# Patient Record
Sex: Female | Born: 1990 | Race: Black or African American | Hispanic: No | Marital: Single | Smoking: Current some day smoker
Health system: Southern US, Community
[De-identification: ages and names within clinical notes are randomized; demographics above are authoritative.]

## PROBLEM LIST (undated history)

## (undated) ENCOUNTER — Emergency Department: Admission: EM | Payer: Medicaid Other

## (undated) DIAGNOSIS — F319 Bipolar disorder, unspecified: Secondary | ICD-10-CM

## (undated) DIAGNOSIS — G4730 Sleep apnea, unspecified: Secondary | ICD-10-CM

## (undated) DIAGNOSIS — I471 Supraventricular tachycardia, unspecified: Secondary | ICD-10-CM

## (undated) DIAGNOSIS — F32A Depression, unspecified: Secondary | ICD-10-CM

## (undated) DIAGNOSIS — D649 Anemia, unspecified: Secondary | ICD-10-CM

## (undated) DIAGNOSIS — S92341K Displaced fracture of fourth metatarsal bone, right foot, subsequent encounter for fracture with nonunion: Secondary | ICD-10-CM

## (undated) DIAGNOSIS — F419 Anxiety disorder, unspecified: Secondary | ICD-10-CM

## (undated) DIAGNOSIS — R42 Dizziness and giddiness: Secondary | ICD-10-CM

## (undated) DIAGNOSIS — G43909 Migraine, unspecified, not intractable, without status migrainosus: Secondary | ICD-10-CM

## (undated) DIAGNOSIS — R110 Nausea: Secondary | ICD-10-CM

## (undated) DIAGNOSIS — I219 Acute myocardial infarction, unspecified: Secondary | ICD-10-CM

## (undated) DIAGNOSIS — K625 Hemorrhage of anus and rectum: Secondary | ICD-10-CM

## (undated) DIAGNOSIS — K219 Gastro-esophageal reflux disease without esophagitis: Secondary | ICD-10-CM

## (undated) DIAGNOSIS — T884XXA Failed or difficult intubation, initial encounter: Secondary | ICD-10-CM

## (undated) DIAGNOSIS — R55 Syncope and collapse: Secondary | ICD-10-CM

## (undated) HISTORY — DX: Hemorrhage of anus and rectum: K62.5

## (undated) HISTORY — DX: Syncope and collapse: R55

## (undated) HISTORY — DX: Sleep apnea, unspecified: G47.30

## (undated) HISTORY — DX: Supraventricular tachycardia, unspecified: I47.10

## (undated) HISTORY — DX: Nausea: R11.0

## (undated) HISTORY — DX: Migraine, unspecified, not intractable, without status migrainosus: G43.909

## (undated) HISTORY — PX: TONSILLECTOMY: SUR1361

## (undated) HISTORY — DX: Dizziness and giddiness: R42

## (undated) HISTORY — PX: WISDOM TOOTH EXTRACTION: SHX21

## (undated) HISTORY — DX: Acute myocardial infarction, unspecified: I21.9

---

## 2018-04-25 DIAGNOSIS — G43909 Migraine, unspecified, not intractable, without status migrainosus: Secondary | ICD-10-CM | POA: Insufficient documentation

## 2018-04-25 DIAGNOSIS — F418 Other specified anxiety disorders: Secondary | ICD-10-CM | POA: Insufficient documentation

## 2018-04-25 DIAGNOSIS — Z6841 Body Mass Index (BMI) 40.0 and over, adult: Secondary | ICD-10-CM | POA: Insufficient documentation

## 2018-04-25 LAB — HM HIV SCREENING LAB: HM HIV Screening: NEGATIVE

## 2018-04-27 LAB — HM PAP SMEAR

## 2018-06-12 ENCOUNTER — Encounter: Payer: Self-pay | Admitting: Emergency Medicine

## 2018-06-12 ENCOUNTER — Other Ambulatory Visit: Payer: Self-pay

## 2018-06-12 ENCOUNTER — Emergency Department
Admission: EM | Admit: 2018-06-12 | Discharge: 2018-06-12 | Disposition: A | Discharge status: Home / Self Care | Payer: Medicaid Other | Attending: Emergency Medicine | Admitting: Emergency Medicine

## 2018-06-12 ENCOUNTER — Emergency Department: Payer: Medicaid Other

## 2018-06-12 DIAGNOSIS — O9989 Other specified diseases and conditions complicating pregnancy, childbirth and the puerperium: Secondary | ICD-10-CM | POA: Insufficient documentation

## 2018-06-12 DIAGNOSIS — R51 Headache: Secondary | ICD-10-CM | POA: Diagnosis not present

## 2018-06-12 DIAGNOSIS — R109 Unspecified abdominal pain: Secondary | ICD-10-CM | POA: Diagnosis not present

## 2018-06-12 DIAGNOSIS — R1010 Upper abdominal pain, unspecified: Secondary | ICD-10-CM

## 2018-06-12 DIAGNOSIS — Z3A01 Less than 8 weeks gestation of pregnancy: Secondary | ICD-10-CM | POA: Insufficient documentation

## 2018-06-12 DIAGNOSIS — F17210 Nicotine dependence, cigarettes, uncomplicated: Secondary | ICD-10-CM | POA: Insufficient documentation

## 2018-06-12 DIAGNOSIS — O99331 Smoking (tobacco) complicating pregnancy, first trimester: Secondary | ICD-10-CM | POA: Insufficient documentation

## 2018-06-12 DIAGNOSIS — R519 Headache, unspecified: Secondary | ICD-10-CM

## 2018-06-12 DIAGNOSIS — Z3491 Encounter for supervision of normal pregnancy, unspecified, first trimester: Secondary | ICD-10-CM

## 2018-06-12 LAB — COMPREHENSIVE METABOLIC PANEL
ALBUMIN: 3.6 g/dL (ref 3.5–5.0)
ALK PHOS: 71 U/L (ref 38–126)
ALT: 17 U/L (ref 0–44)
ANION GAP: 8 (ref 5–15)
AST: 20 U/L (ref 15–41)
BUN: 11 mg/dL (ref 6–20)
CO2: 26 mmol/L (ref 22–32)
Calcium: 8.6 mg/dL — ABNORMAL LOW (ref 8.9–10.3)
Chloride: 105 mmol/L (ref 98–111)
Creatinine, Ser: 0.83 mg/dL (ref 0.44–1.00)
GFR calc non Af Amer: >60 mL/min (ref >60)
GLUCOSE: 109 mg/dL — AB (ref 70–99)
Potassium: 3.5 mmol/L (ref 3.5–5.1)
Sodium: 139 mmol/L (ref 135–145)
TOTAL PROTEIN: 7.5 g/dL (ref 6.5–8.1)
Total Bilirubin: 0.5 mg/dL (ref 0.3–1.2)

## 2018-06-12 LAB — CBC WITH DIFFERENTIAL/PLATELET
Abs Immature Granulocytes: 0.03 10*3/uL (ref 0.00–0.07)
Basophils Absolute: 0.1 10*3/uL (ref 0.0–0.1)
Basophils Relative: 1 %
EOS PCT: 1 %
Eosinophils Absolute: 0.1 10*3/uL (ref 0.0–0.5)
HEMATOCRIT: 38.4 % (ref 36.0–46.0)
HEMOGLOBIN: 12.4 g/dL (ref 12.0–15.0)
Immature Granulocytes: 0 %
LYMPHS ABS: 2.9 10*3/uL (ref 0.7–4.0)
LYMPHS PCT: 39 %
MCH: 29 pg (ref 26.0–34.0)
MCHC: 32.3 g/dL (ref 30.0–36.0)
MCV: 89.7 fL (ref 80.0–100.0)
MONO ABS: 0.4 10*3/uL (ref 0.1–1.0)
Monocytes Relative: 6 %
Neutro Abs: 4 10*3/uL (ref 1.7–7.7)
Neutrophils Relative %: 53 %
Platelets: 334 10*3/uL (ref 150–400)
RBC: 4.28 MIL/uL (ref 3.87–5.11)
RDW: 14.6 % (ref 11.5–15.5)
WBC: 7.5 10*3/uL (ref 4.0–10.5)
nRBC: 0 % (ref 0.0–0.2)

## 2018-06-12 LAB — URINALYSIS, COMPLETE (UACMP) WITH MICROSCOPIC
Bacteria, UA: NONE SEEN
Bilirubin Urine: NEGATIVE
GLUCOSE, UA: NEGATIVE mg/dL
Hgb urine dipstick: NEGATIVE
Ketones, ur: NEGATIVE mg/dL
Leukocytes, UA: NEGATIVE
Nitrite: NEGATIVE
PROTEIN: NEGATIVE mg/dL
Specific Gravity, Urine: 1.029 (ref 1.005–1.030)
pH: 5 (ref 5.0–8.0)

## 2018-06-12 LAB — HCG, QUANTITATIVE, PREGNANCY: hCG, Beta Chain, Quant, S: 1574 m[IU]/mL — ABNORMAL HIGH (ref <5)

## 2018-06-12 LAB — POCT PREGNANCY, URINE: Preg Test, Ur: POSITIVE — AB

## 2018-06-12 LAB — LIPASE, BLOOD: Lipase: 23 U/L (ref 11–51)

## 2018-06-12 MED ORDER — METOCLOPRAMIDE HCL 10 MG PO TABS: 10.0000 mg | ORAL_TABLET | Freq: Three times a day (TID) | ORAL | 1 refills | Status: DC | PRN

## 2018-06-12 MED ORDER — RANITIDINE HCL 150 MG PO TABS: 150.0000 mg | ORAL_TABLET | Freq: Two times a day (BID) | ORAL | 1 refills | Status: DC

## 2018-06-12 MED ORDER — METOCLOPRAMIDE HCL 5 MG/ML IJ SOLN
10.0000 mg | Freq: Once | INTRAMUSCULAR | Status: AC
  Administered 2018-06-12: 10 mg via INTRAVENOUS
  Filled 2018-06-12: qty 2

## 2018-06-12 MED ORDER — SODIUM CHLORIDE 0.9 % IV SOLN
Freq: Once | INTRAVENOUS | Status: AC
  Administered 2018-06-12: 11:00:00 via INTRAVENOUS

## 2018-06-12 NOTE — ED Provider Notes (Addendum)
[facility], [facility] Emergency Department Provider Note       Time seen: ----------------------------------------- 10:07 AM on 06/12/2018 -----------------------------------------   I have reviewed the triage vital signs and the nursing notes.  HISTORY   Chief Complaint Abdominal Pain and Emesis    HPI Maria Espinoza is a 27 y.o. female with a history of tonsillectomy who presents to the ED for abdominal cramping.  Patient reports abdominal cramping and headache for the past 3 days.  She denies nausea, vomiting or diarrhea.  She denies any urinary symptoms, has reported a small amount of vaginal discharge.  Currently takes birth control.  Patient reports a history of migraine headaches, she has had 5 previous pregnancies.  History reviewed. No pertinent past medical history.  There are no active problems to display for this patient.   Past Surgical History:  Procedure Laterality Date  . TONSILLECTOMY      Allergies Latex  Social History Social History   Tobacco Use  . Smoking status: Current Every Day Smoker    Packs/day: 0.50  . Smokeless tobacco: Never Used  Substance Use Topics  . Alcohol use: Yes    Comment: occasional  . Drug use: Never   Review of Systems Constitutional: Negative for fever. Cardiovascular: Negative for chest pain. Respiratory: Negative for shortness of breath. Gastrointestinal: Positive for abdominal cramping Musculoskeletal: Negative for back pain. Skin: Negative for rash. Neurological: Positive for headache  All systems negative/normal/unremarkable except as stated in the HPI  ____________________________________________   PHYSICAL EXAM:  VITAL SIGNS: ED Triage Vitals  Enc Vitals Group     BP 06/12/18 0943 134/70     Pulse Rate 06/12/18 0943 92     Resp 06/12/18 0943 16     Temp 06/12/18 0943 99.1 F (37.3 C)     Temp Source 06/12/18 0943 Oral     SpO2 06/12/18 0943 100 %     Weight 06/12/18 0944 280 lb  (127 kg)     Height 06/12/18 0944 5\' 3"  (1.6 m)     Head Circumference --      Peak Flow --      Pain Score 06/12/18 0944 7     Pain Loc --      Pain Edu? --      Excl. in GC? --    Constitutional: Alert and oriented. Well appearing and in no distress. Eyes: Conjunctivae are normal. Normal extraocular movements. ENT   Head: Normocephalic and atraumatic.   Nose: No congestion/rhinnorhea.   Mouth/Throat: Mucous membranes are moist.   Neck: No stridor. Cardiovascular: Normal rate, regular rhythm. No murmurs, rubs, or gallops. Respiratory: Normal respiratory effort without tachypnea nor retractions. Breath sounds are clear and equal bilaterally. No wheezes/rales/rhonchi. Gastrointestinal: Soft and nontender. Normal bowel sounds Musculoskeletal: Nontender with normal range of motion in extremities. No lower extremity tenderness nor edema. Neurologic:  Normal speech and language. No gross focal neurologic deficits are appreciated.  Skin:  Skin is warm, dry and intact. No rash noted. Psychiatric: Mood and affect are normal. Speech and behavior are normal.  ____________________________________________  ED COURSE:  As part of my medical decision making, I reviewed the following data within the electronic MEDICAL RECORD NUMBER History obtained from family if available, nursing notes, old chart and ekg, as well as notes from prior ED visits. Patient presented for abdominal cramping as well as headache, we will assess with labs and imaging as indicated at this time.   Procedures ____________________________________________   LABS (pertinent positives/negatives)  Labs Reviewed  COMPREHENSIVE METABOLIC PANEL - Abnormal; Notable for the following components:      Result Value   Glucose, Bld 109 (*)    Calcium 8.6 (*)    All other components within normal limits  HCG, QUANTITATIVE, PREGNANCY - Abnormal; Notable for the following components:   hCG, Beta Chain, Quant, S 1,574 (*)     All other components within normal limits  URINALYSIS, COMPLETE (UACMP) WITH MICROSCOPIC - Abnormal; Notable for the following components:   Color, Urine YELLOW (*)    APPearance CLEAR (*)    All other components within normal limits  POCT PREGNANCY, URINE - Abnormal; Notable for the following components:   Preg Test, Ur POSITIVE (*)    All other components within normal limits  CBC WITH DIFFERENTIAL/PLATELET  LIPASE, BLOOD    RADIOLOGY Images were viewed by me  Pregnancy ultrasound IMPRESSION: 1. Probable early intrauterine gestational sac, but no yolk sac, fetal pole, or cardiac activity yet visualized. Recommend follow-up quantitative B-HCG levels and follow-up US in 14 days to assess viability. This recommendation follows SRU consensus guidelines: Diagnostic Criteria for Nonviable Pregnancy Early in the First Trimester. Malva Limes Med 2013; 369:1443-51. ____________________________________________  DIFFERENTIAL DIAGNOSIS   Ectopic pregnancy, threatened miscarriage, UTI, migraine, tension headache, normal pregnancy  FINAL ASSESSMENT AND PLAN  Abdominal pain, headache, first trimester pregnancy   Plan: The patient had presented for abdominal pain and headache in early pregnancy. Patient's labs did indicate early pregnancy. Patient's imaging revealed a likely 5-week pregnancy but no yolk sac was evident yet.  We have advised follow-up in 2 weeks for repeat ultrasound.  Patient will be prescribed antacids as well as antiemetics that are safe in pregnancy.   Ulice Dash, MD   Note: This note was generated in part or whole with voice recognition software. Voice recognition is usually quite accurate but there are transcription errors that can and very often do occur. I apologize for any typographical errors that were not detected and corrected.     Emily Filbert, MD 06/12/18 1215    Emily Filbert, MD 06/12/18 1215

## 2018-06-12 NOTE — ED Notes (Signed)
Patient transported to Ultrasound 

## 2018-06-12 NOTE — ED Triage Notes (Signed)
Patient reports lower abdominal cramping and headache x3 days. Denies N/V/D. Denies any urinary symptoms. Reports small amount of vaginal discharge.

## 2018-06-12 NOTE — ED Notes (Signed)

## 2018-07-24 DIAGNOSIS — F319 Bipolar disorder, unspecified: Secondary | ICD-10-CM | POA: Insufficient documentation

## 2018-08-29 DIAGNOSIS — F418 Other specified anxiety disorders: Secondary | ICD-10-CM

## 2018-09-30 ENCOUNTER — Inpatient Hospital Stay
Admission: EM | Admit: 2018-09-30 | Discharge: 2018-10-03 | DRG: 917 | Disposition: A | Discharge status: Other Acute Inpatient Hospital | Payer: Medicaid Other | Attending: Internal Medicine | Admitting: Internal Medicine

## 2018-09-30 ENCOUNTER — Other Ambulatory Visit: Payer: Self-pay

## 2018-09-30 DIAGNOSIS — Z23 Encounter for immunization: Secondary | ICD-10-CM

## 2018-09-30 DIAGNOSIS — Z91040 Latex allergy status: Secondary | ICD-10-CM

## 2018-09-30 DIAGNOSIS — J9601 Acute respiratory failure with hypoxia: Secondary | ICD-10-CM | POA: Diagnosis present

## 2018-09-30 DIAGNOSIS — R4182 Altered mental status, unspecified: Secondary | ICD-10-CM | POA: Diagnosis present

## 2018-09-30 DIAGNOSIS — Z818 Family history of other mental and behavioral disorders: Secondary | ICD-10-CM

## 2018-09-30 DIAGNOSIS — T50902A Poisoning by unspecified drugs, medicaments and biological substances, intentional self-harm, initial encounter: Secondary | ICD-10-CM

## 2018-09-30 DIAGNOSIS — J9600 Acute respiratory failure, unspecified whether with hypoxia or hypercapnia: Secondary | ICD-10-CM

## 2018-09-30 DIAGNOSIS — Z7902 Long term (current) use of antithrombotics/antiplatelets: Secondary | ICD-10-CM

## 2018-09-30 DIAGNOSIS — Z793 Long term (current) use of hormonal contraceptives: Secondary | ICD-10-CM

## 2018-09-30 DIAGNOSIS — Z4659 Encounter for fitting and adjustment of other gastrointestinal appliance and device: Secondary | ICD-10-CM

## 2018-09-30 DIAGNOSIS — Z6841 Body Mass Index (BMI) 40.0 and over, adult: Secondary | ICD-10-CM

## 2018-09-30 DIAGNOSIS — F419 Anxiety disorder, unspecified: Secondary | ICD-10-CM | POA: Diagnosis present

## 2018-09-30 DIAGNOSIS — Z915 Personal history of self-harm: Secondary | ICD-10-CM

## 2018-09-30 DIAGNOSIS — T43012A Poisoning by tricyclic antidepressants, intentional self-harm, initial encounter: Principal | ICD-10-CM | POA: Diagnosis present

## 2018-09-30 DIAGNOSIS — Z7982 Long term (current) use of aspirin: Secondary | ICD-10-CM

## 2018-09-30 DIAGNOSIS — E876 Hypokalemia: Secondary | ICD-10-CM | POA: Diagnosis present

## 2018-09-30 DIAGNOSIS — T1491XA Suicide attempt, initial encounter: Secondary | ICD-10-CM

## 2018-09-30 DIAGNOSIS — Z79899 Other long term (current) drug therapy: Secondary | ICD-10-CM

## 2018-09-30 DIAGNOSIS — F3130 Bipolar disorder, current episode depressed, mild or moderate severity, unspecified: Secondary | ICD-10-CM | POA: Diagnosis present

## 2018-09-30 DIAGNOSIS — Z794 Long term (current) use of insulin: Secondary | ICD-10-CM

## 2018-09-30 DIAGNOSIS — F17210 Nicotine dependence, cigarettes, uncomplicated: Secondary | ICD-10-CM | POA: Diagnosis present

## 2018-09-30 DIAGNOSIS — Z9911 Dependence on respirator [ventilator] status: Secondary | ICD-10-CM

## 2018-09-30 HISTORY — DX: Bipolar disorder, unspecified: F31.9

## 2018-09-30 HISTORY — DX: Poisoning by unspecified drugs, medicaments and biological substances, intentional self-harm, initial encounter: T50.902A

## 2018-09-30 LAB — ETHANOL: Alcohol, Ethyl (B): <10 mg/dL

## 2018-09-30 LAB — ACETAMINOPHEN LEVEL
Acetaminophen (Tylenol), Serum: <10 ug/mL — ABNORMAL LOW (ref 10–30)
Acetaminophen (Tylenol), Serum: <10 ug/mL — ABNORMAL LOW (ref 10–30)

## 2018-09-30 LAB — COMPREHENSIVE METABOLIC PANEL WITH GFR
ALT: 13 U/L (ref 0–44)
AST: 19 U/L (ref 15–41)
Albumin: 3.8 g/dL (ref 3.5–5.0)
Alkaline Phosphatase: 80 U/L (ref 38–126)
Anion gap: 9 (ref 5–15)
BUN: 11 mg/dL (ref 6–20)
CO2: 24 mmol/L (ref 22–32)
Calcium: 9 mg/dL (ref 8.9–10.3)
Chloride: 104 mmol/L (ref 98–111)
Creatinine, Ser: 0.84 mg/dL (ref 0.44–1.00)
GFR calc Af Amer: >60 mL/min
GFR calc non Af Amer: >60 mL/min
Glucose, Bld: 89 mg/dL (ref 70–99)
Potassium: 3.5 mmol/L (ref 3.5–5.1)
Sodium: 137 mmol/L (ref 135–145)
Total Bilirubin: 0.6 mg/dL (ref 0.3–1.2)
Total Protein: 8 g/dL (ref 6.5–8.1)

## 2018-09-30 LAB — CBC
HCT: 40.5 % (ref 36.0–46.0)
Hemoglobin: 13.2 g/dL (ref 12.0–15.0)
MCH: 29.1 pg (ref 26.0–34.0)
MCHC: 32.6 g/dL (ref 30.0–36.0)
MCV: 89.2 fL (ref 80.0–100.0)
Platelets: 336 K/uL (ref 150–400)
RBC: 4.54 MIL/uL (ref 3.87–5.11)
RDW: 13.7 % (ref 11.5–15.5)
WBC: 7.3 K/uL (ref 4.0–10.5)
nRBC: 0 % (ref 0.0–0.2)

## 2018-09-30 LAB — SALICYLATE LEVEL: Salicylate Lvl: <7 mg/dL (ref 2.8–30.0)

## 2018-09-30 MED ORDER — PROPOFOL 1000 MG/100ML IV EMUL
5.0000 ug/kg/min | INTRAVENOUS | Status: DC
  Administered 2018-10-01: 5 ug/kg/min via INTRAVENOUS
  Administered 2018-10-01: 35 ug/kg/min via INTRAVENOUS
  Administered 2018-10-01: 30 ug/kg/min via INTRAVENOUS
  Filled 2018-09-30 (×3): qty 100

## 2018-09-30 MED ORDER — ETOMIDATE 2 MG/ML IV SOLN
20.0000 mg | Freq: Once | INTRAVENOUS | Status: AC
  Administered 2018-09-30: 20 mg via INTRAVENOUS

## 2018-09-30 MED ORDER — SUCCINYLCHOLINE CHLORIDE 20 MG/ML IJ SOLN
190.0000 mg | Freq: Once | INTRAMUSCULAR | Status: AC
  Administered 2018-09-30: 190 mg via INTRAVENOUS

## 2018-09-30 MED ORDER — FENTANYL 2500MCG IN NS 250ML (10MCG/ML) PREMIX INFUSION
100.0000 ug/h | INTRAVENOUS | Status: DC
  Administered 2018-10-01: 100 ug/h via INTRAVENOUS
  Filled 2018-09-30: qty 250

## 2018-09-30 NOTE — ED Notes (Signed)
Pt sitting on floor again on knees, additional staff, dee, rn and security at bedside. Pt assisted back to bed.

## 2018-09-30 NOTE — ED Provider Notes (Signed)
[facility] Emergency Department Provider Note  ____________________________________________   I have reviewed the triage vital signs and the nursing notes. Where available I have reviewed prior notes and, if possible and indicated, outside hospital notes.    HISTORY  Chief Complaint Drug Overdose    HPI Maria Espinoza is a 28 y.o. female  With history of bipolar disorder depression, and obesity states that she took "a bottle" of doxepin to kill herself.  Denies any coingestions with Tylenol specifically.  Denies S, no other complaints.  Not being sexually or physically abused at home, will not tell me why she is been so depressed.  IVC paperwork apparently was taken out prior to arrival.  We did talk to poison control, they need to have 6 hours of observation and they would like a repeat Tylenol level which we have ordered.  Patient's initial Tylenol level is in process.  She did this between 7 and 8:00 she states.  No Vomiting no other complaints   History reviewed. No pertinent past medical history.  Patient Active Problem List   Diagnosis Date Noted  . Bipolar disorder, unspecified (HCC) 07/24/2018  . Depression with anxiety 04/25/2018  . Migraines 04/25/2018  . Morbid obesity (HCC) 04/25/2018    Past Surgical History:  Procedure Laterality Date  . TONSILLECTOMY      Prior to Admission medications   Medication Sig Start Date End Date Taking? Authorizing Provider  JENCYCLA 0.35 MG tablet Take 1 tablet by mouth daily. as directed 05/06/18   [provider]  levonorgestrel (MIRENA) 20 MCG/24HR IUD 1 each by Intrauterine route once. 08/17/18   [provider]    Allergies Latex  Family History  Problem Relation Age of Onset  . Migraines Father   . Heart disease Maternal Grandmother   . Depression Maternal Grandmother   . Hypertension Maternal Grandmother   . Breast cancer Paternal Grandfather     Social History Social  History   Tobacco Use  . Smoking status: Current Every Day Smoker    Packs/day: 0.50  . Smokeless tobacco: Never Used  Substance Use Topics  . Alcohol use: Yes    Comment: occasional  . Drug use: Never    Review of Systems Constitutional: No fever/chills Eyes: No visual changes. ENT: No sore throat. No stiff neck no neck pain Cardiovascular: Denies chest pain. Respiratory: Denies shortness of breath. Gastrointestinal:   no vomiting.  No diarrhea.  No constipation. Genitourinary: Negative for dysuria. Musculoskeletal: Negative lower extremity swelling Skin: Negative for rash. Neurological: Negative for severe headaches, focal weakness or numbness.   ____________________________________________   PHYSICAL EXAM:  VITAL SIGNS: ED Triage Vitals  Enc Vitals Group     BP 09/30/18 2206 (!) 145/89     Pulse Rate 09/30/18 2210 (!) 56     Resp 09/30/18 2210 12     Temp 09/30/18 2212 98.4 F (36.9 C)     Temp Source 09/30/18 2212 Oral     SpO2 09/30/18 2210 99 %     Weight 09/30/18 2210 280 lb (127 kg)     Height 09/30/18 2210  (1.676 m)     Head Circumference --      Peak Flow --      Pain Score 09/30/18 2210 0     Pain Loc --      Pain Edu? --      Excl. in GC? --     Constitutional: Somnolent but she is easily arousable and oriented.  Well appearing and in no acute distress. Eyes: Conjunctivae are normal Head: Atraumatic HEENT: No congestion/rhinnorhea. Mucous membranes are moist.  Oropharynx non-erythematous Neck:   Nontender with no meningismus, no masses, no stridor Cardiovascular: Normal rate, regular rhythm. Grossly normal heart sounds.  Good peripheral circulation. Respiratory: Normal respiratory effort.  No retractions. Lungs CTAB. Abdominal: Soft and nontender. No distention. No guarding no rebound Back:  There is no focal tenderness or step off.  there is no midline tenderness there are no lesions noted. there is no CVA tenderness Musculoskeletal: No  lower extremity tenderness, no upper extremity tenderness. No joint effusions, no DVT signs strong distal pulses no edema Neurologic:  Normal speech and language. No gross focal neurologic deficits are appreciated.  Skin:  Skin is warm, dry and intact. No rash noted. Psychiatric: Mood and affect are depressed. Speech and behavior are normal.  ____________________________________________   LABS (all labs ordered are listed, but only abnormal results are displayed)  Labs Reviewed  ETHANOL  CBC  COMPREHENSIVE METABOLIC PANEL  SALICYLATE LEVEL  ACETAMINOPHEN LEVEL  URINE DRUG SCREEN, QUALITATIVE ([facility] ONLY)  CBG MONITORING, ED  POC URINE PREG, ED    Pertinent labs  results that were available during my care of the patient were reviewed by me and considered in my medical decision making (see chart for details). ____________________________________________  EKG  I personally interpreted any EKGs ordered by me or triage Sinus rate 60, PR is prolonged RSR prime noted no ST elevation depression or ischemic changes normal axis, QTC 447 ____________________________________________  RADIOLOGY  Pertinent labs & imaging results that were available during my care of the patient were reviewed by me and considered in my medical decision making (see chart for details). If possible, patient and/or family made aware of any abnormal findings.  No results found. ____________________________________________    PROCEDURES  Procedure(s) performed: None  Procedures  Critical Care performed: CRITICAL CARE Performed by: Jeanmarie Plant   Total critical care time: 45 minutes  Critical care time was exclusive of separately billable procedures and treating other patients.  Critical care was necessary to treat or prevent imminent or life-threatening deterioration.  Critical care was time spent personally by me on the following activities: development of treatment plan with patient and/or  surrogate as well as nursing, discussions with consultants, evaluation of patient's response to treatment, examination of patient, obtaining history from patient or surrogate, ordering and performing treatments and interventions, ordering and review of laboratory studies, ordering and review of radiographic studies, pulse oximetry and re-evaluation of patient's condition.   ____________________________________________   INITIAL IMPRESSION / ASSESSMENT AND PLAN / ED COURSE  Pertinent labs & imaging results that were available during my care of the patient were reviewed by me and considered in my medical decision making (see chart for details).  Patient here with an overdose, she is somnolent but easily arousable and speaks with me with no difficulty without sternal rub or other intervention.  See nurse note regarding conversation with poison control.  We will resend a Tylenol for 4-hour level, we will observe her and give her supportive care as indicated for doxepin.  I have completed IVC paperwork on this patient.  We have a sitter with her.  She is on the monitor.    ____________________________________________   FINAL CLINICAL IMPRESSION(S) / ED DIAGNOSES  Final diagnoses:  None      This chart was dictated using voice recognition software.  Despite best efforts to proofread,  errors can occur which  can change meaning.      Jeanmarie Plant, MD 09/30/18 2253

## 2018-09-30 NOTE — ED Provider Notes (Signed)
----------------------------------------- 11:27 PM on 09/30/2018 -----------------------------------------   Assuming care from Dr. Alphonzo Lemmings.  In short, Maria Espinoza is a 28 y.o. female with a chief complaint of overdose of doxepin.  Refer to the original H&P for additional details.  The patient's acetaminophen and salicylate levels are negative.  We will recheck an acetaminophen level in about an hour which would be approximately 4 hours postingestion.  After 4 to 6 hours she should be medically cleared and appropriate for psychiatric disposition.    ----------------------------------------- 12:07 AM on 10/01/2018 -----------------------------------------  The patient decompensated abruptly.  She was no longer responding and was choking and gagging on her own secretions.  She was not protecting her airway and was no longer responding to painful stimuli.  She required a jaw thrust by ED staff while we prepared for RSI.  I intubated her successfully and without complication.  She is on fentanyl infusion and propofol infusion.  Tube placement confirmed by direct visualization, end-tidal CO2 detection, and auscultation and chest x-ray is pending.  I have paged the hospitalist to discuss admission.   .Critical Care Performed by: Loleta Rose, MD Authorized by: Loleta Rose, MD   Critical care provider statement:    Critical care time (minutes):  30   Critical care time was exclusive of:  Separately billable procedures and treating other patients   Critical care was necessary to treat or prevent imminent or life-threatening deterioration of the following conditions:  Respiratory failure and toxidrome   Critical care was time spent personally by me on the following activities:  Development of treatment plan with patient or surrogate, discussions with consultants, evaluation of patient's response to treatment, examination of patient, obtaining history from patient or surrogate, ordering and  performing treatments and interventions, ordering and review of laboratory studies, ordering and review of radiographic studies, pulse oximetry, re-evaluation of patient's condition and review of old charts Procedure Name: Intubation Date/Time: 10/01/2018 12:08 AM Performed by: Loleta Rose, MD Pre-anesthesia Checklist: Patient identified, Emergency Drugs available, Suction available and Patient being monitored Preoxygenation: Pre-oxygenation with 100% oxygen Induction Type: IV induction and Rapid sequence Laryngoscope Size: Glidescope and 3 Tube size: 7.5 mm Number of attempts: 1 Placement Confirmation: ETT inserted through vocal cords under direct vision,  CO2 detector and Breath sounds checked- equal and bilateral Secured at: 22 cm Tube secured with: ETT holder Dental Injury: Teeth and Oropharynx as per pre-operative assessment        ----------------------------------------- 12:13 AM on 10/01/2018 -----------------------------------------  I spoke by phone with Dr. Marjie Skiff with the hospitalist service.  He will admit.  Patient hemodynamically stable at this time.  I reviewed the chest x-ray post intubation and it is just barely above the carina.  I have asked the respiratory therapist to back the tube out by 1 cm.  The interpretation of the radiologist also suggest that needs to be backed out slightly because it is only 7 mm above the carina.  ED ECG REPORT I, Loleta Rose, the attending physician, personally viewed and interpreted this ECG.  Date: 10/01/2018 EKG Time: 00: 20 Rate: 123 Rhythm: Sinus tachycardia QRS Axis: normal Intervals: normal ST/T Wave abnormalities: normal Narrative Interpretation: no evidence of acute ischemia   ED ECG REPORT #2 I, Loleta Rose, the attending physician, personally viewed and interpreted this ECG.  Date: 10/01/2018 EKG Time: 00:42 Rate: 106 Rhythm: sinus tachycardia QRS Axis: normal Intervals: normal ST/T Wave  abnormalities: normal Narrative Interpretation: no evidence of acute ischemia  ABG reassuring, slight acid-base deficit but no  significant abnormality and no evidence of metabolic acidosis.    Loleta Rose, MD 10/01/18 [phone]

## 2018-09-30 NOTE — ED Notes (Signed)
Officer at bedside.

## 2018-09-30 NOTE — ED Triage Notes (Signed)
Pt coems via EMS from home after taking about 30 doxepin 50mg  tablets around 7pm or 8pm. Pt BP was 125/74 with EMS, HR 60, and 100% on RA. PT is arousable with voice and answers questions slowly and with broken speech. Pt does endorse SI.

## 2018-09-30 NOTE — ED Notes (Signed)
Sitter will remain with pt per Dr. Alphonzo Lemmings.

## 2018-09-30 NOTE — ED Notes (Signed)
Allyson from Poison control said minimum obs time is 6 hours for doxepin. MD notified.

## 2018-09-30 NOTE — ED Notes (Signed)
Sitter at bedside.

## 2018-09-30 NOTE — ED Notes (Addendum)
Pt very drosy, gagging, nol longer following commands. Pt sitting up at 90 degree angle with assist of staff of 3. MD to bedside.

## 2018-09-30 NOTE — ED Notes (Addendum)
Pt changed into burgandy scrubs, placed back on cardiac monitor, cont pos. Pt continually attempting to remove iv. Pt with eyes open, does not follow commands reliably, but does at times.

## 2018-09-30 NOTE — ED Notes (Addendum)
Pt sitting on floor, pt assisted back to bed, charge RN notified will need a sitter for pt. No sitter currently in room.

## 2018-09-30 NOTE — ED Notes (Signed)
Poison control note: tylenol level again at 11pm, potential for seizures (benzos), potential for hypotension (fluids), potential for ekg changes, pulmonary edema. MD made aware of suggestions.

## 2018-10-01 ENCOUNTER — Inpatient Hospital Stay: Payer: Medicaid Other

## 2018-10-01 ENCOUNTER — Encounter: Payer: Self-pay | Admitting: Pulmonary Disease

## 2018-10-01 ENCOUNTER — Other Ambulatory Visit: Payer: Self-pay

## 2018-10-01 ENCOUNTER — Emergency Department: Payer: Medicaid Other

## 2018-10-01 ENCOUNTER — Encounter: Payer: Self-pay | Admitting: Internal Medicine

## 2018-10-01 DIAGNOSIS — Z6841 Body Mass Index (BMI) 40.0 and over, adult: Secondary | ICD-10-CM | POA: Diagnosis not present

## 2018-10-01 DIAGNOSIS — F17210 Nicotine dependence, cigarettes, uncomplicated: Secondary | ICD-10-CM | POA: Diagnosis present

## 2018-10-01 DIAGNOSIS — Z915 Personal history of self-harm: Secondary | ICD-10-CM | POA: Diagnosis not present

## 2018-10-01 DIAGNOSIS — E876 Hypokalemia: Secondary | ICD-10-CM | POA: Diagnosis present

## 2018-10-01 DIAGNOSIS — Z818 Family history of other mental and behavioral disorders: Secondary | ICD-10-CM | POA: Diagnosis not present

## 2018-10-01 DIAGNOSIS — F319 Bipolar disorder, unspecified: Secondary | ICD-10-CM | POA: Diagnosis not present

## 2018-10-01 DIAGNOSIS — Z793 Long term (current) use of hormonal contraceptives: Secondary | ICD-10-CM | POA: Diagnosis not present

## 2018-10-01 DIAGNOSIS — Z9911 Dependence on respirator [ventilator] status: Secondary | ICD-10-CM | POA: Diagnosis not present

## 2018-10-01 DIAGNOSIS — F3130 Bipolar disorder, current episode depressed, mild or moderate severity, unspecified: Secondary | ICD-10-CM | POA: Diagnosis present

## 2018-10-01 DIAGNOSIS — I4581 Long QT syndrome: Secondary | ICD-10-CM

## 2018-10-01 DIAGNOSIS — T1491XA Suicide attempt, initial encounter: Secondary | ICD-10-CM | POA: Diagnosis present

## 2018-10-01 DIAGNOSIS — R4182 Altered mental status, unspecified: Secondary | ICD-10-CM | POA: Diagnosis present

## 2018-10-01 DIAGNOSIS — Z794 Long term (current) use of insulin: Secondary | ICD-10-CM | POA: Diagnosis not present

## 2018-10-01 DIAGNOSIS — T43012A Poisoning by tricyclic antidepressants, intentional self-harm, initial encounter: Secondary | ICD-10-CM | POA: Diagnosis present

## 2018-10-01 DIAGNOSIS — F419 Anxiety disorder, unspecified: Secondary | ICD-10-CM | POA: Diagnosis present

## 2018-10-01 DIAGNOSIS — Z23 Encounter for immunization: Secondary | ICD-10-CM | POA: Diagnosis not present

## 2018-10-01 DIAGNOSIS — Z91040 Latex allergy status: Secondary | ICD-10-CM | POA: Diagnosis not present

## 2018-10-01 DIAGNOSIS — Z79899 Other long term (current) drug therapy: Secondary | ICD-10-CM | POA: Diagnosis not present

## 2018-10-01 DIAGNOSIS — Z7902 Long term (current) use of antithrombotics/antiplatelets: Secondary | ICD-10-CM | POA: Diagnosis not present

## 2018-10-01 DIAGNOSIS — Z7982 Long term (current) use of aspirin: Secondary | ICD-10-CM | POA: Diagnosis not present

## 2018-10-01 DIAGNOSIS — T43012S Poisoning by tricyclic antidepressants, intentional self-harm, sequela: Secondary | ICD-10-CM | POA: Diagnosis not present

## 2018-10-01 DIAGNOSIS — J9601 Acute respiratory failure with hypoxia: Secondary | ICD-10-CM | POA: Diagnosis present

## 2018-10-01 LAB — MRSA PCR SCREENING: MRSA by PCR: NEGATIVE

## 2018-10-01 LAB — BLOOD GAS, ARTERIAL
Acid-base deficit: 2.2 mmol/L — ABNORMAL HIGH (ref 0.0–2.0)
Bicarbonate: 22.4 mmol/L (ref 20.0–28.0)
FIO2: 0.5
MECHVT: 550 mL
O2 Saturation: 99.3 %
PEEP: 5 cmH2O
PH ART: 7.39 (ref 7.350–7.450)
Patient temperature: 37
RATE: 18 resp/min
pCO2 arterial: 37 mmHg (ref 32.0–48.0)
pO2, Arterial: 147 mmHg — ABNORMAL HIGH (ref 83.0–108.0)

## 2018-10-01 LAB — COMPREHENSIVE METABOLIC PANEL
ALT: 12 U/L (ref 0–44)
ANION GAP: 7 (ref 5–15)
AST: 18 U/L (ref 15–41)
Albumin: 3.1 g/dL — ABNORMAL LOW (ref 3.5–5.0)
Alkaline Phosphatase: 61 U/L (ref 38–126)
BUN: 9 mg/dL (ref 6–20)
CO2: 25 mmol/L (ref 22–32)
Calcium: 8 mg/dL — ABNORMAL LOW (ref 8.9–10.3)
Chloride: 107 mmol/L (ref 98–111)
Creatinine, Ser: 0.68 mg/dL (ref 0.44–1.00)
GFR calc Af Amer: >60 mL/min (ref >60)
GFR calc non Af Amer: >60 mL/min (ref >60)
Glucose, Bld: 111 mg/dL — ABNORMAL HIGH (ref 70–99)
Potassium: 3 mmol/L — ABNORMAL LOW (ref 3.5–5.1)
Sodium: 139 mmol/L (ref 135–145)
Total Bilirubin: 0.7 mg/dL (ref 0.3–1.2)
Total Protein: 6.2 g/dL — ABNORMAL LOW (ref 6.5–8.1)

## 2018-10-01 LAB — URINE DRUG SCREEN, QUALITATIVE (ARMC ONLY)
Amphetamines, Ur Screen: NOT DETECTED
BARBITURATES, UR SCREEN: NOT DETECTED
BENZODIAZEPINE, UR SCRN: NOT DETECTED
CANNABINOID 50 NG, UR ~~LOC~~: POSITIVE — AB
COCAINE METABOLITE, UR ~~LOC~~: NOT DETECTED
MDMA (Ecstasy)Ur Screen: NOT DETECTED
METHADONE SCREEN, URINE: NOT DETECTED
OPIATE, UR SCREEN: NOT DETECTED
Phencyclidine (PCP) Ur S: NOT DETECTED
TRICYCLIC, UR SCREEN: POSITIVE — AB

## 2018-10-01 LAB — BASIC METABOLIC PANEL
Anion gap: 11 (ref 5–15)
BUN: 7 mg/dL (ref 6–20)
CO2: 24 mmol/L (ref 22–32)
CREATININE: 0.82 mg/dL (ref 0.44–1.00)
Calcium: 8 mg/dL — ABNORMAL LOW (ref 8.9–10.3)
Chloride: 104 mmol/L (ref 98–111)
GFR calc Af Amer: >60 mL/min (ref >60)
GFR calc non Af Amer: >60 mL/min (ref >60)
Glucose, Bld: 122 mg/dL — ABNORMAL HIGH (ref 70–99)
Potassium: 3.8 mmol/L (ref 3.5–5.1)
Sodium: 139 mmol/L (ref 135–145)

## 2018-10-01 LAB — PHOSPHORUS
Phosphorus: 2.9 mg/dL (ref 2.5–4.6)
Phosphorus: 3.2 mg/dL (ref 2.5–4.6)

## 2018-10-01 LAB — GLUCOSE, CAPILLARY
GLUCOSE-CAPILLARY: 108 mg/dL — AB (ref 70–99)
Glucose-Capillary: 107 mg/dL — ABNORMAL HIGH (ref 70–99)

## 2018-10-01 LAB — MAGNESIUM
Magnesium: 1.9 mg/dL (ref 1.7–2.4)
Magnesium: 1.7 mg/dL (ref 1.7–2.4)

## 2018-10-01 LAB — POCT PREGNANCY, URINE: Preg Test, Ur: NEGATIVE

## 2018-10-01 MED ORDER — MAGNESIUM SULFATE 2 GM/50ML IV SOLN
2.0000 g | Freq: Once | INTRAVENOUS | Status: AC
  Administered 2018-10-01: 2 g via INTRAVENOUS
  Filled 2018-10-01: qty 50

## 2018-10-01 MED ORDER — BISACODYL 5 MG PO TBEC: 5.0000 mg | DELAYED_RELEASE_TABLET | Freq: Every day | ORAL | Status: DC | PRN

## 2018-10-01 MED ORDER — LORAZEPAM 2 MG/ML IJ SOLN: 2.0000 mg | INTRAMUSCULAR | Status: DC | PRN

## 2018-10-01 MED ORDER — STERILE WATER FOR INJECTION IV SOLN
INTRAVENOUS | Status: AC
  Administered 2018-10-01: 01:00:00 via INTRAVENOUS
  Filled 2018-10-01 (×2): qty 850

## 2018-10-01 MED ORDER — FOLIC ACID 1 MG PO TABS
1.0000 mg | ORAL_TABLET | Freq: Every day | ORAL | Status: DC
  Administered 2018-10-02 – 2018-10-03 (×2): 1 mg via ORAL
  Filled 2018-10-01 (×2): qty 1

## 2018-10-01 MED ORDER — FOLIC ACID 1 MG PO TABS
1.0000 mg | ORAL_TABLET | Freq: Once | ORAL | Status: AC
  Administered 2018-10-01: 1 mg
  Filled 2018-10-01: qty 1

## 2018-10-01 MED ORDER — THIAMINE HCL 100 MG/ML IJ SOLN
100.0000 mg | Freq: Once | INTRAMUSCULAR | Status: AC
  Administered 2018-10-01: 100 mg via INTRAVENOUS
  Filled 2018-10-01: qty 2

## 2018-10-01 MED ORDER — FOLIC ACID 5 MG/ML IJ SOLN
1.0000 mg | Freq: Once | INTRAMUSCULAR | Status: AC
  Administered 2018-10-01: 1 mg via INTRAVENOUS
  Filled 2018-10-01: qty 0.2

## 2018-10-01 MED ORDER — VITAMIN B-1 100 MG PO TABS
100.0000 mg | ORAL_TABLET | Freq: Once | ORAL | Status: AC
  Administered 2018-10-01: 100 mg
  Filled 2018-10-01: qty 1

## 2018-10-01 MED ORDER — VITAMIN B-1 100 MG PO TABS
100.0000 mg | ORAL_TABLET | Freq: Every day | ORAL | Status: DC
  Administered 2018-10-01 – 2018-10-03 (×3): 100 mg via ORAL
  Filled 2018-10-01 (×2): qty 1

## 2018-10-01 MED ORDER — SENNOSIDES-DOCUSATE SODIUM 8.6-50 MG PO TABS: 1.0000 | ORAL_TABLET | Freq: Every evening | ORAL | Status: DC | PRN

## 2018-10-01 MED ORDER — ADULT MULTIVITAMIN W/MINERALS CH
1.0000 | ORAL_TABLET | Freq: Every day | ORAL | Status: DC
  Administered 2018-10-01 – 2018-10-03 (×3): 1 via ORAL
  Filled 2018-10-01 (×2): qty 1

## 2018-10-01 MED ORDER — POTASSIUM CHLORIDE 20 MEQ PO PACK
40.0000 meq | PACK | ORAL | Status: DC
  Administered 2018-10-01: 40 meq
  Filled 2018-10-01: qty 2

## 2018-10-01 MED ORDER — ENOXAPARIN SODIUM 40 MG/0.4ML ~~LOC~~ SOLN
40.0000 mg | Freq: Two times a day (BID) | SUBCUTANEOUS | Status: DC
  Administered 2018-10-01 – 2018-10-03 (×6): 40 mg via SUBCUTANEOUS
  Filled 2018-10-01 (×6): qty 0.4

## 2018-10-01 MED ORDER — IPRATROPIUM-ALBUTEROL 0.5-2.5 (3) MG/3ML IN SOLN
3.0000 mL | Freq: Once | RESPIRATORY_TRACT | Status: AC
  Administered 2018-10-01: 3 mL via RESPIRATORY_TRACT
  Filled 2018-10-01: qty 3

## 2018-10-01 MED ORDER — ORAL CARE MOUTH RINSE
15.0000 mL | OROMUCOSAL | Status: DC
  Administered 2018-10-01 – 2018-10-02 (×9): 15 mL via OROMUCOSAL

## 2018-10-01 MED ORDER — ADULT MULTIVITAMIN LIQUID CH
15.0000 mL | Freq: Once | ORAL | Status: AC
  Administered 2018-10-01: 15 mL
  Filled 2018-10-01: qty 15

## 2018-10-01 MED ORDER — PANTOPRAZOLE SODIUM 40 MG IV SOLR
40.0000 mg | INTRAVENOUS | Status: DC
  Administered 2018-10-01: 40 mg via INTRAVENOUS
  Filled 2018-10-01: qty 40

## 2018-10-01 MED ORDER — IPRATROPIUM-ALBUTEROL 0.5-2.5 (3) MG/3ML IN SOLN: 3.0000 mL | RESPIRATORY_TRACT | Status: DC

## 2018-10-01 MED ORDER — IPRATROPIUM-ALBUTEROL 0.5-2.5 (3) MG/3ML IN SOLN
RESPIRATORY_TRACT | Status: AC
  Filled 2018-10-01: qty 3

## 2018-10-01 MED ORDER — CHLORHEXIDINE GLUCONATE 0.12% ORAL RINSE (MEDLINE KIT)
15.0000 mL | Freq: Two times a day (BID) | OROMUCOSAL | Status: DC
  Administered 2018-10-01: 15 mL via OROMUCOSAL

## 2018-10-01 MED ORDER — STERILE WATER FOR INJECTION IV SOLN
INTRAVENOUS | Status: AC
  Administered 2018-10-01 (×2): via INTRAVENOUS
  Filled 2018-10-01 (×2): qty 850

## 2018-10-01 MED ORDER — PHENOL 1.4 % MT LIQD
1.0000 | OROMUCOSAL | Status: DC | PRN
  Filled 2018-10-01: qty 177

## 2018-10-01 MED ORDER — PANTOPRAZOLE SODIUM 40 MG PO PACK
40.0000 mg | PACK | Freq: Every day | ORAL | Status: DC
  Filled 2018-10-01: qty 20

## 2018-10-01 MED ORDER — IPRATROPIUM-ALBUTEROL 0.5-2.5 (3) MG/3ML IN SOLN
3.0000 mL | Freq: Four times a day (QID) | RESPIRATORY_TRACT | Status: DC
  Administered 2018-10-01 – 2018-10-02 (×6): 3 mL via RESPIRATORY_TRACT
  Filled 2018-10-01 (×5): qty 3

## 2018-10-01 MED ORDER — CHARCOAL ACTIVATED PO LIQD
50.0000 g | Freq: Once | ORAL | Status: AC
  Administered 2018-10-01: 50 g via ORAL
  Filled 2018-10-01: qty 240

## 2018-10-01 MED ORDER — IPRATROPIUM-ALBUTEROL 0.5-2.5 (3) MG/3ML IN SOLN: 3.0000 mL | RESPIRATORY_TRACT | Status: DC | PRN

## 2018-10-01 MED ORDER — INFLUENZA VAC SPLIT QUAD 0.5 ML IM SUSY
0.5000 mL | PREFILLED_SYRINGE | INTRAMUSCULAR | Status: AC
  Administered 2018-10-02: 0.5 mL via INTRAMUSCULAR
  Filled 2018-10-01: qty 0.5

## 2018-10-01 NOTE — Progress Notes (Signed)
OGT initially noted to be at 40cm per ER Nurse however no KUB to confirm placement.  ABD xray obtained to check for placement once patient arrived to ICU.  Per initial xray, OGT does not appear to be in correct position.  OGT advanced from 40cm to 70cm and another abd xray obtained to confirm placement.

## 2018-10-01 NOTE — H&P (Signed)
[facility] [place] [facility]   PATIENT NAME: Maria Espinoza    MR#:  [id number]  DATE OF BIRTH:  1991/06/11  DATE OF ADMISSION:  09/30/2018  PRIMARY CARE PHYSICIAN: Patient, No Pcp Per   REQUESTING/REFERRING PHYSICIAN: Loleta Rose, MD  CHIEF COMPLAINT:   Chief Complaint  Patient presents with   Drug Overdose    HISTORY OF PRESENT ILLNESS:  Maria Espinoza  is a 28 y.o. female with a known history of morbid obesity, bipolar D/O, p/w suicide attempt via intentional Doxepin overdose, episode of aspiration w/ acute respiratory failure necessitating intubation. Pt is intubated at the time of my assessment; Hx/ROS unobtainable. Case d/w ED provider. Pt reportedly took "a bottle" of Doxepin (qty unknown) in an attempt to end her life. Endorsed depression but gave no specific reason for ingestion. Poison Control recommended 6hr observation. Pt was later noted to be obtunded/stuporous, and ultimately became unable to protect airway. She was reported to have had an episode of aspiration and subsequently became poorly-responsive, prompting intubation. (+) diffuse coarse rhonchi on lung exam. Admission labwork was impressively rather normal. QTc 472.  PAST MEDICAL HISTORY:  History reviewed. No pertinent past medical history.  PAST SURGICAL HISTORY:   Past Surgical History:  Procedure Laterality Date   TONSILLECTOMY      SOCIAL HISTORY:   Social History   Tobacco Use   Smoking status: Current Every Day Smoker    Packs/day: 0.50   Smokeless tobacco: Never Used  Substance Use Topics   Alcohol use: Yes    Comment: occasional    FAMILY HISTORY:   Family History  Problem Relation Age of Onset   Migraines Father    Heart disease Maternal Grandmother    Depression Maternal Grandmother    Hypertension Maternal Grandmother    Breast cancer Paternal Grandfather     DRUG ALLERGIES:   Allergies  Allergen Reactions   Latex Rash    REVIEW OF  SYSTEMS:   Review of Systems  Unable to perform ROS: Intubated  Psychiatric/Behavioral: Positive for depression and suicidal ideas.   MEDICATIONS AT HOME:   Prior to Admission medications   Medication Sig Start Date End Date Taking? Authorizing Provider  JENCYCLA 0.35 MG tablet Take 1 tablet by mouth daily. as directed 05/06/18   [provider]  levonorgestrel (MIRENA) 20 MCG/24HR IUD 1 each by Intrauterine route once. 08/17/18   [provider]      VITAL SIGNS:  Blood pressure 101/70, pulse 73, temperature (!) 97.4 F (36.3 C), temperature source Oral, resp. rate 18, height  (1.676 m), weight 133.4 kg, SpO2 100 %.  PHYSICAL EXAMINATION:  Physical Exam Constitutional:      General: She is sleeping.     Appearance: She is morbidly obese. She is not toxic-appearing or diaphoretic.     Interventions: She is sedated and intubated.  HENT:     Head: Atraumatic.  Eyes:     General: No scleral icterus.    Conjunctiva/sclera: Conjunctivae normal.  Neck:     Musculoskeletal: Neck supple.  Cardiovascular:     Rate and Rhythm: Normal rate and regular rhythm.  No extrasystoles are present.    Heart sounds: Normal heart sounds, S1 normal and S2 normal. No murmur. No friction rub. No gallop. No S3 or S4 sounds.   Pulmonary:     Effort: She is intubated.     Breath sounds: No stridor, decreased air movement or transmitted upper airway sounds. Examination of the  right-upper field reveals decreased breath sounds and rhonchi. Examination of the left-upper field reveals decreased breath sounds and rhonchi. Examination of the right-middle field reveals decreased breath sounds and rhonchi. Examination of the left-middle field reveals decreased breath sounds and rhonchi. Examination of the right-lower field reveals decreased breath sounds and rhonchi. Examination of the left-lower field reveals decreased breath sounds and rhonchi. Decreased breath sounds and rhonchi present. No  wheezing or rales.  Abdominal:     General: There is no distension.     Palpations: Abdomen is soft.     Tenderness: There is no abdominal tenderness. There is no guarding or rebound.  Musculoskeletal:        General: No swelling or tenderness.     Right lower leg: No edema.     Left lower leg: No edema.  Lymphadenopathy:     Cervical: No cervical adenopathy.  Skin:    General: Skin is warm and dry.     Findings: No erythema or rash.  Neurological:     Mental Status: She is unresponsive.     Comments: Intubated/sedated.  Psychiatric:        Attention and Perception: She is inattentive.        Speech: She is noncommunicative.     Comments: Intubated/sedated.    LABORATORY PANEL:   CBC Recent Labs  Lab 09/30/18 2218  WBC 7.3  HGB 13.2  HCT 40.5  PLT 336   ------------------------------------------------------------------------------------------------------------------  Chemistries  Recent Labs  Lab 09/30/18 2218  NA 137  K 3.5  CL 104  CO2 24  GLUCOSE 89  BUN 11  CREATININE 0.84  CALCIUM 9.0  MG 1.9  AST 19  ALT 13  ALKPHOS 80  BILITOT 0.6   ------------------------------------------------------------------------------------------------------------------  Cardiac Enzymes No results for input(s): TROPONINI in the last 168 hours. ------------------------------------------------------------------------------------------------------------------  RADIOLOGY:  Dg Chest 1 View  Result Date: 10/01/2018 CLINICAL DATA:  Endotracheal tube placement. EXAM: CHEST  1 VIEW COMPARISON:  None. FINDINGS: Endotracheal tube tip projects 7 mm above the carina. Cardiac silhouette is normal in size. No mediastinal or hilar masses. Lung volumes are low. There is prominence of the bronchovascular markings. No lung consolidation or convincing edema. No pleural effusion or pneumothorax. Skeletal structures are grossly intact. IMPRESSION: 1. Endotracheal tube tip projects 7 mm above  the carina. 2. No acute cardiopulmonary disease. Electronically Signed   By: Amie Portland M.D.   On: 10/01/2018 00:15   IMPRESSION AND PLAN:   A/P: 60F w/ PMHx morbid obesity, bipolar D/O, p/w suicide attempt via intentional Doxepin overdose, episode of aspiration w/ acute respiratory failure necessitating intubation. -Suicide attempt, intentional Doxepin overdose: Poison control recommended 6hr observation. Pt has since been intubated. QTc 472. Bicarb gtt. Cardiac monitoring, pulse ox. Psychiatry consult once appropriate. -Aspiration, acute respiratory failure, intubated: Coarse breath sounds (likely 2/2 aspiration). Probably chemical pneumonitis. Nebs, suctioning. Not ordered for ABx. -Hold home meds. -FEN/GI: NPO. -DVT PPx: Lovenox. -Code status: Full code. -Disposition: Admission, > 2 midnights.   All the records are reviewed and case discussed with ED provider. Management plans discussed with the patient, family and they are in agreement.  CODE STATUS: Full code.  TOTAL TIME TAKING CARE OF THIS PATIENT: 75 minutes.    Barbaraann Rondo M.D on 10/01/2018 at 4:14 AM  Between 7am to 6pm - Pager - [phone]  After 6pm go to [email or web address] - Social research officer, government  [facility] [place] [facility]  Office  [phone]  CC: Primary care physician; Patient, No Pcp  Per   Note: This dictation was prepared with Dragon dictation along with smaller phrase technology. Any transcriptional errors that result from this process are unintentional.

## 2018-10-01 NOTE — ED Notes (Signed)
Pt attempting to remove ETT. Propofol increased, awaiting RT for transport to CCU. Report to hilal, rn.

## 2018-10-01 NOTE — ED Notes (Addendum)
The following belongings previously removed from pt: white socks, tongue ring, and black t shirt placed in belonging bag and placed at head of strecher

## 2018-10-01 NOTE — Plan of Care (Signed)
Pt calm , cooperative. Sitter @ bedside @ all times. Family visited. Will continue to monitor closely.

## 2018-10-01 NOTE — Progress Notes (Signed)
Pharmacy Electrolyte Monitoring Consult:  Pharmacy consulted to assist in monitoring and replacing electrolytes in this 28 y.o. female admitted on 09/30/2018 with Drug Overdose   Labs:  Sodium (mmol/L)  Date Value  10/01/2018 139   Potassium (mmol/L)  Date Value  10/01/2018 3.8   Magnesium (mg/dL)  Date Value  26/71/2458 1.7   Phosphorus (mg/dL)  Date Value  09/98/3382 3.2   Calcium (mg/dL)  Date Value  50/53/9767 8.0 (L)   Albumin (g/dL)  Date Value  34/19/3790 3.1 (L)    Assessment/Plan: No further replacement warranted. Will obtain follow up electrolytes with am labs.    Marili Vader L 10/01/2018 5:07 PM

## 2018-10-01 NOTE — ED Notes (Signed)
Report from previous RN, no sitter at bedside currently. Pt lying in bed with personal clothing on and belongings in place. Pt currently not on cardiac monitor, placed on monitor by this RN. Charge notified of need for [employer].

## 2018-10-01 NOTE — ED Notes (Signed)
ED TO INPATIENT HANDOFF REPORT  ED Nurse Name and Phone #: Maria Espinoza [phone]   S Name/Age/Gender Maria Espinoza 28 y.o. female Room/Bed: ED24A/ED24A  Code Status   Code Status: Not on file  Home/SNF/Other Home   Is this baseline? pt is on ventilator  Triage Complete: Triage complete  Chief Complaint LOC  Triage Note Pt coems via EMS from home after taking about 30 doxepin 50mg  tablets around 7pm or 8pm. Pt BP was 125/74 with EMS, HR 60, and 100% on RA. PT is arousable with voice and answers questions slowly and with broken speech. Pt does endorse SI.    Allergies Allergies  Allergen Reactions  . Latex Rash    Level of Care/Admitting Diagnosis ED Disposition    ED Disposition Condition Comment   Admit  Hospital Area: [facility] [[id number]]  Level of Care: ICU [6]  Diagnosis: Intentional doxepin overdose, initial encounter [facility]) [[id number]]  Admitting Physician: Barbaraann Rondo [[id number]]  Attending Physician: Barbaraann Rondo [[id number]]  Estimated length of stay: past midnight tomorrow  Certification:: I certify this patient will need inpatient services for at least 2 midnights  PT Class (Do Not Modify): Inpatient [101]  PT Acc Code (Do Not Modify): Private [1]       B Medical/Surgery History History reviewed. No pertinent past medical history. Past Surgical History:  Procedure Laterality Date  . TONSILLECTOMY       A IV Location/Drains/Wounds Patient Lines/Drains/Airways Status   Active Line/Drains/Airways    Name:   Placement date:   Placement time:   Site:   Days:   Peripheral IV 09/30/18 Right Arm   09/30/18    2221    Arm   1   Peripheral IV 09/30/18 Left Hand   09/30/18    2356    Hand   1   NG/OG Tube Orogastric 18 Fr. Center mouth Aucultation Measured external length of tube 40 cm   10/01/18    0040    Center mouth   less than 1   Urethral Catheter Maria Rutan, rn Straight-tip 16 Fr.   10/01/18    0042    Straight-tip   less than 1    Airway 7.5 mm   09/30/18    2358     1          Intake/Output Last 24 hours No intake or output data in the 24 hours ending 10/01/18 0056  Labs/Imaging Results for orders placed or performed during the hospital encounter of 09/30/18 (from the past 48 hour(s))  Comprehensive metabolic panel     Status: None   Collection Time: 09/30/18 10:18 PM  Result Value Ref Range   Sodium 137 135 - 145 mmol/L   Potassium 3.5 3.5 - 5.1 mmol/L   Chloride 104 98 - 111 mmol/L   CO2 24 22 - 32 mmol/L   Glucose, Bld 89 70 - 99 mg/dL   BUN 11 6 - 20 mg/dL   Creatinine, Ser 4.19 0.44 - 1.00 mg/dL   Calcium 9.0 8.9 - 37.9 mg/dL   Total Protein 8.0 6.5 - 8.1 g/dL   Albumin 3.8 3.5 - 5.0 g/dL   AST 19 15 - 41 U/L   ALT 13 0 - 44 U/L   Alkaline Phosphatase 80 38 - 126 U/L   Total Bilirubin 0.6 0.3 - 1.2 mg/dL   GFR calc non Af Amer >60 >60 mL/min   GFR calc Af Amer >60 >60 mL/min   Anion gap 9 5 - 15  Comment: Performed at [facility], [address] [place], [place] [address]  Ethanol     Status: None   Collection Time: 09/30/18 10:18 PM  Result Value Ref Range   Alcohol, Ethyl (B) <10 <10 mg/dL    Comment: (NOTE) Lowest detectable limit for serum alcohol is 10 mg/dL. For medical purposes only. Performed at [facility], [address] [place], [place] [address]   Salicylate level     Status: None   Collection Time: 09/30/18 10:18 PM  Result Value Ref Range   Salicylate Lvl <7.0 2.8 - 30.0 mg/dL    Comment: Performed at [facility], [address] [place], [place] [address]  Acetaminophen level     Status: Abnormal   Collection Time: 09/30/18 10:18 PM  Result Value Ref Range   Acetaminophen (Tylenol), Serum <10 (L) 10 - 30 ug/mL    Comment: (NOTE) Therapeutic concentrations vary significantly. A range of 10-30 ug/mL  may be an effective concentration for many patients. However, some  are best treated at concentrations outside of this  range. Acetaminophen concentrations >150 ug/mL at 4 hours after ingestion  and >50 ug/mL at 12 hours after ingestion are often associated with  toxic reactions. Performed at [facility], [address] [place], [place] [address]   cbc     Status: None   Collection Time: 09/30/18 10:18 PM  Result Value Ref Range   WBC 7.3 4.0 - 10.5 K/uL   RBC 4.54 3.87 - 5.11 MIL/uL   Hemoglobin 13.2 12.0 - 15.0 g/dL   HCT 12.7 51.7 - 00.1 %   MCV 89.2 80.0 - 100.0 fL   MCH 29.1 26.0 - 34.0 pg   MCHC 32.6 30.0 - 36.0 g/dL   RDW 74.9 44.9 - 67.5 %   Platelets 336 150 - 400 K/uL   nRBC 0.0 0.0 - 0.2 %    Comment: Performed at [facility], [address] [place], [place] [address]  Magnesium     Status: None   Collection Time: 09/30/18 10:18 PM  Result Value Ref Range   Magnesium 1.9 1.7 - 2.4 mg/dL    Comment: Performed at [facility], [address] [place], [place] [address]  Phosphorus     Status: None   Collection Time: 09/30/18 10:18 PM  Result Value Ref Range   Phosphorus 2.9 2.5 - 4.6 mg/dL    Comment: Performed at [facility], [address] [place], [place] [address]  Urine Drug Screen, Qualitative     Status: Abnormal   Collection Time: 09/30/18 11:26 PM  Result Value Ref Range   Tricyclic, Ur Screen POSITIVE (A) NONE DETECTED   Amphetamines, Ur Screen NONE DETECTED NONE DETECTED   MDMA (Ecstasy)Ur Screen NONE DETECTED NONE DETECTED   Cocaine Metabolite,Ur Sherrill NONE DETECTED NONE DETECTED   Opiate, Ur Screen NONE DETECTED NONE DETECTED   Phencyclidine (PCP) Ur S NONE DETECTED NONE DETECTED   Cannabinoid 50 Ng, Ur Dora POSITIVE (A) NONE DETECTED   Barbiturates, Ur Screen NONE DETECTED NONE DETECTED   Benzodiazepine, Ur Scrn NONE DETECTED NONE DETECTED   Methadone Scn, Ur NONE DETECTED NONE DETECTED    Comment: (NOTE) Tricyclics + metabolites, urine    Cutoff 1000 ng/mL Amphetamines + metabolites, urine  Cutoff 1000 ng/mL MDMA (Ecstasy),  urine              Cutoff 500 ng/mL Cocaine Metabolite, urine          Cutoff  300 ng/mL Opiate + metabolites, urine        Cutoff 300 ng/mL Phencyclidine (PCP), urine         Cutoff 25 ng/mL Cannabinoid, urine                 Cutoff 50 ng/mL Barbiturates + metabolites, urine  Cutoff 200 ng/mL Benzodiazepine, urine              Cutoff 200 ng/mL Methadone, urine                   Cutoff 300 ng/mL The urine drug screen provides only a preliminary, unconfirmed analytical test result and should not be used for non-medical purposes. Clinical consideration and professional judgment should be applied to any positive drug screen result due to possible interfering substances. A more specific alternate chemical method must be used in order to obtain a confirmed analytical result. Gas chromatography / mass spectrometry (GC/MS) is the preferred confirmat ory method. Performed at [facility], [address] [place], [place] [address]   Acetaminophen level     Status: Abnormal   Collection Time: 09/30/18 11:26 PM  Result Value Ref Range   Acetaminophen (Tylenol), Serum <10 (L) 10 - 30 ug/mL    Comment: (NOTE) Therapeutic concentrations vary significantly. A range of 10-30 ug/mL  may be an effective concentration for many patients. However, some  are best treated at concentrations outside of this range. Acetaminophen concentrations >150 ug/mL at 4 hours after ingestion  and >50 ug/mL at 12 hours after ingestion are often associated with  toxic reactions. Performed at [facility], [address] [place], [place] [address]   Glucose, capillary     Status: Abnormal   Collection Time: 09/30/18 11:51 PM  Result Value Ref Range   Glucose-Capillary 107 (H) 70 - 99 mg/dL  Blood gas, arterial     Status: Abnormal   Collection Time: 10/01/18 12:27 AM  Result Value Ref Range   FIO2 0.50    Delivery systems VENTILATOR    Mode ASSIST CONTROL    VT 550 mL   LHR 18 resp/min    Peep/cpap 5.0 cm H20   pH, Arterial 7.39 7.350 - 7.450   pCO2 arterial 37 32.0 - 48.0 mmHg   pO2, Arterial 147 (H) 83.0 - 108.0 mmHg   Bicarbonate 22.4 20.0 - 28.0 mmol/L   Acid-base deficit 2.2 (H) 0.0 - 2.0 mmol/L   O2 Saturation 99.3 %   Patient temperature 37.0    Collection site RIGHT RADIAL    Sample type ARTERIAL DRAW    Allens test (pass/fail) PASS PASS    Comment: Performed at [facility], [address] [place], [place] [address]  Pregnancy, urine POC     Status: None   Collection Time: 10/01/18 12:50 AM  Result Value Ref Range   Preg Test, Ur NEGATIVE NEGATIVE    Comment:        THE SENSITIVITY OF THIS METHODOLOGY IS >24 mIU/mL    Dg Chest 1 View  Result Date: 10/01/2018 CLINICAL DATA:  Endotracheal tube placement. EXAM: CHEST  1 VIEW COMPARISON:  None. FINDINGS: Endotracheal tube tip projects 7 mm above the carina. Cardiac silhouette is normal in size. No mediastinal or hilar masses. Lung volumes are low. There is prominence of the bronchovascular markings. No lung consolidation or convincing edema. No pleural effusion or pneumothorax. Skeletal structures are grossly intact. IMPRESSION: 1. Endotracheal tube tip projects 7 mm above the carina. 2. No  acute cardiopulmonary disease. Electronically Signed   By: Amie Portland M.D.   On: 10/01/2018 00:15    Pending Labs Unresulted Labs (From admission, onward)    Start     Ordered   Signed and Held  HIV antibody (Routine Testing)  Once,   R     Signed and Held          Vitals/Pain Today's Vitals   10/01/18 0030 10/01/18 0045 10/01/18 0050 10/01/18 0050  BP:      Pulse: (!) 114 (!) 101 95   Resp: (!) Temp:      TempSrc:      SpO2: 100% 100% 100%   Weight:      Height:      PainSc:        Isolation Precautions No active isolations  Medications Medications  propofol (DIPRIVAN) 1000 MG/100ML infusion (25 mcg/kg/min  127 kg Intravenous Rate/Dose Change 10/01/18 0024)  fentaNYL in  NS (80mcg/ml) infusion-PREMIX (100 mcg/hr Intravenous New Bag/Given 10/01/18 0006)  sodium bicarbonate 150 mEq in sterile water 1,000 mL infusion (has no administration in time range)    Followed by  sodium bicarbonate 150 mEq in sterile water 1,000 mL infusion (has no administration in time range)  etomidate (AMIDATE) injection 20 mg (20 mg Intravenous Given by Other 09/30/18 2356)  succinylcholine (ANECTINE) injection 190 mg (190 mg Intravenous Given by Other 09/30/18 2356)    Mobility  Moderate fall risk   Focused Assessments Cardiac Assessment Handoff:  Cardiac Rhythm: Sinus tachycardia No results found for: CKTOTAL, CKMB, CKMBINDEX, TROPONINI No results found for: DDIMER Does the Patient currently have chest pain? Yes   , Pulmonary Assessment Handoff:  Lung sounds: L Breath Sounds: Clear R Breath Sounds: Clear O2 Device: Ventilator        R Recommendations: See Admitting Provider Note  Report given to:   Additional Notes: pt is under IVC for SI

## 2018-10-01 NOTE — Progress Notes (Signed)
[facility] - [place] [facility]   PATIENT NAME: Maria Espinoza    MR#:  [id number]  DATE OF BIRTH:  Apr 03, 1991  SUBJECTIVE:   Patient came in with overdose with Doxepin. Intubated and on the ventilator for every protection. Currently on pressure support. REVIEW OF SYSTEMS:   Review of Systems  Unable to perform ROS: Intubated   Tolerating Diet: Tolerating PT:   DRUG ALLERGIES:   Allergies  Allergen Reactions  . Latex Rash    VITALS:  Blood pressure 108/68, pulse 75, temperature (!) 97.4 F (36.3 C), temperature source Oral, resp. rate 16, height 5\' 6"  (1.676 m), weight 133.4 kg, SpO2 98 %.  PHYSICAL EXAMINATION:   Physical Exam  GENERAL:  28 y.o.-year-old patient lying in the bed with no acute distress. obese EYES: Pupils equal, round, reactive to light and accommodation. No scleral icterus. Extraocular muscles intact.  HEENT: Head atraumatic, normocephalic. Oropharynx and nasopharynx clear.  NECK:  Supple, no jugular venous distention. No thyroid enlargement, no tenderness.  LUNGS: Normal breath sounds bilaterally, no wheezing, rales, rhonchi. No use of accessory muscles of respiration.  CARDIOVASCULAR: S1, S2 normal. No murmurs, rubs, or gallops.  ABDOMEN: Soft, nontender, nondistended. Bowel sounds present. No organomegaly or mass.  EXTREMITIES: No cyanosis, clubbing or edema b/l.    NEUROLOGIC:  Intubated  PSYCHIATRIC:  patient is intubated  SKIN: No obvious rash, lesion, or ulcer.   LABORATORY PANEL:  CBC Recent Labs  Lab 09/30/18 2218  WBC 7.3  HGB 13.2  HCT 40.5  PLT 336    Chemistries  Recent Labs  Lab 10/01/18 0440  NA 139  K 3.0*  CL 107  CO2 25  GLUCOSE 111*  BUN 9  CREATININE 0.68  CALCIUM 8.0*  MG 1.7  AST 18  ALT 12  ALKPHOS 61  BILITOT 0.7   Cardiac Enzymes No results for input(s): TROPONINI in the last 168 hours. RADIOLOGY:  Dg Chest 1 View  Result Date: 10/01/2018 CLINICAL DATA:  Endotracheal  tube placement. EXAM: CHEST  1 VIEW COMPARISON:  None. FINDINGS: Endotracheal tube tip projects 7 mm above the carina. Cardiac silhouette is normal in size. No mediastinal or hilar masses. Lung volumes are low. There is prominence of the bronchovascular markings. No lung consolidation or convincing edema. No pleural effusion or pneumothorax. Skeletal structures are grossly intact. IMPRESSION: 1. Endotracheal tube tip projects 7 mm above the carina. 2. No acute cardiopulmonary disease. Electronically Signed   By: Amie Portland M.D.   On: 10/01/2018 00:15   Dg Abd 1 View  Result Date: 10/01/2018 CLINICAL DATA:  Orogastric tube placement EXAM: ABDOMEN - 1 VIEW ABDOMEN - 1 VIEW COMPARISON:  None. FINDINGS: Two abdominal radiographs are provided. The first was obtained at 3:31 a.m. and the second at 3:39 a.m. On the first radiograph, the orogastric tube tip and side port project over the thoracic esophagus. On the second radiograph, the tube has been advanced and the side port projects over the stomach with the tip near the gastroduodenal junction. Lungs are clear. No dilated small bowel is visible. IMPRESSION: Second radiograph shows the orogastric tube projecting over the stomach with tip near the gastroduodenal junction. Electronically Signed   By: Deatra Robinson M.D.   On: 10/01/2018 04:21   Dg Abd 1 View  Result Date: 10/01/2018 CLINICAL DATA:  Orogastric tube placement EXAM: ABDOMEN - 1 VIEW ABDOMEN - 1 VIEW COMPARISON:  None. FINDINGS: Two abdominal radiographs are provided. The first was obtained at  3:31 a.m. and the second at 3:39 a.m. On the first radiograph, the orogastric tube tip and side port project over the thoracic esophagus. On the second radiograph, the tube has been advanced and the side port projects over the stomach with the tip near the gastroduodenal junction. Lungs are clear. No dilated small bowel is visible. IMPRESSION: Second radiograph shows the orogastric tube projecting over the  stomach with tip near the gastroduodenal junction. Electronically Signed   By: Deatra Robinson M.D.   On: 10/01/2018 04:21   ASSESSMENT AND PLAN:   Maria Espinoza  is a 28 y.o. female with a known history of morbid obesity, bipolar D/O, p/w suicide attempt via intentional Doxepin overdose, episode of aspiration w/ acute respiratory failure necessitating intubation  1. Intentional overdose with Doxepin -patient has history of bipolar disorder -intubated and on the ventilator for airway protection -continue symptomatic care -psychiatry consultation with Dr. Bufford Buttner--- once patient extubated  2. Hypokalemia -IV fluids and replace potassium  3. History of bipolar disorder per psych  4. DVT prophylaxis subcu Lovenox   Case discussed with Care Management/Social Worker.  CODE STATUS: full  DVT Prophylaxis: lovenox  TOTAL TIME TAKING CARE OF THIS PATIENT: *30* minutes.  >50% time spent on counselling and coordination of care  POSSIBLE D/C IN *?* DAYS, DEPENDING ON CLINICAL CONDITION.  Note: This dictation was prepared with Dragon dictation along with smaller phrase technology. Any transcriptional errors that result from this process are unintentional.  Enedina Finner M.D on 10/01/2018 at 2:40 PM  Between 7am to 6pm - Pager - [phone]  After 6pm go to [email or web address] - password [email or web address]  [facility]  Office  [phone]  CC: Primary care physician; Patient, No Pcp PerPatient ID: Maria Espinoza, female   DOB: 03-24-1991, 28 y.o.   MRN: [id number]

## 2018-10-01 NOTE — ED Notes (Signed)
Per charge RN in CCU can not transport pt to unit at this time.

## 2018-10-01 NOTE — Consult Note (Addendum)
PULMONARY / CRITICAL CARE MEDICINE  Name: Maria Espinoza MRN: [id number] DOB: 1991-04-26    LOS: 0  Referring Provider: Dr. Marjie Skiff Reason for Referral: Acute hypoxic respiratory failure due to overdose  HPI: This is a 28 year old female with obesity and bipolar depression who presented to the ED after overdosing on doxepin.  History is obtained from ED records as patient is currently intubated and sedated.  Per ED records, patient took 30 doxepin 50 mg tablets around 7 PM last night.  When EMS arrived, patient was arousable with voice and answer questions slowly.  At the ED, she quickly decompensated requiring intubation.  She is been transferred to the ICU for further management.   Poison Control Center recommends tylenol level again at 11pm, and monitor for seizures, hypertension, EKG changes and pulmonary edema.  Initial EKG showed a QTC of 472.  Past Medical History:  Diagnosis Date  . Bipolar depression (HCC)    Past Surgical History:  Procedure Laterality Date  . TONSILLECTOMY     Prior to Admission medications   Medication Sig Start Date End Date Taking? Authorizing Provider  amLODipine (NORVASC) 5 MG tablet Take 5 mg by mouth daily.   Yes [provider]  clopidogrel (PLAVIX) 75 MG tablet Take 75 mg by mouth daily.   Yes [provider]  donepezil (ARICEPT) 5 MG tablet Take 1 tablet (5 mg total) by mouth at bedtime. 03/12/18 04/21/18 Yes Sowles, Danna Hefty, MD  empagliflozin (JARDIANCE) 25 MG TABS tablet Take 25 mg by mouth daily.   Yes [provider]  glycopyrrolate (ROBINUL) 1 MG tablet Take 1 mg by mouth 2 (two) times daily.   Yes [provider]  insulin aspart (NOVOLOG FLEXPEN) 100 UNIT/ML FlexPen Inject 12 Units into the skin 2 (two) times daily.   Yes [provider]  insulin aspart (NOVOLOG) 100 UNIT/ML FlexPen Inject 18 Units into the skin daily. At 1700   Yes [provider]  Insulin Degludec-Liraglutide (XULTOPHY)  100-3.6 UNIT-MG/ML SOPN Inject 50 Units into the skin daily.   Yes [provider]  levETIRAcetam (KEPPRA) 500 MG tablet Take 500 mg by mouth 2 (two) times daily.   Yes [provider]  lipase/protease/amylase (CREON) 12000 units CPEP capsule Take 6,000 Units by mouth 3 (three) times daily before meals.   Yes [provider]  lipase/protease/amylase (CREON) 12000 units CPEP capsule Take 3,000 Units by mouth at bedtime. With snack   Yes [provider]  lisinopril (PRINIVIL,ZESTRIL) 5 MG tablet Take 5 mg by mouth daily.   Yes [provider]  metoprolol succinate (TOPROL-XL) 25 MG 24 hr tablet Take 1 tablet (25 mg total) by mouth daily. 03/12/18  Yes Sowles, Danna Hefty, MD  rosuvastatin (CRESTOR) 40 MG tablet Take 1 tablet (40 mg total) by mouth daily. 03/12/18 04/21/18 Yes Alba Cory, MD  aspirin EC 81 MG tablet Take 81 mg by mouth daily.    [provider]  famotidine (PEPCID) 20 MG tablet Take 1 tablet (20 mg total) by mouth 2 (two) times daily. 03/12/18 04/11/18  Alba Cory, MD  gabapentin (NEURONTIN) 300 MG capsule Take 1 capsule (300 mg total) by mouth 2 (two) times daily. 03/12/18 04/11/18  Alba Cory, MD  insulin glargine (LANTUS) 100 UNIT/ML injection Inject 0.1 mLs (10 Units total) into the skin daily. 03/12/18 04/11/18  Alba Cory, MD  lacosamide 100 MG TABS Take 1 tablet (100 mg total) by mouth 2 (two) times daily. Patient not taking: Reported on 04/21/2018 07/28/17  Enedina Finner, MD  promethazine (PHENERGAN) 12.5 MG tablet Take 1 tablet (12.5 mg total) by mouth every 6 (six) hours as needed for nausea or vomiting. Patient not taking: Reported on 04/21/2018 05/16/17   Almond Lint, MD  sertraline (ZOLOFT) 25 MG tablet Take 1 tablet (25 mg total) by mouth daily. Patient not taking: Reported on 04/21/2018 03/12/18   Alba Cory, MD   Allergies Allergies  Allergen Reactions  . Latex Rash    Family History Family History   Problem Relation Age of Onset  . Migraines Father   . Heart disease Maternal Grandmother   . Depression Maternal Grandmother   . Hypertension Maternal Grandmother   . Breast cancer Paternal Grandfather    Social History  reports that she has been smoking. She has been smoking about 0.50 packs per day. She has never used smokeless tobacco. She reports current alcohol use. She reports that she does not use drugs.  Review Of Systems: Unable to obtain as patient is intubated and sedated  VITAL SIGNS: BP 108/68   Pulse 71   Temp (!) 97.4 F (36.3 C) (Oral)   Resp 18   Ht  (1.676 m)   Wt 133.4 kg   SpO2 100%   BMI 47.47 kg/m   HEMODYNAMICS:    VENTILATOR SETTINGS: Vent Mode: PRVC FiO2 (%):  [21 %-100 %] 21 % Set Rate:  [18 bmp] 18 bmp Vt Set:  [500 mL-550 mL] 500 mL PEEP:  [5 cmH20] 5 cmH20 Plateau Pressure:  [18 cmH20] 18 cmH20  INTAKE / OUTPUT: I/O last 3 completed shifts: In: 1266.9 [I.V.:1266.9] Out: 525 [Urine:525]  PHYSICAL EXAMINATION: General: No acute distress, on the vent HEENT: PERRLA, trachea midline, no JVD Neuro: Withdraws to noxious stimulus, moves all extremities Cardiovascular: Apical pulse regular, S1-S2, no murmur regurg or gallop, +2 pulses bilaterally, no edema Lungs: Clear to auscultation bilaterally Abdomen: Non-distended, normal bowel sounds in all 4 quadrants, palpation reveals no organomegaly Musculoskeletal: No joint deformities, positive range of motion Skin: Warm and dry  LABS:  BMET Recent Labs  Lab 09/30/18 2218 10/01/18 0440  NA 137 139  K 3.5 3.0*  CL 104 107  CO2 24 25  BUN 11 9  CREATININE 0.84 0.68  GLUCOSE 89 111*    Electrolytes Recent Labs  Lab 09/30/18 2218 10/01/18 0440  CALCIUM 9.0 8.0*  MG 1.9 1.7  PHOS 2.9 3.2    CBC Recent Labs  Lab 09/30/18 2218  WBC 7.3  HGB 13.2  HCT 40.5  PLT 336    Coag's No results for input(s): APTT, INR in the last 168 hours.  Sepsis Markers No results for  input(s): LATICACIDVEN, PROCALCITON, O2SATVEN in the last 168 hours.  ABG Recent Labs  Lab 10/01/18 0027 10/01/18 0500  PHART 7.39 7.49*  PCO2ART 37 34  PO2ART 147* 141*    Liver Enzymes Recent Labs  Lab 09/30/18 2218 10/01/18 0440  AST 19 18  ALT 13 12  ALKPHOS 80 61  BILITOT 0.6 0.7  ALBUMIN 3.8 3.1*    Cardiac Enzymes No results for input(s): TROPONINI, PROBNP in the last 168 hours.  Glucose Recent Labs  Lab 09/30/18 2351 10/01/18 0248  GLUCAP 107* 108*    Imaging Dg Chest 1 View  Result Date: 10/01/2018 CLINICAL DATA:  Endotracheal tube placement. EXAM: CHEST  1 VIEW COMPARISON:  None. FINDINGS: Endotracheal tube tip projects 7 mm above the carina. Cardiac silhouette is normal in size. No mediastinal or hilar masses. Lung volumes are  low. There is prominence of the bronchovascular markings. No lung consolidation or convincing edema. No pleural effusion or pneumothorax. Skeletal structures are grossly intact. IMPRESSION: 1. Endotracheal tube tip projects 7 mm above the carina. 2. No acute cardiopulmonary disease. Electronically Signed   By: Amie Portland M.D.   On: 10/01/2018 00:15   Dg Abd 1 View  Result Date: 10/01/2018 CLINICAL DATA:  Orogastric tube placement EXAM: ABDOMEN - 1 VIEW ABDOMEN - 1 VIEW COMPARISON:  None. FINDINGS: Two abdominal radiographs are provided. The first was obtained at 3:31 a.m. and the second at 3:39 a.m. On the first radiograph, the orogastric tube tip and side port project over the thoracic esophagus. On the second radiograph, the tube has been advanced and the side port projects over the stomach with the tip near the gastroduodenal junction. Lungs are clear. No dilated small bowel is visible. IMPRESSION: Second radiograph shows the orogastric tube projecting over the stomach with tip near the gastroduodenal junction. Electronically Signed   By: Deatra Robinson M.D.   On: 10/01/2018 04:21   Dg Abd 1 View  Result Date: 10/01/2018 CLINICAL  DATA:  Orogastric tube placement EXAM: ABDOMEN - 1 VIEW ABDOMEN - 1 VIEW COMPARISON:  None. FINDINGS: Two abdominal radiographs are provided. The first was obtained at 3:31 a.m. and the second at 3:39 a.m. On the first radiograph, the orogastric tube tip and side port project over the thoracic esophagus. On the second radiograph, the tube has been advanced and the side port projects over the stomach with the tip near the gastroduodenal junction. Lungs are clear. No dilated small bowel is visible. IMPRESSION: Second radiograph shows the orogastric tube projecting over the stomach with tip near the gastroduodenal junction. Electronically Signed   By: Deatra Robinson M.D.   On: 10/01/2018 04:21   SIGNIFICANT EVENTS: 09/30/2018: Admitted  LINES/TUBES: Peripheral IVs Foley catheter  ASSESSMENT Vent dependent respiratory failure due to overdose Suicide attempt by intentional overdose on doxepin  Bipolar depression Hypokalemia  PLAN Hemodynamic monitoring per ICU protocol Full vent support with current settings Chest x-ray and ABG PRN Serial EKGs/QTc monitoring Follow-up with Poison Control Center regarding further recommendations if any changes in EKG Monitor and correct electrolytes Bicarb infusion Psych following; patient is currently under IVC  Best Practice: Code Status: Full code Diet: N.p.o. GI prophylaxis: Protonix VTE prophylaxis: SCDs and Lovenox  FAMILY  - Updates: No family at bedside.  Will update when able  Magdalene S. Tukov-Yual ANP-BC Pulmonary and Critical Care Medicine [facility] Pager [phone] or [phone]  NB: This document was prepared using Dragon voice recognition software and may include unintentional dictation errors.    10/01/2018, 10:14 AM

## 2018-10-01 NOTE — ED Notes (Signed)
TTS consult has been placed, it is of note that the pt is not medically clear. She will be medically monitored by poison control of 6 hours. Pt will be assess when medically cleared.

## 2018-10-01 NOTE — Progress Notes (Signed)
Pt extubated per MD order, pt tol well and placed on 2L. Will continue to monitor

## 2018-10-01 NOTE — Progress Notes (Signed)
Pharmacy lovenox dose adjustment Patient admitted for drug overdose s/p intubation after patient cannot protect airway. Patient's BMI > 40 w/ CrCl > 30 ml/min.  Patient ordered lovenox 40 mg subq daily for VTE prophylaxis. Will readjust to lovenox 40 mg bid per patient's criteria.  Thomasene Ripple, PharmD, BCPS Clinical Pharmacist 10/01/2018

## 2018-10-01 NOTE — Progress Notes (Signed)
eLink Physician-Brief Progress Note Patient Name: Maria Espinoza DOB: 08-11-1990 MRN: [id number]   Date of Service  10/01/2018  HPI/Events of Note  28 y/o F history of depression presented after intentional overdose of doxepin.  She initially was place under observation however she clinically deteriorated and possibly aspirated and hence was intubated.  Workup for co-ingestions negative.  eICU Interventions  Maintain mech vent until fully awake Monitor Qt Monitor for signs of fever/pneumonia, currently off antibiotics     Intervention Category Major Interventions: Respiratory failure - evaluation and management Intermediate Interventions: Change in mental status - evaluation and management Evaluation Type: New Patient Evaluation  Rosalie Gums Orrie Lascano 10/01/2018, 3:12 AM

## 2018-10-01 NOTE — Progress Notes (Signed)
CRITICAL CARE NOTE      SUBJECTIVE FINDINGS & SIGNIFICANT EVENTS   Patient remains critically ill Resting in bed comfortably. Overnight events noted.  SBT today, psych today  PAST MEDICAL HISTORY   Past Medical History:  Diagnosis Date  . Bipolar depression ([place])      SURGICAL HISTORY   Past Surgical History:  Procedure Laterality Date  . TONSILLECTOMY       FAMILY HISTORY   Family History  Problem Relation Age of Onset  . Migraines Father   . Heart disease Maternal Grandmother   . Depression Maternal Grandmother   . Hypertension Maternal Grandmother   . Breast cancer Paternal Grandfather      SOCIAL HISTORY   Social History   Tobacco Use  . Smoking status: Current Every Day Smoker    Packs/day: 0.50  . Smokeless tobacco: Never Used  Substance Use Topics  . Alcohol use: Yes    Comment: occasional  . Drug use: Never     MEDICATIONS   Current Medication:  Current Facility-Administered Medications:  .  bisacodyl (DULCOLAX) EC tablet 5 mg, 5 mg, Oral, Daily PRN, Jodell Cipro, Prasanna, MD .  chlorhexidine gluconate (MEDLINE KIT) (PERIDEX) 0.12 % solution 15 mL, 15 mL, Mouth Rinse, BID, Jodell Cipro, Prasanna, MD, 15 mL at 10/01/18 0824 .  enoxaparin (LOVENOX) injection 40 mg, 40 mg, Subcutaneous, BID, Jodell Cipro, Prasanna, MD, 40 mg at 10/01/18 0316 .  fentaNYL 2538mg in NS 255m(1047mml) infusion-PREMIX, 100 mcg/hr, Intravenous, Continuous, Sridharan, Prasanna, MD, Last Rate: 5 mL/hr at 10/01/18 0600, 50 mcg/hr at 10/01/18 0600 .  ipratropium-albuterol (DUONEB) 0.5-2.5 (3) MG/3ML nebulizer solution 3 mL, 3 mL, Nebulization, Q6H, Tukov-Yual, Magdalene S, NP, 3 mL at 10/01/18 0812 .  ipratropium-albuterol (DUONEB) 0.5-2.5 (3) MG/3ML nebulizer solution, , , ,  .  MEDLINE mouth rinse, 15  mL, Mouth Rinse, 10 times per day, SriArta SilenceD, 15 mL at 10/01/18 0600 .  pantoprazole (PROTONIX) injection 40 mg, 40 mg, Intravenous, Q24H, SimCharlett NosePH .  propofol (DIPRIVAN) 1000 MG/100ML infusion, 5-80 mcg/kg/min, Intravenous, Continuous, Sridharan, Prasanna, MD, Last Rate: 22.9 mL/hr at 10/01/18 0711, 30 mcg/kg/min at 10/01/18 0711 .  senna-docusate (Senokot-S) tablet 1 tablet, 1 tablet, Oral, QHS PRN, SriJodell Ciprorasanna, MD .  [EXPIRED] sodium bicarbonate 150 mEq in sterile water 1,000 mL infusion, , Intravenous, Continuous, Stopped at 10/01/18 0551 **FOLLOWED BY** sodium bicarbonate 150 mEq in sterile water 1,000 mL infusion, , Intravenous, Continuous, Sridharan, Prasanna, MD, Last Rate: 125 mL/hr at 10/01/18 0833    ALLERGIES   Latex    REVIEW OF SYSTEMS    Vitals:   10/01/18 0813 10/01/18 0822  BP:    Pulse: 75 71  Resp: 18 18  Temp:    SpO2: 99% 100%    PATIENT IS UNABLE TO PROVIDE COMPLETE REVIEW OF SYSTEMS DUE TO ACUTE CRITICAL ILLNESS    PHYSICAL EXAMINATION   GENERAL:critically ill appearing, +resp distress HEAD: Normocephalic, atraumatic.  EYES: Pupils equal, round, reactive to light.  No scleral icterus.  MOUTH: Moist mucosal membrane. NECK: Supple. No thyromegaly. No nodules. No JVD.  PULMONARY: Clear to auscultation bilaterally CARDIOVASCULAR: S1 and S2. Regular rate and rhythm. No murmurs, rubs, or gallops.  GASTROINTESTINAL: Soft, nontender, non-distended. No masses. Positive bowel sounds. No hepatosplenomegaly.  MUSCULOSKELETAL: No swelling, clubbing, or edema.  NEUROLOGIC: Mild distress due to acute illness SKIN:intact,warm,dry   LABS AND IMAGING     -I personally reviewed most recent blood work, imaging and microbiology - significant findings  today are hypokalemia.  LAB RESULTS: Recent Labs  Lab 09/30/18 2218 10/01/18 0440  NA 137 139  K 3.5 3.0*  CL 104 107  CO2 24 25  BUN 11 9  CREATININE 0.84 0.68  GLUCOSE 89  111*   Recent Labs  Lab 09/30/18 2218  HGB 13.2  HCT 40.5  WBC 7.3  PLT 336     IMAGING RESULTS: Dg Chest 1 View  Result Date: 10/01/2018 CLINICAL DATA:  Endotracheal tube placement. EXAM: CHEST  1 VIEW COMPARISON:  None. FINDINGS: Endotracheal tube tip projects 7 mm above the carina. Cardiac silhouette is normal in size. No mediastinal or hilar masses. Lung volumes are low. There is prominence of the bronchovascular markings. No lung consolidation or convincing edema. No pleural effusion or pneumothorax. Skeletal structures are grossly intact. IMPRESSION: 1. Endotracheal tube tip projects 7 mm above the carina. 2. No acute cardiopulmonary disease. Electronically Signed   By: Lajean Manes M.D.   On: 10/01/2018 00:15   Dg Abd 1 View  Result Date: 10/01/2018 CLINICAL DATA:  Orogastric tube placement EXAM: ABDOMEN - 1 VIEW ABDOMEN - 1 VIEW COMPARISON:  None. FINDINGS: Two abdominal radiographs are provided. The first was obtained at 3:31 a.m. and the second at 3:39 a.m. On the first radiograph, the orogastric tube tip and side port project over the thoracic esophagus. On the second radiograph, the tube has been advanced and the side port projects over the stomach with the tip near the gastroduodenal junction. Lungs are clear. No dilated small bowel is visible. IMPRESSION: Second radiograph shows the orogastric tube projecting over the stomach with tip near the gastroduodenal junction. Electronically Signed   By: Ulyses Jarred M.D.   On: 10/01/2018 04:21   Dg Abd 1 View  Result Date: 10/01/2018 CLINICAL DATA:  Orogastric tube placement EXAM: ABDOMEN - 1 VIEW ABDOMEN - 1 VIEW COMPARISON:  None. FINDINGS: Two abdominal radiographs are provided. The first was obtained at 3:31 a.m. and the second at 3:39 a.m. On the first radiograph, the orogastric tube tip and side port project over the thoracic esophagus. On the second radiograph, the tube has been advanced and the side port projects over the  stomach with the tip near the gastroduodenal junction. Lungs are clear. No dilated small bowel is visible. IMPRESSION: Second radiograph shows the orogastric tube projecting over the stomach with tip near the gastroduodenal junction. Electronically Signed   By: Ulyses Jarred M.D.   On: 10/01/2018 04:21      ASSESSMENT AND PLAN    -Multidisciplinary rounds held today  Acute Hypoxic  Respiratory Failure -Due to drug overdose-plan for spontaneous breathing trial in liberation from mechanical ventilation today -Discussed with poison control-recommend activated charcoal via NG tube -continue Full MV support -continue Bronchodilator Therapy -Wean Fio2 and PEEP as tolerated -will perform SAT/SBT when respiratory parameters are met  NEUROLOGY - intubated and sedated - minimal sedation to achieve a RASS goal: -1 Wake up assessment pending   GI/Nutrition GI PROPHYLAXIS as indicated DIET-->TF's as tolerated Constipation protocol as indicated  ENDO - ICU hypoglycemic\Hyperglycemia protocol -check FSBS per protocol   ELECTROLYTES -follow labs as needed -replace as needed -pharmacy consultation   DVT/GI PRX ordered -SCDs  TRANSFUSIONS AS NEEDED MONITOR FSBS ASSESS the need for LABS as needed   Critical care provider statement:    Critical care time (minutes):   35   Critical care time was exclusive of:  Separately billable procedures and treating other patients   Critical care was  necessary to treat or prevent imminent or life-threatening deterioration of the following conditions: Acute suicide attempt via drug overdose and subsequent unresponsive state requiring ventilator dependence.   Critical care was time spent personally by me on the following activities:  Development of treatment plan with patient or surrogate, discussions with consultants, evaluation of patient's response to treatment, examination of patient, obtaining history from patient or surrogate, ordering and  performing treatments and interventions, ordering and review of laboratory studies and re-evaluation of patient's condition.  I assumed direction of critical care for this patient from another provider in my specialty: no    This document was prepared using Dragon voice recognition software and may include unintentional dictation errors.    Ottie Glazier, M.D.  Division of [place]

## 2018-10-02 ENCOUNTER — Encounter: Payer: Self-pay | Admitting: Pulmonary Disease

## 2018-10-02 DIAGNOSIS — T1491XA Suicide attempt, initial encounter: Secondary | ICD-10-CM

## 2018-10-02 DIAGNOSIS — F319 Bipolar disorder, unspecified: Secondary | ICD-10-CM

## 2018-10-02 DIAGNOSIS — T43012S Poisoning by tricyclic antidepressants, intentional self-harm, sequela: Secondary | ICD-10-CM

## 2018-10-02 LAB — BASIC METABOLIC PANEL
Anion gap: 8 (ref 5–15)
BUN: 6 mg/dL (ref 6–20)
CO2: 26 mmol/L (ref 22–32)
Calcium: 8.1 mg/dL — ABNORMAL LOW (ref 8.9–10.3)
Chloride: 104 mmol/L (ref 98–111)
Creatinine, Ser: 0.91 mg/dL (ref 0.44–1.00)
GFR calc Af Amer: >60 mL/min (ref >60)
GFR calc non Af Amer: >60 mL/min (ref >60)
Glucose, Bld: 102 mg/dL — ABNORMAL HIGH (ref 70–99)
Potassium: 3.3 mmol/L — ABNORMAL LOW (ref 3.5–5.1)
Sodium: 138 mmol/L (ref 135–145)

## 2018-10-02 LAB — MAGNESIUM: Magnesium: 2.3 mg/dL (ref 1.7–2.4)

## 2018-10-02 LAB — HIV ANTIBODY (ROUTINE TESTING W REFLEX): HIV SCREEN 4TH GENERATION: NONREACTIVE

## 2018-10-02 LAB — PHOSPHORUS: Phosphorus: 3.9 mg/dL (ref 2.5–4.6)

## 2018-10-02 MED ORDER — LORAZEPAM 1 MG PO TABS: 0.5000 mg | ORAL_TABLET | Freq: Three times a day (TID) | ORAL | Status: DC | PRN

## 2018-10-02 MED ORDER — CHLORHEXIDINE GLUCONATE CLOTH 2 % EX PADS: 6.0000 | MEDICATED_PAD | Freq: Once | CUTANEOUS | Status: DC

## 2018-10-02 MED ORDER — ACETAMINOPHEN 325 MG PO TABS
650.0000 mg | ORAL_TABLET | Freq: Four times a day (QID) | ORAL | Status: DC | PRN
  Administered 2018-10-02 – 2018-10-03 (×2): 650 mg via ORAL
  Filled 2018-10-02 (×3): qty 2

## 2018-10-02 MED ORDER — PANTOPRAZOLE SODIUM 40 MG PO TBEC
40.0000 mg | DELAYED_RELEASE_TABLET | Freq: Every day | ORAL | Status: DC
  Administered 2018-10-02 – 2018-10-03 (×2): 40 mg via ORAL
  Filled 2018-10-02 (×2): qty 1

## 2018-10-02 MED ORDER — IPRATROPIUM-ALBUTEROL 0.5-2.5 (3) MG/3ML IN SOLN: 3.0000 mL | RESPIRATORY_TRACT | Status: DC | PRN

## 2018-10-02 MED ORDER — POTASSIUM CHLORIDE 20 MEQ PO PACK
40.0000 meq | PACK | ORAL | Status: DC
  Administered 2018-10-02: 40 meq
  Filled 2018-10-02: qty 2

## 2018-10-02 MED ORDER — POTASSIUM CHLORIDE CRYS ER 20 MEQ PO TBCR
40.0000 meq | EXTENDED_RELEASE_TABLET | Freq: Once | ORAL | Status: AC
  Administered 2018-10-02: 40 meq via ORAL
  Filled 2018-10-02: qty 2

## 2018-10-02 NOTE — Progress Notes (Signed)
CRITICAL CARE NOTE      SUBJECTIVE FINDINGS & SIGNIFICANT EVENTS   Patient remains confused with periods of lethargy this am. Psych eval pending.  Discussed with dietician to attempt restarting PO diet-Upgraded diet to regular.     PAST MEDICAL HISTORY   Past Medical History:  Diagnosis Date  . Bipolar depression ([facility])      SURGICAL HISTORY   Past Surgical History:  Procedure Laterality Date  . TONSILLECTOMY       FAMILY HISTORY   Family History  Problem Relation Age of Onset  . Migraines Father   . Heart disease Maternal Grandmother   . Depression Maternal Grandmother   . Hypertension Maternal Grandmother   . Breast cancer Paternal Grandfather      SOCIAL HISTORY   Social History   Tobacco Use  . Smoking status: Current Every Day Smoker    Packs/day: 0.50  . Smokeless tobacco: Never Used  Substance Use Topics  . Alcohol use: Yes    Comment: occasional  . Drug use: Never     MEDICATIONS   Current Medication:  Current Facility-Administered Medications:  .  bisacodyl (DULCOLAX) EC tablet 5 mg, 5 mg, Oral, Daily PRN, Marjie Skiff, Prasanna, MD .  enoxaparin (LOVENOX) injection 40 mg, 40 mg, Subcutaneous, BID, Marjie Skiff, Prasanna, MD, 40 mg at 10/02/18 0842 .  fentaNYL in NS (66mcg/ml) infusion-PREMIX, 100 mcg/hr, Intravenous, Continuous, Barbaraann Rondo, MD, Stopped at 10/01/18 1501 .  folic acid (FOLVITE) tablet 1 mg, 1 mg, Oral, Daily, Cerria Randhawa, MD, 1 mg at 10/02/18 0842 .  ipratropium-albuterol (DUONEB) 0.5-2.5 (3) MG/3ML nebulizer solution 3 mL, 3 mL, Nebulization, Q6H, Tukov-Yual, Magdalene S, NP, 3 mL at 10/02/18 0743 .  LORazepam (ATIVAN) injection 2-3 mg, 2-3 mg, Intravenous, Q1H PRN, Vida Rigger, MD .  multivitamin with minerals tablet 1 tablet, 1  tablet, Oral, Daily, Vida Rigger, MD, 1 tablet at 10/02/18 0842 .  pantoprazole (PROTONIX) injection 40 mg, 40 mg, Intravenous, Q24H, Bertram Savin, RPH, 40 mg at 10/01/18 2133 .  phenol (CHLORASEPTIC) mouth spray 1 spray, 1 spray, Mouth/Throat, PRN, Karna Christmas, Ashleen Demma, MD .  potassium chloride (KLOR-CON) packet 40 mEq, 40 mEq, Per Tube, Q4H, Bertram Savin, RPH, 40 mEq at 10/02/18 0903 .  propofol (DIPRIVAN) 1000 MG/100ML infusion, 5-80 mcg/kg/min, Intravenous, Continuous, Barbaraann Rondo, MD, Stopped at 10/01/18 8325 .  senna-docusate (Senokot-S) tablet 1 tablet, 1 tablet, Oral, QHS PRN, Marjie Skiff, Prasanna, MD .  thiamine (VITAMIN B-1) tablet 100 mg, 100 mg, Oral, Daily, Karna Christmas, Dannis Deroche, MD, 100 mg at 10/02/18 4982    ALLERGIES   Latex    REVIEW OF SYSTEMS   Patient is slow to answer and inconsistent unable to obtain review of systems  PHYSICAL EXAMINATION   Vitals:   10/02/18 0743 10/02/18 0800  BP:  (!) 113/56  Pulse: 73 80  Resp: 16 15  Temp:  37.1 C  SpO2: 98% 97%    GENERAL: Mildly confused in no apparent distress HEAD: Normocephalic, atraumatic.  EYES: Pupils equal, round, reactive to light.  No scleral icterus.  MOUTH: Moist mucosal membrane. NECK: Supple. No thyromegaly. No nodules. No JVD.  PULMONARY: Clear to auscultation bilaterally CARDIOVASCULAR: S1 and S2. Regular rate and rhythm. No murmurs, rubs, or gallops.  GASTROINTESTINAL: Soft, nontender, non-distended. No masses. Positive bowel sounds. No hepatosplenomegaly.  MUSCULOSKELETAL: No swelling, clubbing, or edema.  NEUROLOGIC: Mild distress due to acute illness SKIN:intact,warm,dry   LABS AND IMAGING     -I personally reviewed most recent  blood work, imaging and microbiology - significant findings today are altered mental status  LAB RESULTS: Recent Labs  Lab 10/01/18 0440 10/01/18 1542 10/02/18 0450  NA 139 139 138  K 3.0* 3.8 3.3*  CL 107 104 104  CO2 25 24 26   BUN 9 7 6    CREATININE 0.68 0.82 0.91  GLUCOSE 111* 122* 102*   Recent Labs  Lab 09/30/18 2218  HGB 13.2  HCT 40.5  WBC 7.3  PLT 336     IMAGING RESULTS: No results found.    ASSESSMENT AND PLAN    --Multidisciplinary rounds held today  Suicide attempt   -Drug overdose   -That is post extubation with altered mental status-improved  GI/Nutrition GI PROPHYLAXIS as indicated DIET-->TF's as tolerated Constipation protocol as indicated  ENDO - ICU hypoglycemic\Hyperglycemia protocol -check FSBS per protocol   ELECTROLYTES -follow labs as needed -replace as needed -pharmacy consultation   DVT/GI PRX ordered -SCDs  TRANSFUSIONS AS NEEDED MONITOR FSBS ASSESS the need for LABS as needed   Critical care provider statement:    Critical care time (minutes):  0   Critical care time was exclusive of:  Separately billable procedures and treating other patients   Critical care was necessary to treat or prevent imminent or life-threatening deterioration of the following conditions:   Attempted suicide via drug overdose, altered mental status with confusion   Critical care was time spent personally by me on the following activities:  Development of treatment plan with patient or surrogate, discussions with consultants, evaluation of patient's response to treatment, examination of patient, obtaining history from patient or surrogate, ordering and performing treatments and interventions, ordering and review of laboratory studies and re-evaluation of patient's condition.  I assumed direction of critical care for this patient from another provider in my specialty: no    This document was prepared using Dragon voice recognition software and may include unintentional dictation errors.    Vida Rigger, M.D.  Division of Pulmonary & Critical Care Medicine  [facility]

## 2018-10-02 NOTE — Plan of Care (Signed)
Pt is progressing well. 

## 2018-10-02 NOTE — Progress Notes (Addendum)
[facility] - [place] [facility]   PATIENT NAME: Maria Espinoza    MR#:  [id number]  DATE OF BIRTH:  1990/09/30  SUBJECTIVE:   Patient came in with overdose with Doxepin. Intubated and on the ventilator for airway protection---now extubated  REVIEW OF SYSTEMS:   Review of Systems  Constitutional: Negative for chills, fever and weight loss.  HENT: Negative for ear discharge, ear pain and nosebleeds.   Eyes: Negative for blurred vision, pain and discharge.  Respiratory: Negative for sputum production, shortness of breath, wheezing and stridor.   Cardiovascular: Negative for chest pain, palpitations, orthopnea and PND.  Gastrointestinal: Negative for abdominal pain, diarrhea, nausea and vomiting.  Genitourinary: Negative for frequency and urgency.  Musculoskeletal: Negative for back pain and joint pain.  Neurological: Positive for weakness. Negative for sensory change, speech change and focal weakness.  Psychiatric/Behavioral: Negative for depression and hallucinations. The patient is not nervous/anxious.    DRUG ALLERGIES:   Allergies  Allergen Reactions  . Latex Rash    VITALS:  Blood pressure 115/63, pulse 85, temperature 98.1 F (36.7 C), temperature source Oral, resp. rate 19, height 5\' 6"  (1.676 m), weight 132.6 kg, SpO2 96 %.  PHYSICAL EXAMINATION:   Physical Exam  GENERAL:  28 y.o.-year-old patient lying in the bed with no acute distress. obese EYES: Pupils equal, round, reactive to light and accommodation. No scleral icterus. Extraocular muscles intact.  HEENT: Head atraumatic, normocephalic. Oropharynx and nasopharynx clear.  NECK:  Supple, no jugular venous distention. No thyroid enlargement, no tenderness.  LUNGS: Normal breath sounds bilaterally, no wheezing, rales, rhonchi. No use of accessory muscles of respiration.  CARDIOVASCULAR: S1, S2 normal. No murmurs, rubs, or gallops.  ABDOMEN: Soft, nontender, nondistended. Bowel sounds  present. No organomegaly or mass.  EXTREMITIES: No cyanosis, clubbing or edema b/l.    NEUROLOGIC: no focal weakness. Moves all extremities well. PSYCHIATRIC:  patient is sleepy but awake alert and respond to all questions SKIN: No obvious rash, lesion, or ulcer.   LABORATORY PANEL:  CBC Recent Labs  Lab 09/30/18 2218  WBC 7.3  HGB 13.2  HCT 40.5  PLT 336    Chemistries  Recent Labs  Lab 10/01/18 0440  10/02/18 0450  NA 139   < > 138  K 3.0*   < > 3.3*  CL 107   < > 104  CO2 25   < > 26  GLUCOSE 111*   < > 102*  BUN 9   < > 6  CREATININE 0.68   < > 0.91  CALCIUM 8.0*   < > 8.1*  MG 1.7  --  2.3  AST 18  --   --   ALT 12  --   --   ALKPHOS 61  --   --   BILITOT 0.7  --   --    < > = values in this interval not displayed.   Cardiac Enzymes No results for input(s): TROPONINI in the last 168 hours. RADIOLOGY:  Dg Chest 1 View  Result Date: 10/01/2018 CLINICAL DATA:  Endotracheal tube placement. EXAM: CHEST  1 VIEW COMPARISON:  None. FINDINGS: Endotracheal tube tip projects 7 mm above the carina. Cardiac silhouette is normal in size. No mediastinal or hilar masses. Lung volumes are low. There is prominence of the bronchovascular markings. No lung consolidation or convincing edema. No pleural effusion or pneumothorax. Skeletal structures are grossly intact. IMPRESSION: 1. Endotracheal tube tip projects 7 mm above the carina. 2. No  acute cardiopulmonary disease. Electronically Signed   By: Amie Portland M.D.   On: 10/01/2018 00:15   Dg Abd 1 View  Result Date: 10/01/2018 CLINICAL DATA:  Orogastric tube placement EXAM: ABDOMEN - 1 VIEW ABDOMEN - 1 VIEW COMPARISON:  None. FINDINGS: Two abdominal radiographs are provided. The first was obtained at 3:31 a.m. and the second at 3:39 a.m. On the first radiograph, the orogastric tube tip and side port project over the thoracic esophagus. On the second radiograph, the tube has been advanced and the side port projects over the stomach  with the tip near the gastroduodenal junction. Lungs are clear. No dilated small bowel is visible. IMPRESSION: Second radiograph shows the orogastric tube projecting over the stomach with tip near the gastroduodenal junction. Electronically Signed   By: Deatra Robinson M.D.   On: 10/01/2018 04:21   Dg Abd 1 View  Result Date: 10/01/2018 CLINICAL DATA:  Orogastric tube placement EXAM: ABDOMEN - 1 VIEW ABDOMEN - 1 VIEW COMPARISON:  None. FINDINGS: Two abdominal radiographs are provided. The first was obtained at 3:31 a.m. and the second at 3:39 a.m. On the first radiograph, the orogastric tube tip and side port project over the thoracic esophagus. On the second radiograph, the tube has been advanced and the side port projects over the stomach with the tip near the gastroduodenal junction. Lungs are clear. No dilated small bowel is visible. IMPRESSION: Second radiograph shows the orogastric tube projecting over the stomach with tip near the gastroduodenal junction. Electronically Signed   By: Deatra Robinson M.D.   On: 10/01/2018 04:21   ASSESSMENT AND PLAN:   Salimata Waibel  is a 28 y.o. female with a known history of morbid obesity, bipolar D/O, p/w suicide attempt via intentional Doxepin overdose, episode of aspiration w/ acute respiratory failure necessitating intubation  1. Intentional overdose with Doxepin -patient has history of bipolar disorder -intubated and on the ventilator for airway protection---extubated -continue symptomatic care -psychiatry consultation with Dr. Flora Lipps  2. Hypokalemia -received IV fluids and replace potassium  3. History of bipolar disorder per psych  4. DVT prophylaxis subcu Lovenox   Case discussed with Care Management/Social Worker.  CODE STATUS: full  DVT Prophylaxis: lovenox  TOTAL TIME TAKING CARE OF THIS PATIENT: *30* minutes.  >50% time spent on counselling and coordination of care  POSSIBLE D/C IN *?* DAYS, DEPENDING ON CLINICAL CONDITION.  Note:  This dictation was prepared with Dragon dictation along with smaller phrase technology. Any transcriptional errors that result from this process are unintentional.  Enedina Finner M.D on 10/02/2018 at 2:29 PM  Between 7am to 6pm - Pager - [phone]  After 6pm go to [email or web address] - password [email or web address]  [facility]  Office  [phone]  CC: Primary care physician; Patient, No Pcp PerPatient ID: Ree Shay, female   DOB: 03/04/1991, 28 y.o.   MRN: [id number]

## 2018-10-02 NOTE — Progress Notes (Signed)
Pharmacy Electrolyte Monitoring Consult:  Pharmacy consulted to assist in monitoring and replacing electrolytes in this 28 y.o. female admitted on 09/30/2018 with Drug Overdose   Labs:  Sodium (mmol/L)  Date Value  10/02/2018 138   Potassium (mmol/L)  Date Value  10/02/2018 3.3 (L)   Magnesium (mg/dL)  Date Value  75/17/0017 2.3   Phosphorus (mg/dL)  Date Value  49/44/9675 3.9   Calcium (mg/dL)  Date Value  91/63/8466 8.1 (L)   Albumin (g/dL)  Date Value  59/93/5701 3.1 (L)    Assessment/Plan: Potassium PO x 1. Will obtain follow up electrolytes with am labs.    Maria Espinoza L 10/02/2018 4:34 PM

## 2018-10-02 NOTE — Consult Note (Addendum)
[facility] Face-to-Face Psychiatry Consult   Reason for Consult:  Overdose on Doxepin Referring Physician:  Enedina Finner, MD Patient Identification: Maria Espinoza MRN:  [id number] Principal Diagnosis: <principal problem not specified> Diagnosis:  Active Problems:   Intentional doxepin overdose (HCC)   Total Time spent with patient, reviewing her chart, talking with pt's treatment team, and talking with pt's family: 1 hour  Subjective:  " I just wanted to sleep."   Maria Espinoza is a 28 y.o. female patient admitted s/p overdose on doxepin.    HPI:  Patient evaluated in ICU after presenting with overdose on "full bottle" of doxepin.  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  Pt states that her "highs" consist of increase goal directed activity, cleaning obsessively..."I get OCD".  Pt also states she also has decreased sleep.  She states over last few days, she couldn't rest. She reportedly went 2 days without sleep.  Pt states "I just wanted to sleep" so she took the overdose of doxepin.  However, it's unclear if this was truly the only reason pt took the overdose b/c when I initially asked why she would take the entire bottle, pt stated "I don't want to talk about it" inferring there was another trigger for her overdose.  Pt denies active SI. She denies HI, AH, VH, paranoia.  She identifies her step-parents, aunt and sisters as her support system.   Pt also reports recent low mood, but would not provide details.  She becomes tearful when attempting to discuss her symptoms further.   Pt states she gets panic attacks only a few times a year.  Denies benzodiazepine use.  Denies alcohol, marijuana, or other illicit substance use.  But UDS is positive for THC.   Smokes 1 pack of cigarettes every 2 days.    Additionally, pt provides verbal consent to contact her "step parents" Kirt Boys and Windell Moulding [phone] and her aunt Aggie Cosier [phone]  to discuss plan of care and to get collateral.   Maria Espinoza reports pt has bipolar, depression and anxiety.  Pt's biological mother died when pt was 49 years old and Maria Espinoza doesn't believe pt ever got over it.  Also, pt reportedly was feeling left out from a family event recently; pt reportedly voiced depressive symptoms and told family that she would "be dead by the morning."  Maria Espinoza is concerned for pt and is supportive of psychiatric care.  She states pt doesn't like taking psychiatric medication and will only take it if she feels anxious, so she's not taking consistently.  Pt's daughters-ages 10, 7 and 28 yo are at [employer] home.  Aunt states pt is a good mother, so she hopes pt gets better so she can return home to care for her children.     Past Psychiatric History: [facility] in [place] and [place]--stopped going ~2 months ago.  Pt states she was diagnosed with "manic depression".  She takes doxepin for sleep and she believes she's prescribed lamictal for her mood. She denies history of psychiatric admissions.  She denies history of suicide attempts.  She denies history of self-injurious behaviors such as cutting.    Risk to Self:  yes Risk to Others:  pt denies Prior Inpatient Therapy:  pt denies Prior Outpatient Therapy:   [facility] in [place] and [place]  Past Medical History:  Past Medical History:  Diagnosis Date  . Bipolar depression (HCC)     Past Surgical History:  Procedure Laterality Date  . TONSILLECTOMY     Family History:  Family History  Problem Relation Age of Onset  . Migraines Father   . Heart disease Maternal Grandmother   . Depression Maternal Grandmother   . Hypertension Maternal Grandmother   . Breast cancer Paternal Grandfather    Family Psychiatric  History: pt states she doesn't know her biological family's psychiatric history Social History:  Social History   Substance and Sexual Activity  Alcohol Use Yes   Comment:  occasional     Social History   Substance and Sexual Activity  Drug Use Never    Social History   Socioeconomic History  . Marital status: Single    Spouse name: Not on file  . Number of children: Not on file  . Years of education: Not on file  . Highest education level: Not on file  Occupational History  . Not on file  Social Needs  . Financial resource strain: Not on file  . Food insecurity:    Worry: Not on file    Inability: Not on file  . Transportation needs:    Medical: Not on file    Non-medical: Not on file  Tobacco Use  . Smoking status: Current Every Day Smoker    Packs/day: 0.50  . Smokeless tobacco: Never Used  Substance and Sexual Activity  . Alcohol use: Yes    Comment: occasional  . Drug use: Never  . Sexual activity: Not on file  Lifestyle  . Physical activity:    Days per week: Not on file    Minutes per session: Not on file  . Stress: Not on file  Relationships  . Social connections:    Talks on phone: Not on file    Gets together: Not on file    Attends religious service: Not on file    Active member of club or organization: Not on file    Attends meetings of clubs or organizations: Not on file    Relationship status: Not on file  Other Topics Concern  . Not on file  Social History Narrative  . Not on file   Additional Social History:  Pt reports she has three daughters, ages 34, 80 and 39 years old.  She has a boyfriend.  She was suppose to start new job at [employer] on 10/02/2018.  Pt states her biological mother is deceased and her biological father is incarcerated for "life".  She states she was raised by her "step-parents"-Shemika and Windell Moulding.      Allergies:   Allergies  Allergen Reactions  . Latex Rash    Labs:  Results for orders placed or performed during the hospital encounter of 09/30/18 (from the past 48 hour(s))  Comprehensive metabolic panel     Status: None   Collection Time: 09/30/18 10:18 PM   Result Value Ref Range   Sodium 137 135 - 145 mmol/L   Potassium 3.5 3.5 - 5.1 mmol/L   Chloride 104 98 - 111 mmol/L   CO2 24 22 - 32 mmol/L   Glucose, Bld 89 70 - 99 mg/dL   BUN 11 6 - 20 mg/dL   Creatinine, Ser 1.61 0.44 - 1.00 mg/dL   Calcium 9.0 8.9 - 09.6 mg/dL   Total Protein 8.0 6.5 - 8.1 g/dL   Albumin 3.8 3.5 - 5.0 g/dL   AST 19 15 - 41 U/L   ALT 13 0 - 44 U/L   Alkaline Phosphatase 80 38 - 126  U/L   Total Bilirubin 0.6 0.3 - 1.2 mg/dL   GFR calc non Af Amer >60 >60 mL/min   GFR calc Af Amer >60 >60 mL/min   Anion gap 9 5 - 15    Comment: Performed at [facility], [address] [place], [place] [address]  Ethanol     Status: None   Collection Time: 09/30/18 10:18 PM  Result Value Ref Range   Alcohol, Ethyl (B) <10 <10 mg/dL    Comment: (NOTE) Lowest detectable limit for serum alcohol is 10 mg/dL. For medical purposes only. Performed at [facility], [address] [place], [place] [address]   Salicylate level     Status: None   Collection Time: 09/30/18 10:18 PM  Result Value Ref Range   Salicylate Lvl <7.0 2.8 - 30.0 mg/dL    Comment: Performed at [facility], [address] [place], [place] [address]  Acetaminophen level     Status: Abnormal   Collection Time: 09/30/18 10:18 PM  Result Value Ref Range   Acetaminophen (Tylenol), Serum <10 (L) 10 - 30 ug/mL    Comment: (NOTE) Therapeutic concentrations vary significantly. A range of 10-30 ug/mL  may be an effective concentration for many patients. However, some  are best treated at concentrations outside of this range. Acetaminophen concentrations >150 ug/mL at 4 hours after ingestion  and >50 ug/mL at 12 hours after ingestion are often associated with  toxic reactions. Performed at [facility], [address] [place], [place] [address]   cbc     Status: None   Collection Time: 09/30/18 10:18 PM  Result Value Ref Range   WBC 7.3 4.0 - 10.5 K/uL   RBC 4.54  3.87 - 5.11 MIL/uL   Hemoglobin 13.2 12.0 - 15.0 g/dL   HCT 29.540.5 62.136.0 - 30.846.0 %   MCV 89.2 80.0 - 100.0 fL   MCH 29.1 26.0 - 34.0 pg   MCHC 32.6 30.0 - 36.0 g/dL   RDW 65.713.7 84.611.5 - 96.215.5 %   Platelets 336 150 - 400 K/uL   nRBC 0.0 0.0 - 0.2 %    Comment: Performed at [facility], [address] [place], [place] [address]  Magnesium     Status: None   Collection Time: 09/30/18 10:18 PM  Result Value Ref Range   Magnesium 1.9 1.7 - 2.4 mg/dL    Comment: Performed at [facility], [address] [place], [place] [address]  Phosphorus     Status: None   Collection Time: 09/30/18 10:18 PM  Result Value Ref Range   Phosphorus 2.9 2.5 - 4.6 mg/dL    Comment: Performed at [facility], [address] [place], [place] [address]  Urine Drug Screen, Qualitative     Status: Abnormal   Collection Time: 09/30/18 11:26 PM  Result Value Ref Range   Tricyclic, Ur Screen POSITIVE (A) NONE DETECTED   Amphetamines, Ur Screen NONE DETECTED NONE DETECTED   MDMA (Ecstasy)Ur Screen NONE DETECTED NONE DETECTED   Cocaine Metabolite,Ur [place] NONE DETECTED NONE DETECTED   Opiate, Ur Screen NONE DETECTED NONE DETECTED   Phencyclidine (PCP) Ur S NONE DETECTED NONE DETECTED   Cannabinoid 50 Ng, Ur [place] POSITIVE (A) NONE DETECTED   Barbiturates, Ur Screen NONE DETECTED NONE DETECTED   Benzodiazepine, Ur Scrn NONE DETECTED NONE DETECTED   Methadone Scn, Ur NONE DETECTED NONE DETECTED    Comment: (NOTE) Tricyclics + metabolites, urine    Cutoff 1000 ng/mL Amphetamines +  metabolites, urine  Cutoff 1000 ng/mL MDMA (Ecstasy), urine              Cutoff 500 ng/mL Cocaine Metabolite, urine          Cutoff 300 ng/mL Opiate + metabolites, urine        Cutoff 300 ng/mL Phencyclidine (PCP), urine         Cutoff 25 ng/mL Cannabinoid, urine                 Cutoff 50 ng/mL Barbiturates + metabolites, urine  Cutoff 200 ng/mL Benzodiazepine, urine              Cutoff 200 ng/mL Methadone, urine                    Cutoff 300 ng/mL The urine drug screen provides only a preliminary, unconfirmed analytical test result and should not be used for non-medical purposes. Clinical consideration and professional judgment should be applied to any positive drug screen result due to possible interfering substances. A more specific alternate chemical method must be used in order to obtain a confirmed analytical result. Gas chromatography / mass spectrometry (GC/MS) is the preferred confirmat ory method. Performed at [facility], [address] [place], [place] [address]   Acetaminophen level     Status: Abnormal   Collection Time: 09/30/18 11:26 PM  Result Value Ref Range   Acetaminophen (Tylenol), Serum <10 (L) 10 - 30 ug/mL    Comment: (NOTE) Therapeutic concentrations vary significantly. A range of 10-30 ug/mL  may be an effective concentration for many patients. However, some  are best treated at concentrations outside of this range. Acetaminophen concentrations >150 ug/mL at 4 hours after ingestion  and >50 ug/mL at 12 hours after ingestion are often associated with  toxic reactions. Performed at [facility], [address] [place], [place] [address]   Glucose, capillary     Status: Abnormal   Collection Time: 09/30/18 11:51 PM  Result Value Ref Range   Glucose-Capillary 107 (H) 70 - 99 mg/dL  Blood gas, arterial     Status: Abnormal   Collection Time: 10/01/18 12:27 AM  Result Value Ref Range   FIO2 0.50    Delivery systems VENTILATOR    Mode ASSIST CONTROL    VT 550 mL   LHR 18 resp/min   Peep/cpap 5.0 cm H20   pH, Arterial 7.39 7.350 - 7.450   pCO2 arterial 37 32.0 - 48.0 mmHg   pO2, Arterial 147 (H) 83.0 - 108.0 mmHg   Bicarbonate 22.4 20.0 - 28.0 mmol/L   Acid-base deficit 2.2 (H) 0.0 - 2.0 mmol/L   O2 Saturation 99.3 %   Patient temperature 37.0    Collection site RIGHT RADIAL    Sample type ARTERIAL DRAW    Allens test (pass/fail) PASS PASS     Comment: Performed at [facility], [address] [place], [place] [address]  Pregnancy, urine POC     Status: None   Collection Time: 10/01/18 12:50 AM  Result Value Ref Range   Preg Test, Ur NEGATIVE NEGATIVE    Comment:        THE SENSITIVITY OF THIS METHODOLOGY IS >24 mIU/mL   Glucose, capillary     Status: Abnormal   Collection Time: 10/01/18  2:48 AM  Result Value Ref Range   Glucose-Capillary 108 (H) 70 - 99 mg/dL  MRSA PCR Screening     Status: None   Collection Time: 10/01/18  3:01 AM  Result Value Ref Range   MRSA by PCR NEGATIVE NEGATIVE    Comment:        The GeneXpert MRSA Assay (FDA approved for NASAL specimens only), is one component of a comprehensive MRSA colonization surveillance program. It is not intended to diagnose MRSA infection nor to guide or monitor treatment for MRSA infections. Performed at [facility], [address] [place], [place] [address]   HIV antibody (Routine Testing)     Status: None   Collection Time: 10/01/18  4:40 AM  Result Value Ref Range   HIV Screen 4th Generation wRfx Non Reactive Non Reactive    Comment: (NOTE) Performed At: [facility] [address] [place], [place] [address] Jolene Schimke MD [id number]   Comprehensive metabolic panel     Status: Abnormal   Collection Time: 10/01/18  4:40 AM  Result Value Ref Range   Sodium 139 135 - 145 mmol/L   Potassium 3.0 (L) 3.5 - 5.1 mmol/L   Chloride 107 98 - 111 mmol/L   CO2 25 22 - 32 mmol/L   Glucose, Bld 111 (H) 70 - 99 mg/dL   BUN 9 6 - 20 mg/dL   Creatinine, Ser 3.08 0.44 - 1.00 mg/dL   Calcium 8.0 (L) 8.9 - 10.3 mg/dL   Total Protein 6.2 (L) 6.5 - 8.1 g/dL   Albumin 3.1 (L) 3.5 - 5.0 g/dL   AST 18 15 - 41 U/L   ALT 12 0 - 44 U/L   Alkaline Phosphatase 61 38 - 126 U/L   Total Bilirubin 0.7 0.3 - 1.2 mg/dL   GFR calc non Af Amer >60 >60 mL/min   GFR calc Af Amer >60 >60 mL/min   Anion gap 7 5 - 15    Comment: Performed at  [facility], [address], [place], [place] [address]  Magnesium     Status: None   Collection Time: 10/01/18  4:40 AM  Result Value Ref Range   Magnesium 1.7 1.7 - 2.4 mg/dL    Comment: Performed at [facility], [address] [place], [place] [address]  Phosphorus     Status: None   Collection Time: 10/01/18  4:40 AM  Result Value Ref Range   Phosphorus 3.2 2.5 - 4.6 mg/dL    Comment: Performed at [facility], [address] [place], [place] [address]  Blood gas, arterial     Status: Abnormal (Preliminary result)   Collection Time: 10/01/18  5:00 AM  Result Value Ref Range   FIO2 0.40    Delivery systems VENTILATOR    Mode PRESSURE REGULATED VOLUME CONTROL    VT 550 mL   LHR 18 resp/min   Peep/cpap 5.0 cm H20   pH, Arterial 7.49 (H) 7.350 - 7.450   pCO2 arterial 34 32.0 - 48.0 mmHg   pO2, Arterial 141 (H) 83.0 - 108.0 mmHg   Bicarbonate 25.9 20.0 - 28.0 mmol/L   Acid-Base Excess 2.9 (H) 0.0 - 2.0 mmol/L   O2 Saturation 99.3 %   Patient temperature 37.0    Collection site PENDING    Sample type ARTERIAL DRAW    Allens test (pass/fail) PASS PASS    Comment: Performed at [facility] [address], [place], [place] [address]  Basic metabolic panel     Status: Abnormal   Collection Time: 10/01/18  3:42 PM  Result Value Ref Range   Sodium 139 135 - 145 mmol/L   Potassium 3.8 3.5 -  5.1 mmol/L   Chloride 104 98 - 111 mmol/L   CO2 24 22 - 32 mmol/L   Glucose, Bld 122 (H) 70 - 99 mg/dL   BUN 7 6 - 20 mg/dL   Creatinine, Ser 1.19 0.44 - 1.00 mg/dL   Calcium 8.0 (L) 8.9 - 10.3 mg/dL   GFR calc non Af Amer >60 >60 mL/min   GFR calc Af Amer >60 >60 mL/min   Anion gap 11 5 - 15    Comment: Performed at [facility], [address] [place], [place] [address]  Basic metabolic panel     Status: Abnormal   Collection Time: 10/02/18  4:50 AM  Result Value Ref Range   Sodium 138 135 - 145 mmol/L   Potassium 3.3 (L) 3.5 - 5.1  mmol/L   Chloride 104 98 - 111 mmol/L   CO2 26 22 - 32 mmol/L   Glucose, Bld 102 (H) 70 - 99 mg/dL   BUN 6 6 - 20 mg/dL   Creatinine, Ser 9.56 0.44 - 1.00 mg/dL   Calcium 8.1 (L) 8.9 - 10.3 mg/dL   GFR calc non Af Amer >60 >60 mL/min   GFR calc Af Amer >60 >60 mL/min   Anion gap 8 5 - 15    Comment: Performed at [facility], [address] [place], [place] [address]  Magnesium     Status: None   Collection Time: 10/02/18  4:50 AM  Result Value Ref Range   Magnesium 2.3 1.7 - 2.4 mg/dL    Comment: Performed at [facility], [address], [place], [place] [address]  Phosphorus     Status: None   Collection Time: 10/02/18  4:50 AM  Result Value Ref Range   Phosphorus 3.9 2.5 - 4.6 mg/dL    Comment: Performed at [facility], [address] [place], [place] [address]    Current Facility-Administered Medications  Medication Dose Route Frequency Provider Last Rate Last Dose  . bisacodyl (DULCOLAX) EC tablet 5 mg  5 mg Oral Daily PRN Barbaraann Rondo, MD      . enoxaparin (LOVENOX) injection 40 mg  40 mg Subcutaneous BID Barbaraann Rondo, MD   40 mg at 10/02/18 0842  . folic acid (FOLVITE) tablet 1 mg  1 mg Oral Daily Vida Rigger, MD   1 mg at 10/02/18 0842  . ipratropium-albuterol (DUONEB) 0.5-2.5 (3) MG/3ML nebulizer solution 3 mL  3 mL Nebulization Q4H PRN Vida Rigger, MD      . LORazepam (ATIVAN) injection 2-3 mg  2-3 mg Intravenous Q1H PRN Vida Rigger, MD      . multivitamin with minerals tablet 1 tablet  1 tablet Oral Daily Vida Rigger, MD   1 tablet at 10/02/18 0842  . pantoprazole (PROTONIX) EC tablet 40 mg  40 mg Oral Daily Bertram Savin, RPH      . phenol (CHLORASEPTIC) mouth spray 1 spray  1 spray Mouth/Throat PRN Vida Rigger, MD      . potassium chloride SA (K-DUR,KLOR-CON) CR tablet 40 mEq  40 mEq Oral Once Bertram Savin, RPH      . senna-docusate (Senokot-S) tablet 1 tablet  1 tablet Oral QHS PRN  Barbaraann Rondo, MD      . thiamine (VITAMIN B-1) tablet 100 mg  100 mg Oral Daily Vida Rigger, MD   100 mg at 10/02/18 5284    Musculoskeletal: Strength & Muscle Tone: unable to assess Gait & Station: unable to assess Patient leans: unable  to assess  Psychiatric Specialty Exam: Physical Exam  Nursing note and vitals reviewed. Constitutional: She appears well-developed.  Pt is sweating upon evaluation.  She states she feels hot.    HENT:  Head: Normocephalic.  Cardiovascular: Normal rate.  Respiratory: Effort normal.  Upper lungs sounded clear bilaterally, but unable to fully auscultate all lung fields.   GI: Bowel sounds are normal.    Review of Systems  Constitutional: Positive for malaise/fatigue.       Pt complaining of feeling hot; she's noted to be sweating upon evaluation   Eyes: Negative.   Respiratory: Negative.   Cardiovascular: Negative.   Gastrointestinal: Negative.   Genitourinary: Negative.   Musculoskeletal: Positive for myalgias.  Neurological: Positive for headaches.  Psychiatric/Behavioral: Positive for depression. Negative for hallucinations and suicidal ideas. The patient is nervous/anxious.     Blood pressure (!) 101/54, pulse 71, temperature 98.8 F (37.1 C), temperature source Axillary, resp. rate 14, height 5\' 6"  (1.676 m), weight 132.6 kg, SpO2 96 %.Body mass index is 47.18 kg/m.  General Appearance: obese female, laying in bed; no acute distress  Eye Contact:  Poor  Speech:  clear, but low volume  Volume:  Decreased  Mood:  Dysphoric  Affect:  Depressed and Flat  Thought Process:  Linear  Orientation:  Other:  oriented to person, place, grossly to time  Thought Content:  Logical  Suicidal Thoughts:  pt denies  Homicidal Thoughts:  pt denies  Memory:  pt states she doesn't recall all the events surrounding her overdose.   Judgement:  Poor  Insight:  Shallow  Psychomotor Activity:  Decreased  Concentration:  Concentration: Poor and  Attention Span: Poor  Recall:  Poor  Fund of Knowledge:  fair  Language:  Fair  Akathisia:  No  AIMS (if indicated):   N/A  Assets:  Desire for Improvement Social Support  ADL's:  Intact  Cognition: grossly intact, but sleepy-awakens to verbal and light tactile stimuli  Sleep:   pt has been sedated     Treatment Plan Summary: Intentional overdose on doxepin Suicide attempt Bipolar Disorder, unspecified, currently depressed Rule out Cannabis use disorder (UDS is positive for THC)  Daily contact with patient to assess and evaluate symptoms and progress in treatment, Medication management and Plan 28 yo female status post doxepin overdose.  Pt was intubated for airway protection.  Extubated on 10/01/2018.  Today pt remains sleepy, likely due to overdose of doxepin.  Patient will need admission to inpatient behavioral unit once medically cleared.  Discussed plan of care with Primary Attending and RN.  Also discussed plan of care with patient.  Additionally, pt provides verbal consent to contact her "step parents" Kirt Boys and Windell Moulding [phone] and her aunt Aggie Cosier [phone] to discuss plan of care and to get collateral.  Pt also wants me to ask them if they can call her manager at local [facility] to let her know she's in the hospital.    I called the step parents but no answer.  I called pt's aunt who provided collateral.  See HPI above.  Aunt believes pt is depressed and she supports pt receiving psychiatric treatment. Aunt also agrees to call pt's job to let them know she will not be in today.   Pt unsure of name of provider who prescribes her medication. Also pt is unsure the name of the pharmacy where she gets her meds.  This will need to be explored further.    Disposition: Recommend psychiatric Inpatient  admission when medically cleared. Supportive therapy provided about ongoing stressors.  Hessie Knows, MD 10/02/2018 12:48 PM

## 2018-10-03 ENCOUNTER — Other Ambulatory Visit: Payer: Self-pay

## 2018-10-03 ENCOUNTER — Inpatient Hospital Stay
Admission: AD | Admit: 2018-10-03 | Discharge: 2018-10-04 | DRG: 885 | Disposition: A | Discharge status: Home / Self Care | Payer: No Typology Code available for payment source | Attending: Psychiatry | Admitting: Psychiatry

## 2018-10-03 DIAGNOSIS — Z818 Family history of other mental and behavioral disorders: Secondary | ICD-10-CM

## 2018-10-03 DIAGNOSIS — Z915 Personal history of self-harm: Secondary | ICD-10-CM | POA: Diagnosis not present

## 2018-10-03 DIAGNOSIS — G4700 Insomnia, unspecified: Secondary | ICD-10-CM | POA: Diagnosis present

## 2018-10-03 DIAGNOSIS — Z716 Tobacco abuse counseling: Secondary | ICD-10-CM | POA: Diagnosis not present

## 2018-10-03 DIAGNOSIS — F4310 Post-traumatic stress disorder, unspecified: Secondary | ICD-10-CM | POA: Diagnosis present

## 2018-10-03 DIAGNOSIS — F4010 Social phobia, unspecified: Secondary | ICD-10-CM | POA: Diagnosis present

## 2018-10-03 DIAGNOSIS — Z91040 Latex allergy status: Secondary | ICD-10-CM

## 2018-10-03 DIAGNOSIS — F332 Major depressive disorder, recurrent severe without psychotic features: Secondary | ICD-10-CM | POA: Diagnosis not present

## 2018-10-03 DIAGNOSIS — F319 Bipolar disorder, unspecified: Secondary | ICD-10-CM | POA: Diagnosis present

## 2018-10-03 DIAGNOSIS — F418 Other specified anxiety disorders: Secondary | ICD-10-CM | POA: Diagnosis present

## 2018-10-03 DIAGNOSIS — Z79899 Other long term (current) drug therapy: Secondary | ICD-10-CM | POA: Diagnosis not present

## 2018-10-03 DIAGNOSIS — F17210 Nicotine dependence, cigarettes, uncomplicated: Secondary | ICD-10-CM | POA: Diagnosis present

## 2018-10-03 DIAGNOSIS — Z793 Long term (current) use of hormonal contraceptives: Secondary | ICD-10-CM

## 2018-10-03 DIAGNOSIS — T43012A Poisoning by tricyclic antidepressants, intentional self-harm, initial encounter: Secondary | ICD-10-CM | POA: Diagnosis present

## 2018-10-03 DIAGNOSIS — F3130 Bipolar disorder, current episode depressed, mild or moderate severity, unspecified: Secondary | ICD-10-CM | POA: Diagnosis present

## 2018-10-03 DIAGNOSIS — T1491XA Suicide attempt, initial encounter: Secondary | ICD-10-CM

## 2018-10-03 DIAGNOSIS — Z91410 Personal history of adult physical and sexual abuse: Secondary | ICD-10-CM | POA: Diagnosis not present

## 2018-10-03 DIAGNOSIS — F1290 Cannabis use, unspecified, uncomplicated: Secondary | ICD-10-CM | POA: Diagnosis present

## 2018-10-03 DIAGNOSIS — T43012S Poisoning by tricyclic antidepressants, intentional self-harm, sequela: Secondary | ICD-10-CM | POA: Diagnosis not present

## 2018-10-03 LAB — BASIC METABOLIC PANEL
Anion gap: 6 (ref 5–15)
BUN: 14 mg/dL (ref 6–20)
CALCIUM: 8 mg/dL — AB (ref 8.9–10.3)
CO2: 24 mmol/L (ref 22–32)
Chloride: 108 mmol/L (ref 98–111)
Creatinine, Ser: 0.83 mg/dL (ref 0.44–1.00)
GFR calc Af Amer: >60 mL/min (ref >60)
GFR calc non Af Amer: >60 mL/min (ref >60)
Glucose, Bld: 90 mg/dL (ref 70–99)
Potassium: 3.9 mmol/L (ref 3.5–5.1)
Sodium: 138 mmol/L (ref 135–145)

## 2018-10-03 LAB — BLOOD GAS, ARTERIAL
Acid-Base Excess: 2.9 mmol/L — ABNORMAL HIGH (ref 0.0–2.0)
Bicarbonate: 25.9 mmol/L (ref 20.0–28.0)
FIO2: 0.4
MECHVT: 550 mL
O2 Saturation: 99.3 %
PEEP: 5 cmH2O
Patient temperature: 37
RATE: 18 resp/min
pCO2 arterial: 34 mmHg (ref 32.0–48.0)
pH, Arterial: 7.49 — ABNORMAL HIGH (ref 7.350–7.450)
pO2, Arterial: 141 mmHg — ABNORMAL HIGH (ref 83.0–108.0)

## 2018-10-03 LAB — PREGNANCY, URINE: Preg Test, Ur: NEGATIVE

## 2018-10-03 MED ORDER — NICOTINE 21 MG/24HR TD PT24: 21.0000 mg | MEDICATED_PATCH | Freq: Every day | TRANSDERMAL | 0 refills | Status: DC

## 2018-10-03 MED ORDER — NICOTINE 21 MG/24HR TD PT24
21.0000 mg | MEDICATED_PATCH | Freq: Every day | TRANSDERMAL | Status: DC
  Administered 2018-10-04: 21 mg via TRANSDERMAL
  Filled 2018-10-03: qty 1

## 2018-10-03 MED ORDER — FOLIC ACID 1 MG PO TABS
1.0000 mg | ORAL_TABLET | Freq: Every day | ORAL | Status: DC
  Administered 2018-10-04: 1 mg via ORAL
  Filled 2018-10-03: qty 1

## 2018-10-03 MED ORDER — MAGNESIUM HYDROXIDE 400 MG/5ML PO SUSP: 30.0000 mL | Freq: Every day | ORAL | Status: DC | PRN

## 2018-10-03 MED ORDER — NICOTINE 21 MG/24HR TD PT24
21.0000 mg | MEDICATED_PATCH | Freq: Every day | TRANSDERMAL | Status: DC
  Administered 2018-10-03: 21 mg via TRANSDERMAL
  Filled 2018-10-03: qty 1

## 2018-10-03 MED ORDER — SERTRALINE HCL 25 MG PO TABS
50.0000 mg | ORAL_TABLET | Freq: Every day | ORAL | Status: DC
  Administered 2018-10-03 – 2018-10-04 (×2): 50 mg via ORAL
  Filled 2018-10-03 (×2): qty 2

## 2018-10-03 MED ORDER — IPRATROPIUM-ALBUTEROL 0.5-2.5 (3) MG/3ML IN SOLN: 3.0000 mL | RESPIRATORY_TRACT | Status: DC | PRN

## 2018-10-03 MED ORDER — ADULT MULTIVITAMIN W/MINERALS CH: 1.0000 | ORAL_TABLET | Freq: Every day | ORAL | 0 refills | Status: DC

## 2018-10-03 MED ORDER — PHENOL 1.4 % MT LIQD
1.0000 | OROMUCOSAL | Status: DC | PRN
  Filled 2018-10-03: qty 177

## 2018-10-03 MED ORDER — ALUM & MAG HYDROXIDE-SIMETH 200-200-20 MG/5ML PO SUSP: 30.0000 mL | ORAL | Status: DC | PRN

## 2018-10-03 MED ORDER — SENNOSIDES-DOCUSATE SODIUM 8.6-50 MG PO TABS
1.0000 | ORAL_TABLET | Freq: Every evening | ORAL | Status: DC | PRN
  Filled 2018-10-03: qty 1

## 2018-10-03 MED ORDER — VITAMIN B-1 100 MG PO TABS
100.0000 mg | ORAL_TABLET | Freq: Every day | ORAL | Status: DC
  Administered 2018-10-04: 100 mg via ORAL
  Filled 2018-10-03: qty 1

## 2018-10-03 MED ORDER — PANTOPRAZOLE SODIUM 40 MG PO TBEC
40.0000 mg | DELAYED_RELEASE_TABLET | Freq: Every day | ORAL | Status: DC
  Administered 2018-10-04: 40 mg via ORAL
  Filled 2018-10-03: qty 1

## 2018-10-03 MED ORDER — TRAZODONE HCL 100 MG PO TABS
100.0000 mg | ORAL_TABLET | Freq: Every day | ORAL | Status: DC
  Administered 2018-10-03: 100 mg via ORAL
  Filled 2018-10-03: qty 1

## 2018-10-03 MED ORDER — ADULT MULTIVITAMIN W/MINERALS CH
1.0000 | ORAL_TABLET | Freq: Every day | ORAL | Status: DC
  Administered 2018-10-04: 1 via ORAL
  Filled 2018-10-03: qty 1

## 2018-10-03 MED ORDER — LAMOTRIGINE 25 MG PO TABS
25.0000 mg | ORAL_TABLET | Freq: Every day | ORAL | Status: DC
  Administered 2018-10-03 – 2018-10-04 (×2): 25 mg via ORAL
  Filled 2018-10-03 (×2): qty 1

## 2018-10-03 MED ORDER — ACETAMINOPHEN 325 MG PO TABS: 650.0000 mg | ORAL_TABLET | Freq: Four times a day (QID) | ORAL | Status: DC | PRN
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
## 2018-10-03 NOTE — Progress Notes (Signed)
Pharmacy Electrolyte Monitoring Consult:  Pharmacy consulted to assist in monitoring and replacing electrolytes in this 28 y.o. female admitted on 09/30/2018 with Drug Overdose   Labs:  Sodium (mmol/L)  Date Value  10/03/2018 138   Potassium (mmol/L)  Date Value  10/03/2018 3.9   Magnesium (mg/dL)  Date Value  56/21/3086 2.3   Phosphorus (mg/dL)  Date Value  57/84/6962 3.9   Calcium (mg/dL)  Date Value  95/28/4132 8.0 (L)   Albumin (g/dL)  Date Value  44/07/270 3.1 (L)    Assessment/Plan: No replacement warranted.   Will obtain electrolytes as clinically indicated.   Pharmacy will continue to monitor and adjust per consult.    Azani Brogdon L 10/03/2018 11:08 AM

## 2018-10-03 NOTE — Discharge Summary (Signed)
[facility] [place] [facility]   PATIENT NAME: Maria Espinoza    MR#:  [id number]  DATE OF BIRTH:  1991/06/20  DATE OF ADMISSION:  09/30/2018 ADMITTING PHYSICIAN: Barbaraann Rondo, MD  DATE OF DISCHARGE: 10/03/2018  PRIMARY CARE PHYSICIAN: Patient, No Pcp Per    ADMISSION DIAGNOSIS:  Suicide attempt (HCC) [T14.91XA] Intentional drug overdose, initial encounter (HCC) [T50.902A] Acute respiratory failure, unspecified whether with hypoxia or hypercapnia (HCC) [J96.00]  DISCHARGE DIAGNOSIS:  Intentional Drug overdose with Doxepin  SECONDARY DIAGNOSIS:   Past Medical History:  Diagnosis Date  . Bipolar depression [facility])     HOSPITAL COURSE:  Maria Espinoza a28 y.o.femalewith a known history of morbid obesity, bipolar D/O, p/w suicide attempt via intentional Doxepin overdose, episode of aspiration w/ acute respiratory failure necessitating intubation  1. Intentional overdose with Doxepin -patient has history of bipolar disorder -intubated and on the ventilator for airway protection--- now extubated and doing well. sats >92 on RA -continue symptomatic care -psychiatry consultation with Dr. O'Neal--recommends inpt psych admisison  2. Hypokalemia -received IV fluids and replaced potassium  3. History of bipolar disorder per psych  4. DVT prophylaxis subcu Lovenox  Pt ok from medical standpoint for d/c to behavioral medicine unit at [facility].  CONSULTS OBTAINED:  Treatment Team:  Barbaraann Rondo, MD Hessie Knows, MD  DRUG ALLERGIES:   Allergies  Allergen Reactions  . Latex Rash    DISCHARGE MEDICATIONS:   Allergies as of 10/03/2018      Reactions   Latex Rash      Medication List    TAKE these medications   Jencycla 0.35 MG tablet Generic drug:  norethindrone Take 1 tablet by mouth daily. as directed   levonorgestrel 20 MCG/24HR IUD Commonly known as:  MIRENA 1 each by Intrauterine route once.   multivitamin  with minerals Tabs tablet Take 1 tablet by mouth daily. Start taking on:  October 04, 2018   nicotine 21 mg/24hr patch Commonly known as:  NICODERM CQ - dosed in mg/24 hours Place 1 patch (21 mg total) onto the skin daily. Start taking on:  October 04, 2018       If you experience worsening of your admission symptoms, develop shortness of breath, life threatening emergency, suicidal or homicidal thoughts you must seek medical attention immediately by calling 911 or calling your MD immediately  if symptoms less severe.  You Must read complete instructions/literature along with all the possible adverse reactions/side effects for all the Medicines you take and that have been prescribed to you. Take any new Medicines after you have completely understood and accept all the possible adverse reactions/side effects.   Please note  You were cared for by a hospitalist during your hospital stay. If you have any questions about your discharge medications or the care you received while you were in the hospital after you are discharged, you can call the unit and asked to speak with the hospitalist on call if the hospitalist that took care of you is not available. Once you are discharged, your primary care physician will handle any further medical issues. Please note that NO REFILLS for any discharge medications will be authorized once you are discharged, as it is imperative that you return to your primary care physician (or establish a relationship with a primary care physician if you do not have one) for your aftercare needs so that they can reassess your need for medications and monitor your lab values. Today   SUBJECTIVE  Tearful, wants  to go home   VITAL SIGNS:  Blood pressure (!) 102/59, pulse 85, temperature 99 F (37.2 C), temperature source Oral, resp. rate (!) 26, height 5\' 6"  (1.676 m), weight 132.6 kg, SpO2 93 %.  I/O:    Intake/Output Summary (Last 24 hours) at 10/03/2018 0913 Last data  filed at 10/02/2018 1849 Gross per 24 hour  Intake 168 ml  Output 400 ml  Net -232 ml    PHYSICAL EXAMINATION:  GENERAL:  28 y.o.-year-old patient lying in the bed with no acute distress. Obese EYES: Pupils equal, round, reactive to light and accommodation. No scleral icterus. Extraocular muscles intact.  HEENT: Head atraumatic, normocephalic. Oropharynx and nasopharynx clear.  NECK:  Supple, no jugular venous distention. No thyroid enlargement, no tenderness.  LUNGS: Normal breath sounds bilaterally, no wheezing, rales,rhonchi or crepitation. No use of accessory muscles of respiration.  CARDIOVASCULAR: S1, S2 normal. No murmurs, rubs, or gallops.  ABDOMEN: Soft, non-tender, non-distended. Bowel sounds present. No organomegaly or mass.  EXTREMITIES: No pedal edema, cyanosis, or clubbing.  NEUROLOGIC: Cranial nerves II through XII are intact. Muscle strength 5/5 in all extremities. Sensation intact. Gait not checked.  PSYCHIATRIC: The patient is alert and oriented x 3.  SKIN: No obvious rash, lesion, or ulcer.   DATA REVIEW:   CBC  Recent Labs  Lab 09/30/18 2218  WBC 7.3  HGB 13.2  HCT 40.5  PLT 336    Chemistries  Recent Labs  Lab 10/01/18 0440  10/02/18 0450 10/03/18 0443  NA 139   < > 138 138  K 3.0*   < > 3.3* 3.9  CL 107   < > 104 108  CO2 25   < > 26 24  GLUCOSE 111*   < > 102* 90  BUN 9   < > 6 14  CREATININE 0.68   < > 0.91 0.83  CALCIUM 8.0*   < > 8.1* 8.0*  MG 1.7  --  2.3  --   AST 18  --   --   --   ALT 12  --   --   --   ALKPHOS 61  --   --   --   BILITOT 0.7  --   --   --    < > = values in this interval not displayed.    Microbiology Results   Recent Results (from the past 240 hour(s))  MRSA PCR Screening     Status: None   Collection Time: 10/01/18  3:01 AM  Result Value Ref Range Status   MRSA by PCR NEGATIVE NEGATIVE Final    Comment:        The GeneXpert MRSA Assay (FDA approved for NASAL specimens only), is one component of  a comprehensive MRSA colonization surveillance program. It is not intended to diagnose MRSA infection nor to guide or monitor treatment for MRSA infections. Performed at [facility], [address], [place], [place] [address]     RADIOLOGY:  No results found.   CODE STATUS:     Code Status Orders  (From admission, onward)         Start     Ordered   10/01/18 0226  Full code  Continuous     10/01/18 0225        Code Status History    This patient has a current code status but no historical code status.      TOTAL TIME TAKING CARE OF THIS PATIENT: *40* minutes.  Enedina Finner M.D on 10/03/2018 at 9:13 AM  Between 7am to 6pm - Pager - [phone] After 6pm go to [email or web address] - password [email or web address]  [facility]  Office  [phone]  CC: Primary care physician; Patient, No Pcp Per
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
## 2018-10-03 NOTE — BH Assessment (Signed)
Patient is to be admitted to [facility] by Dr. Flora Lipps.  Attending Physician will be Dr. Toni Amend.   Patient has been assigned to room 309, by [facility] Charge Nurse S[place].   Intake Paper Work has been signed and placed on patient chart.

## 2018-10-03 NOTE — Progress Notes (Signed)
CRITICAL CARE NOTE       SUBJECTIVE FINDINGS & SIGNIFICANT EVENTS   Doing well overnight, mental status improved. Plan for d/c today  PAST MEDICAL HISTORY   Past Medical History:  Diagnosis Date  . Bipolar depression (HCC)      SURGICAL HISTORY   Past Surgical History:  Procedure Laterality Date  . TONSILLECTOMY       FAMILY HISTORY   Family History  Problem Relation Age of Onset  . Migraines Father   . Heart disease Maternal Grandmother   . Depression Maternal Grandmother   . Hypertension Maternal Grandmother   . Breast cancer Paternal Grandfather      SOCIAL HISTORY   Social History   Tobacco Use  . Smoking status: Current Every Day Smoker    Packs/day: 0.50  . Smokeless tobacco: Never Used  Substance Use Topics  . Alcohol use: Yes    Comment: occasional  . Drug use: Never     MEDICATIONS   Current Medication:  Current Facility-Administered Medications:  .  acetaminophen (TYLENOL) tablet 650 mg, 650 mg, Oral, Q6H PRN, Vida Rigger, MD, 650 mg at 10/03/18 0842 .  bisacodyl (DULCOLAX) EC tablet 5 mg, 5 mg, Oral, Daily PRN, Marjie Skiff, Prasanna, MD .  Chlorhexidine Gluconate Cloth 2 % PADS 6 each, 6 each, Topical, Once, Dorothey Oetken, MD .  enoxaparin (LOVENOX) injection 40 mg, 40 mg, Subcutaneous, BID, Marjie Skiff, Prasanna, MD, 40 mg at 10/03/18 0843 .  folic acid (FOLVITE) tablet 1 mg, 1 mg, Oral, Daily, Fallen Crisostomo, MD, 1 mg at 10/03/18 0842 .  ipratropium-albuterol (DUONEB) 0.5-2.5 (3) MG/3ML nebulizer solution 3 mL, 3 mL, Nebulization, Q4H PRN, Karna Christmas, Lizet Kelso, MD .  LORazepam (ATIVAN) tablet 0.5 mg, 0.5 mg, Oral, Q8H PRN, Enedina Finner, MD .  multivitamin with minerals tablet 1 tablet, 1 tablet, Oral, Daily, Vida Rigger, MD, 1 tablet at 10/03/18 0842 .  nicotine  (NICODERM CQ - dosed in mg/24 hours) patch 21 mg, 21 mg, Transdermal, Daily, Enedina Finner, MD, 21 mg at 10/03/18 0909 .  pantoprazole (PROTONIX) EC tablet 40 mg, 40 mg, Oral, Daily, Bertram Savin, RPH, 40 mg at 10/03/18 1610 .  phenol (CHLORASEPTIC) mouth spray 1 spray, 1 spray, Mouth/Throat, PRN, Karna Christmas, Gianmarco Roye, MD .  senna-docusate (Senokot-S) tablet 1 tablet, 1 tablet, Oral, QHS PRN, Marjie Skiff, Prasanna, MD .  thiamine (VITAMIN B-1) tablet 100 mg, 100 mg, Oral, Daily, Jadwiga Faidley, MD, 100 mg at 10/03/18 0842    ALLERGIES   Latex    REVIEW OF SYSTEMS     10 system ROS conducted and is positive only for dizziness upon walking  PHYSICAL EXAMINATION   Vitals:   10/02/18 2000 10/03/18 0240  BP: 106/62 (!) 102/59  Pulse: 80 85  Resp: 17 (!) 26  Temp: 37 C 37.2 C  SpO2: 97% 93%    GENERAL: Very distraught HEAD: Normocephalic, atraumatic.  EYES: Pupils equal, round, reactive to light.  No scleral icterus.  MOUTH: Moist mucosal membrane. NECK: Supple. No thyromegaly. No nodules. No JVD.  PULMONARY: Clear to auscultation bilaterally with no wheezing rhonchi or crackles CARDIOVASCULAR: S1 and S2. Regular rate and rhythm. No murmurs, rubs, or gallops.  GASTROINTESTINAL: Soft, nontender, non-distended. No masses. Positive bowel sounds. No hepatosplenomegaly.  MUSCULOSKELETAL: No swelling, clubbing, or edema.  NEUROLOGIC: Mild distress due to acute illness SKIN:intact,warm,dry   LABS AND IMAGING     LAB RESULTS: Recent Labs  Lab 10/01/18 1542 10/02/18 0450 10/03/18 0443  NA 139 138 138  K 3.8 3.3* 3.9  CL 104 104 108  CO2 24 26 24   BUN 7 6 14   CREATININE 0.82 0.91 0.83  GLUCOSE 122* 102* 90   Recent Labs  Lab 09/30/18 2218  HGB 13.2  HCT 40.5  WBC 7.3  PLT 336     IMAGING RESULTS: No results found.    ASSESSMENT AND PLAN    --Multidisciplinary rounds held today  Suicide attempt -Optimizing for discharge plan for PT today   -Drug  overdose   -That is post extubation with altered mental status-improved  GI/Nutrition GI PROPHYLAXIS as indicated DIET-->TF's as tolerated Constipation protocol as indicated  ENDO - ICU hypoglycemic\Hyperglycemia protocol -check FSBS per protocol   ELECTROLYTES -follow labs as needed -replace as needed -pharmacy consultation   DVT/GI PRX ordered -SCDs  TRANSFUSIONS AS NEEDED MONITOR FSBS ASSESS the need for LABS as needed    Critical care provider statement:    Critical care time (minutes):  0   Critical care time was exclusive of:  Separately billable procedures and treating other patients   Critical care was necessary to treat or prevent imminent or life-threatening deterioration of the following conditions:  suicide attempt, altered mental status.    Critical care was time spent personally by me on the following activities:  Development of treatment plan with patient or surrogate, discussions with consultants, evaluation of patient's response to treatment, examination of patient, obtaining history from patient or surrogate, ordering and performing treatments and interventions, ordering and review of laboratory studies and re-evaluation of patient's condition.  I assumed direction of critical care for this patient from another provider in my specialty: no    This document was prepared using Dragon voice recognition software and may include unintentional dictation errors.    Vida Rigger, M.D.  Division of Pulmonary & Critical Care Medicine  [facility]

## 2018-10-03 NOTE — Consult Note (Addendum)
[facility] Face-to-Face Psychiatry Consult Follow Up  Reason for Consult:  Overdose on Doxepin Referring Physician:  Enedina Finner, MD Patient Identification: Maria Espinoza MRN:  [id number] Principal Diagnosis: <principal problem not specified> Diagnosis:  Active Problems:   Intentional doxepin overdose (HCC)   Total Time spent with patient, reviewing her chart, talking with pt's treatment team, and talking with pt's family: 40 min  Subjective:  "I miss my children."  Maria Espinoza is a 28 y.o. female patient admitted s/p suicide attempt/intentional overdose on doxepin.    HPI:   10/02/2018: Patient evaluated in ICU after presenting with overdose on "full bottle" of doxepin.  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  Pt states that her "highs" consist of increase goal directed activity, cleaning obsessively..."I get OCD".  Pt also states she also has decreased sleep.  She states over last few days, she couldn't rest. She reportedly went 2 days without sleep.  Pt states "I just wanted to sleep" so she took the overdose of doxepin.  However, it's unclear if this was truly the only reason pt took the overdose b/c when I initially asked why she would take the entire bottle, pt stated "I don't want to talk about it" inferring there was another trigger for her overdose.  Pt denies active SI. She denies HI, AH, VH, paranoia.  She identifies her step-parents, aunt and sisters as her support system.   Pt also reports recent low mood, but would not provide details.  She becomes tearful when attempting to discuss her symptoms further.   Pt states she gets panic attacks only a few times a year.  Denies benzodiazepine use.  Denies alcohol, marijuana, or other illicit substance use.  But UDS is positive for THC. Smokes 1 pack of cigarettes every 2 days.    Additionally, pt provides verbal consent to contact her "step parents" Kirt Boys and Windell Moulding  [phone] and her aunt Aggie Cosier [phone] to discuss plan of care and to get collateral.   [name] reports pt has bipolar, depression and anxiety.  Pt's biological mother died when pt was 26 years old and [name] doesn't believe pt ever got over it.  Also, pt reportedly was feeling left out from a family event recently; pt reportedly voiced depressive symptoms and told family that she would "be dead by the morning."  [name] is concerned for pt and is supportive of psychiatric care.  She states pt doesn't like taking psychiatric medication and will only take it if she feels anxious, so she's not taking consistently.  Pt's daughters-ages 10, 7 and 5 yo are at [employer] home.  Aunt states pt is a good mother, so she hopes pt gets better so she can return home to care for her children.     10/03/2018: Pt is feeling better today. She's able to ambulate without difficulty. Appetite is good.     Pt tearful, stating that she would like to return home to her children, but she understands that she needs help for her depression and bipolar symptoms.  Thus, she is willing to sign in the the behavioral health unit voluntarily.  However, pt was already involuntarily committed by previous physician.  Pt denies active SI, HI, AH, VH.   Pt requested I call her Maria Espinoza [phone], to discuss plan of care with her, but no answer.     Past Psychiatric History: [facility] in [place] and [place]--stopped going ~2 months ago.  Pt states she was diagnosed with "manic depression".  She takes doxepin for sleep and she believes she's prescribed lamictal for her mood. She denies history of psychiatric admissions.  She denies history of suicide attempts.  She denies history of self-injurious behaviors such as cutting.    Risk to Self:  yes Risk to Others:  pt denies Prior Inpatient Therapy:  pt denies Prior Outpatient Therapy:   [facility] in [place] and  [place]  Past Medical History:  Past Medical History:  Diagnosis Date  . Bipolar depression (HCC)     Past Surgical History:  Procedure Laterality Date  . TONSILLECTOMY     Family History:  Family History  Problem Relation Age of Onset  . Migraines Father   . Heart disease Maternal Grandmother   . Depression Maternal Grandmother   . Hypertension Maternal Grandmother   . Breast cancer Paternal Grandfather    Family Psychiatric  History: pt states she doesn't know her biological family's psychiatric history Social History:  Social History   Substance and Sexual Activity  Alcohol Use Yes   Comment: occasional     Social History   Substance and Sexual Activity  Drug Use Never    Social History   Socioeconomic History  . Marital status: Single    Spouse name: Not on file  . Number of children: Not on file  . Years of education: Not on file  . Highest education level: Not on file  Occupational History  . Not on file  Social Needs  . Financial resource strain: Not on file  . Food insecurity:    Worry: Not on file    Inability: Not on file  . Transportation needs:    Medical: Not on file    Non-medical: Not on file  Tobacco Use  . Smoking status: Current Every Day Smoker    Packs/day: 0.50  . Smokeless tobacco: Never Used  Substance and Sexual Activity  . Alcohol use: Yes    Comment: occasional  . Drug use: Never  . Sexual activity: Not on file  Lifestyle  . Physical activity:    Days per week: Not on file    Minutes per session: Not on file  . Stress: Not on file  Relationships  . Social connections:    Talks on phone: Not on file    Gets together: Not on file    Attends religious service: Not on file    Active member of club or organization: Not on file    Attends meetings of clubs or organizations: Not on file    Relationship status: Not on file  Other Topics Concern  . Not on file  Social History Narrative  . Not on file   Pt denies alcohol  and illicit drug use, but UDS positive for THC.  Additional Social History:  Pt reports she has three daughters, ages 105, 72 and 56 years old.  She has a boyfriend.  She was suppose to start new job at [employer] on 10/02/2018.  Pt states her biological mother is deceased and her biological father is incarcerated for "life".  She states she was raised by her "step-parents"-Shemika and Windell Moulding.      Allergies:   Allergies  Allergen Reactions  . Latex Rash    Labs:  Results for orders placed or performed during the hospital encounter of 09/30/18 (from the past 48 hour(s))  Basic metabolic panel     Status: Abnormal   Collection Time:  10/01/18  3:42 PM  Result Value Ref Range   Sodium 139 135 - 145 mmol/L   Potassium 3.8 3.5 - 5.1 mmol/L   Chloride 104 98 - 111 mmol/L   CO2 24 22 - 32 mmol/L   Glucose, Bld 122 (H) 70 - 99 mg/dL   BUN 7 6 - 20 mg/dL   Creatinine, Ser 4.09 0.44 - 1.00 mg/dL   Calcium 8.0 (L) 8.9 - 10.3 mg/dL   GFR calc non Af Amer >60 >60 mL/min   GFR calc Af Amer >60 >60 mL/min   Anion gap 11 5 - 15    Comment: Performed at [facility], [address] [place], [place] [address]  Basic metabolic panel     Status: Abnormal   Collection Time: 10/02/18  4:50 AM  Result Value Ref Range   Sodium 138 135 - 145 mmol/L   Potassium 3.3 (L) 3.5 - 5.1 mmol/L   Chloride 104 98 - 111 mmol/L   CO2 26 22 - 32 mmol/L   Glucose, Bld 102 (H) 70 - 99 mg/dL   BUN 6 6 - 20 mg/dL   Creatinine, Ser 4.78 0.44 - 1.00 mg/dL   Calcium 8.1 (L) 8.9 - 10.3 mg/dL   GFR calc non Af Amer >60 >60 mL/min   GFR calc Af Amer >60 >60 mL/min   Anion gap 8 5 - 15    Comment: Performed at [facility], [address] [place], [place] [address]  Magnesium     Status: None   Collection Time: 10/02/18  4:50 AM  Result Value Ref Range   Magnesium 2.3 1.7 - 2.4 mg/dL    Comment: Performed at [facility], [address], [place], [place] [address]   Phosphorus     Status: None   Collection Time: 10/02/18  4:50 AM  Result Value Ref Range   Phosphorus 3.9 2.5 - 4.6 mg/dL    Comment: Performed at [facility], [address] [place], [place] [address]  Basic metabolic panel     Status: Abnormal   Collection Time: 10/03/18  4:43 AM  Result Value Ref Range   Sodium 138 135 - 145 mmol/L   Potassium 3.9 3.5 - 5.1 mmol/L   Chloride 108 98 - 111 mmol/L   CO2 24 22 - 32 mmol/L   Glucose, Bld 90 70 - 99 mg/dL   BUN 14 6 - 20 mg/dL   Creatinine, Ser 9.62 0.44 - 1.00 mg/dL   Calcium 8.0 (L) 8.9 - 10.3 mg/dL   GFR calc non Af Amer >60 >60 mL/min   GFR calc Af Amer >60 >60 mL/min   Anion gap 6 5 - 15    Comment: Performed at [facility], [address], [place], [place] [address]    Current Facility-Administered Medications  Medication Dose Route Frequency Provider Last Rate Last Dose  . acetaminophen (TYLENOL) tablet 650 mg  650 mg Oral Q6H PRN Vida Rigger, MD   650 mg at 10/03/18 0842  . bisacodyl (DULCOLAX) EC tablet 5 mg  5 mg Oral Daily PRN Barbaraann Rondo, MD      . Chlorhexidine Gluconate Cloth 2 % PADS 6 each  6 each Topical Once Vida Rigger, MD      . enoxaparin (LOVENOX) injection 40 mg  40 mg Subcutaneous BID Barbaraann Rondo, MD   40 mg at 10/03/18 0843  . folic acid (FOLVITE) tablet 1 mg  1 mg Oral Daily Vida Rigger, MD  1 mg at 10/03/18 0842  . ipratropium-albuterol (DUONEB) 0.5-2.5 (3) MG/3ML nebulizer solution 3 mL  3 mL Nebulization Q4H PRN Vida RiggerAleskerov, Fuad, MD      . LORazepam (ATIVAN) tablet 0.5 mg  0.5 mg Oral Q8H PRN Enedina FinnerPatel, Sona, MD      . multivitamin with minerals tablet 1 tablet  1 tablet Oral Daily Vida RiggerAleskerov, Fuad, MD   1 tablet at 10/03/18 [phone]  . nicotine (NICODERM CQ - dosed in mg/24 hours) patch 21 mg  21 mg Transdermal Daily Enedina FinnerPatel, Sona, MD   21 mg at 10/03/18 0909  . pantoprazole (PROTONIX) EC tablet 40 mg  40 mg Oral Daily Bertram SavinSimpson, Michael L, RPH   40 mg at 10/03/18  [id number]  . phenol (CHLORASEPTIC) mouth spray 1 spray  1 spray Mouth/Throat PRN Vida RiggerAleskerov, Fuad, MD      . senna-docusate (Senokot-S) tablet 1 tablet  1 tablet Oral QHS PRN Barbaraann RondoSridharan, Prasanna, MD      . thiamine (VITAMIN B-1) tablet 100 mg  100 mg Oral Daily Vida RiggerAleskerov, Fuad, MD   100 mg at 10/03/18 [id number]    Musculoskeletal: Strength & Muscle Tone: within normal limits Gait & Station: normal Patient leans: N/A  Psychiatric Specialty Exam: Physical Exam  Nursing note and vitals reviewed. Constitutional: She is oriented to person, place, and time. She appears well-developed.      HENT:  Head: Normocephalic and atraumatic.  Cardiovascular: Normal rate.  Respiratory: Effort normal.  GI: Bowel sounds are normal.  Neurological: She is alert and oriented to person, place, and time.  Skin: Skin is warm and dry.    Review of Systems  Constitutional: Negative.           HENT: Negative.   Eyes: Negative.   Respiratory: Negative.   Cardiovascular: Negative.   Gastrointestinal: Negative.   Genitourinary: Negative.   Musculoskeletal: Negative.   Skin: Negative.   Neurological: Negative for headaches.  Psychiatric/Behavioral: Positive for depression. Negative for hallucinations and suicidal ideas. The patient is nervous/anxious.     Blood pressure (!) 104/56, pulse 81, temperature 98 F (36.7 C), temperature source Oral, resp. rate 20, height 5\' 6"  (1.676 m), weight 132.6 kg, SpO2 95 %.Body mass index is 47.18 kg/m.  General Appearance: Guarded and obese, disheveled   Eye Contact:  Poor  Speech:  clear, but low volume  Volume:  Decreased  Mood:  Dysphoric  Affect:  Depressed and Flat  Thought Process:  Linear  Orientation:  Other:  oriented to person, place, grossly to time  Thought Content:  Logical  Suicidal Thoughts:  pt denies  Homicidal Thoughts:  pt denies  Memory:  pt states she doesn't recall all the events surrounding her overdose.  But overall memory is grossly intact.    Judgement:  Fair  Insight:  Fair  Psychomotor Activity:  Decreased  Concentration:  Concentration: Fair and Attention Span: Fair  Recall:  FiservFair  Fund of Knowledge:  fair  Language:  Fair  Akathisia:  No  AIMS (if indicated):   N/A  Assets:  Desire for Improvement Social Support  ADL's:  Intact  Cognition: WNL  Sleep:   fair     Treatment Plan Summary: Intentional overdose on doxepin Suicide attempt Bipolar Disorder, unspecified, currently depressed Rule out Cannabis use disorder (UDS positive for THC)  Daily assessment to evaluate symptoms and progress in treatment, Medication management and Plan   28 yo female status post doxepin overdose.  Pt was intubated for airway protection.  Extubated on 10/01/2018.  Today pt is up talking and ambulating.  She's medically clear and ready for admission to behavioral health unit for further evaluation and treatment of her depression, bipolar disorder, and intentional overdose.   Pt admits she wasn't taking her prescribed psychotropic medication consistently.  I confirmed with the pharmacy staff that pt has not picked up her most recent prescription of lamictal which is waiting for her at [facility] in [place], [place] [phone].  Pt last filled doxepin 50 mg -1 tab qhs from same pharmacy on 02/08/2018 (prescriber Tomma Rakers, NP [phone]) and trazodone 50 mg tab-take 0.5 tab qhs last filled June 2019  Upon admission to psych unit, I will hold doxepin as she overdose on this.  Will defer other psych meds to psychiatric attending on unit.  Pt has not taken lamictal in quite some time, so will not order until she's able to discuss this with primary attending on the unit.    Continue IVC.   EKG ordered as previous EKG showed prolonged QTc of 500  Disposition: Recommend psychiatric Inpatient admission when medically cleared. Supportive therapy provided about ongoing stressors.  Pt is medically cleared and will be admitted to  behavioral health.  Admission orders completed and held.    Hessie Knows, MD 10/03/2018 10:37 AM

## 2018-10-03 NOTE — Plan of Care (Signed)
VSS on room air, pt discharged to [facility], report called to [facility], pt will transport with her sitter and a Engineer, materials.  Pt agreeable to go, but expresses desire to go home.

## 2018-10-03 NOTE — Tx Team (Signed)
Interdisciplinary Treatment and Diagnostic Plan Update  10/03/2018 Time of Session: 230PM Maria Espinoza MRN: [id number]  Principal Diagnosis: <principal problem not specified>  Secondary Diagnoses: Active Problems:   * No active hospital problems. *   Current Medications:  No current facility-administered medications for this encounter.    PTA Medications: Medications Prior to Admission  Medication Sig Dispense Refill Last Dose  . JENCYCLA 0.35 MG tablet Take 1 tablet by mouth daily. as directed  99 Not Taking at Unknown time  . levonorgestrel (MIRENA) 20 MCG/24HR IUD 1 each by Intrauterine route once.     Derrill Memo ON 10/04/2018] Multiple Vitamin (MULTIVITAMIN WITH MINERALS) TABS tablet Take 1 tablet by mouth daily. 30 tablet 0   . [START ON 10/04/2018] nicotine (NICODERM CQ - DOSED IN MG/24 HOURS) 21 mg/24hr patch Place 1 patch (21 mg total) onto the skin daily. 28 patch 0     Patient Stressors:    Patient Strengths:    Treatment Modalities: Medication Management, Group therapy, Case management,  1 to 1 session with clinician, Psychoeducation, Recreational therapy.   Physician Treatment Plan for Primary Diagnosis: <principal problem not specified> Long Term Goal(s):     Short Term Goals:    Medication Management: Evaluate patient's response, side effects, and tolerance of medication regimen.  Therapeutic Interventions: 1 to 1 sessions, Unit Group sessions and Medication administration.  Evaluation of Outcomes: Not Met  Physician Treatment Plan for Secondary Diagnosis: Active Problems:   * No active hospital problems. *  Long Term Goal(s):     Short Term Goals:       Medication Management: Evaluate patient's response, side effects, and tolerance of medication regimen.  Therapeutic Interventions: 1 to 1 sessions, Unit Group sessions and Medication administration.  Evaluation of Outcomes: Not Met   RN Treatment Plan for Primary Diagnosis: <principal problem not  specified> Long Term Goal(s): Knowledge of disease and therapeutic regimen to maintain health will improve  Short Term Goals: Ability to demonstrate self-control, Ability to participate in decision making will improve, Ability to verbalize feelings will improve and Ability to disclose and discuss suicidal ideas  Medication Management: RN will administer medications as ordered by provider, will assess and evaluate patient's response and provide education to patient for prescribed medication. RN will report any adverse and/or side effects to prescribing provider.  Therapeutic Interventions: 1 on 1 counseling sessions, Psychoeducation, Medication administration, Evaluate responses to treatment, Monitor vital signs and CBGs as ordered, Perform/monitor CIWA, COWS, AIMS and Fall Risk screenings as ordered, Perform wound care treatments as ordered.  Evaluation of Outcomes: Not Met   LCSW Treatment Plan for Primary Diagnosis: <principal problem not specified> Long Term Goal(s): Safe transition to appropriate next level of care at discharge, Engage patient in therapeutic group addressing interpersonal concerns.  Short Term Goals: Engage patient in aftercare planning with referrals and resources, Increase ability to appropriately verbalize feelings, Increase emotional regulation, Facilitate acceptance of mental health diagnosis and concerns and Increase skills for wellness and recovery  Therapeutic Interventions: Assess for all discharge needs, 1 to 1 time with Social worker, Explore available resources and support systems, Assess for adequacy in community support network, Educate family and significant other(s) on suicide prevention, Complete Psychosocial Assessment, Interpersonal group therapy.  Evaluation of Outcomes: Not Met   Progress in Treatment: Attending groups: No. Participating in groups: No. Taking medication as prescribed: Yes. Toleration medication: Yes. Family/Significant other  contact made: No, will contact:  collateral contact if pt is agreeable Patient understands diagnosis: Yes. Discussing  patient identified problems/goals with staff: Yes. Medical problems stabilized or resolved: Yes. Denies suicidal/homicidal ideation: Yes. Issues/concerns per patient self-inventory: No. Other: n/a  New problem(s) identified: No, Describe:  none  New Short Term/Long Term Goal(s): medication management for mood stabilization; elimination of SI thoughts; development of comprehensive mental wellness/sobriety plan.   Patient Goals:  "Get back on my medicine"  Discharge Plan or Barriers: SPE pamphlet, Mobile Crisis information, and AA/NA information provided to patient for additional community support and resources. Pt reports going to [facility] and is agreeable to returning. CSW assessing for appropriate referrals.  Reason for Continuation of Hospitalization: Depression Medication stabilization Suicidal ideation  Estimated Length of Stay: 5-7 days  Attendees: Patient:Maria Espinoza 10/03/2018 2:57 PM  Physician: Dr Weber Cooks MD 10/03/2018 2:57 PM  Nursing:  10/03/2018 2:57 PM  RN Care Manager: 10/03/2018 2:57 PM  Social Worker: Minette Brine [place] Beck LCSW 10/03/2018 2:57 PM  Recreational Therapist: Roanna Epley CTRS LRT 10/03/2018 2:57 PM  Other: Assunta Curtis LCSW 10/03/2018 2:57 PM  Other:  10/03/2018 2:57 PM  Other: 10/03/2018 2:57 PM    Scribe for Treatment Team: Mariann Laster Jakalyn Kratky, LCSW 10/03/2018 2:57 PM

## 2018-10-03 NOTE — H&P (Signed)
Psychiatric Admission Assessment Adult  Patient Identification: Maria Espinoza MRN:  [id number] Date of Evaluation:  10/03/2018 Chief Complaint:  depression Principal Diagnosis: Severe recurrent major depression without psychotic features ([place]) Diagnosis:  Principal Problem:   Severe recurrent major depression without psychotic features ([place]) Active Problems:   Bipolar disorder, unspecified ([place])   Depression with anxiety   Intentional doxepin overdose ([place])   Social anxiety disorder  History of Present Illness: Patient seen, chart reviewed.  Patient was transferred to [place] from the medical service after recovering from an overdose of doxepin.  Patient says "I have been under a lot of stress".  She indicates major stresses in her life including chronic feelings of isolation and alienation from her family, still mourning the death of her mother years ago, living with the knowledge that her father is doing a life sentence in prison and having to talk to him on the phone at times which makes her feel guilty and depressed.  She stopped taking her psychiatric medicines a couple months ago.  In fact on closer conversation she admits that she never really took them very consistently.  She denies any hallucinations.  She says that she had been having insomnia for a couple weeks feeling down and depressed very fatigued during the day a very negative.  She denies any psychotic symptoms.  Denies anything that really sounds like manic symptoms.  Had not been drinking or abusing drugs although she does smoke marijuana at times. Associated Signs/Symptoms: Depression Symptoms:  depressed mood, insomnia, psychomotor retardation, difficulty concentrating, recurrent thoughts of death, (Hypo) Manic Symptoms:  Impulsivity, Anxiety Symptoms:  Excessive Worry, Obsessive Compulsive Symptoms:   Checking,, Social Anxiety, Psychotic Symptoms:  None PTSD Symptoms: Had a traumatic exposure:  Patient has had many  stresses in her life although she does not endorse a specific traumatic event as the source of her problems Total Time spent with patient: 1 hour  Past Psychiatric History: Patient has never had inpatient hospitalization before but has been seeing outpatient providers for years.  Much of her care is through [place] [facility].  She says she has been on many medications although she cannot remember most of them.  When I mention Zoloft she believes that she has taken that in the past.  As noted above she admits that she really is never compliant clearly with her medicine.  Also has resisted therapy recently.  Patient had been diagnosed with bipolar disorder in the past.  I inquired over about manic symptoms and the most she could come up with was that sometimes she will clean her entire house all at once.  It is not clear to me that she has had symptoms that would really qualify as mania so much his anxiety and depression.  Denies any prior suicide attempts but admits that there was some suicidal ideation involved in this event.  Is the patient at risk to self? Yes.    Has the patient been a risk to self in the past 6 months? Yes.    Has the patient been a risk to self within the distant past? No.  Is the patient a risk to others? No.  Has the patient been a risk to others in the past 6 months? No.  Has the patient been a risk to others within the distant past? No.   Prior Inpatient Therapy:   Prior Outpatient Therapy:    Alcohol Screening: 1. How often do you have a drink containing alcohol?: Never 2. How many drinks  containing alcohol do you have on a typical day when you are drinking?: 1 or 2 3. How often do you have six or more drinks on one occasion?: Never AUDIT-C Score: 0 4. How often during the last year have you found that you were not able to stop drinking once you had started?: Never 5. How often during the last year have you failed to do what was normally expected from you becasue  of drinking?: Never 6. How often during the last year have you needed a first drink in the morning to get yourself going after a heavy drinking session?: Never 7. How often during the last year have you had a feeling of guilt of remorse after drinking?: Never 8. How often during the last year have you been unable to remember what happened the night before because you had been drinking?: Never 9. Have you or someone else been injured as a result of your drinking?: No 10. Has a relative or friend or a doctor or another health worker been concerned about your drinking or suggested you cut down?: No Alcohol Use Disorder Identification Test Final Score (AUDIT): 0 Alcohol Brief Interventions/Follow-up: AUDIT Score <7 follow-up not indicated, Continued Monitoring Substance Abuse History in the last 12 months:  No. Consequences of Substance Abuse: Negative Previous Psychotropic Medications: Yes  Psychological Evaluations: Yes  Past Medical History:  Past Medical History:  Diagnosis Date  . Bipolar depression ([place])     Past Surgical History:  Procedure Laterality Date  . TONSILLECTOMY     Family History:  Family History  Problem Relation Age of Onset  . Migraines Father   . Heart disease Maternal Grandmother   . Depression Maternal Grandmother   . Hypertension Maternal Grandmother   . Breast cancer Paternal Grandfather    Family Psychiatric  History: Patient states multiple people in her family with depression Tobacco Screening: Have you used any form of tobacco in the last 30 days? (Cigarettes, Smokeless Tobacco, Cigars, and/or Pipes): Yes Are you interested in Tobacco Cessation Medications?: Yes, will notify MD for an order Counseled patient on smoking cessation including recognizing danger situations, developing coping skills and basic information about quitting provided: Refused/Declined practical counseling Social History:  Social History   Substance and Sexual Activity  Alcohol  Use Yes   Comment: occasional     Social History   Substance and Sexual Activity  Drug Use Never    Additional Social History: Marital status: Single Does patient have children?: Yes How many children?: 3 How is patient's relationship with their children?: 10yo, 7yo, and 5yo ; pt reports a good relationship with her children                         Allergies:   Allergies  Allergen Reactions  . Latex Rash   Lab Results:  Results for orders placed or performed during the hospital encounter of 09/30/18 (from the past 48 hour(s))  Basic metabolic panel     Status: Abnormal   Collection Time: 10/02/18  4:50 AM  Result Value Ref Range   Sodium 138 135 - 145 mmol/L   Potassium 3.3 (L) 3.5 - 5.1 mmol/L   Chloride 104 98 - 111 mmol/L   CO2 26 22 - 32 mmol/L   Glucose, Bld 102 (H) 70 - 99 mg/dL   BUN 6 6 - 20 mg/dL   Creatinine, Ser 0.91 0.44 - 1.00 mg/dL   Calcium 8.1 (L) 8.9 - 10.3 mg/dL  GFR calc non Af Amer >60 >60 mL/min   GFR calc Af Amer >60 >60 mL/min   Anion gap 8 5 - 15    Comment: Performed at [facility], [address] [place], [place] [address]  Magnesium     Status: None   Collection Time: 10/02/18  4:50 AM  Result Value Ref Range   Magnesium 2.3 1.7 - 2.4 mg/dL    Comment: Performed at [facility], [address] [place], [place] [address]  Phosphorus     Status: None   Collection Time: 10/02/18  4:50 AM  Result Value Ref Range   Phosphorus 3.9 2.5 - 4.6 mg/dL    Comment: Performed at [facility], [address] [place], [place] [address]  Basic metabolic panel     Status: Abnormal   Collection Time: 10/03/18  4:43 AM  Result Value Ref Range   Sodium 138 135 - 145 mmol/L   Potassium 3.9 3.5 - 5.1 mmol/L   Chloride 108 98 - 111 mmol/L   CO2 24 22 - 32 mmol/L   Glucose, Bld 90 70 - 99 mg/dL   BUN 14 6 - 20 mg/dL   Creatinine, Ser 0.83 0.44 - 1.00 mg/dL   Calcium 8.0 (L) 8.9 - 10.3 mg/dL   GFR calc non Af Amer  >60 >60 mL/min   GFR calc Af Amer >60 >60 mL/min   Anion gap 6 5 - 15    Comment: Performed at [facility], [address], [place], [place] [address]    Blood Alcohol level:  Lab Results  Component Value Date   [facility] <10 24/03/7352    Metabolic Disorder Labs:  No results found for: HGBA1C, MPG No results found for: PROLACTIN No results found for: CHOL, TRIG, HDL, CHOLHDL, VLDL, LDLCALC  Current Medications: Current Facility-Administered Medications  Medication Dose Route Frequency Provider Last Rate Last Dose  . acetaminophen (TYLENOL) tablet 650 mg  650 mg Oral Q6H PRN Tennis Ship, MD      . alum & mag hydroxide-simeth (MAALOX/MYLANTA) 200-200-20 MG/5ML suspension 30 mL  30 mL Oral Q4H PRN Tennis Ship, MD      . Derrill Memo ON 2/99/2426] folic acid (FOLVITE) tablet 1 mg  1 mg Oral Daily O'Neal, Sarita, MD      . ipratropium-albuterol (DUONEB) 0.5-2.5 (3) MG/3ML nebulizer solution 3 mL  3 mL Nebulization Q4H PRN Tennis Ship, MD      . magnesium hydroxide (MILK OF MAGNESIA) suspension 30 mL  30 mL Oral Daily PRN Tennis Ship, MD      . Derrill Memo ON 10/04/2018] multivitamin with minerals tablet 1 tablet  1 tablet Oral Daily Tennis Ship, MD      . Derrill Memo ON 10/04/2018] nicotine (NICODERM CQ - dosed in mg/24 hours) patch 21 mg  21 mg Transdermal Daily Tennis Ship, MD      . Derrill Memo ON 10/04/2018] pantoprazole (PROTONIX) EC tablet 40 mg  40 mg Oral Daily O'Neal, Sarita, MD      . phenol (CHLORASEPTIC) mouth spray 1 spray  1 spray Mouth/Throat PRN Tennis Ship, MD      . senna-docusate (Senokot-S) tablet 1 tablet  1 tablet Oral QHS PRN Tennis Ship, MD      . Derrill Memo ON 10/04/2018] thiamine (VITAMIN B-1) tablet 100 mg  100 mg Oral Daily Tennis Ship, MD       PTA Medications: Medications Prior to Admission  Medication Sig Dispense Refill Last Dose  . JENCYCLA 0.35 MG tablet  Take 1 tablet by mouth daily. as directed  99 Not Taking at Unknown time  . levonorgestrel  (MIRENA) 20 MCG/24HR IUD 1 each by Intrauterine route once.     Derrill Memo ON 10/04/2018] Multiple Vitamin (MULTIVITAMIN WITH MINERALS) TABS tablet Take 1 tablet by mouth daily. 30 tablet 0   . [START ON 10/04/2018] nicotine (NICODERM CQ - DOSED IN MG/24 HOURS) 21 mg/24hr patch Place 1 patch (21 mg total) onto the skin daily. 28 patch 0     Musculoskeletal: Strength & Muscle Tone: within normal limits Gait & Station: normal Patient leans: N/A  Psychiatric Specialty Exam: Physical Exam  Nursing note and vitals reviewed. Constitutional: She appears well-developed and well-nourished.  HENT:  Head: Normocephalic and atraumatic.  Eyes: Pupils are equal, round, and reactive to light. Conjunctivae are normal.  Neck: Normal range of motion.  Cardiovascular: Regular rhythm and normal heart sounds.  Respiratory: Effort normal. No respiratory distress.  GI: Soft.  Musculoskeletal: Normal range of motion.  Neurological: She is alert.  Skin: Skin is warm and dry.  Psychiatric: Judgment normal. Her affect is blunt. Her speech is delayed. She is slowed. Thought content is not paranoid. Cognition and memory are normal. She exhibits a depressed mood. She expresses no homicidal and no suicidal ideation.    Review of Systems  Constitutional: Negative.   HENT: Negative.   Eyes: Negative.   Respiratory: Negative.   Cardiovascular: Negative.   Gastrointestinal: Negative.   Musculoskeletal: Negative.   Skin: Negative.   Neurological: Negative.   Psychiatric/Behavioral: Positive for depression. Negative for hallucinations, memory loss, substance abuse and suicidal ideas. The patient is nervous/anxious and has insomnia.     Blood pressure (!) 104/56, pulse 84, temperature 98 F (36.7 C), temperature source Oral, resp. rate 18, height _0  (1.6 m), weight 130.2 kg, SpO2 100 %.Body mass index is 50.84 kg/m.  General Appearance: Casual  Eye Contact:  Fair  Speech:  Clear and Coherent  Volume:  Normal   Mood:  Depressed  Affect:  Constricted  Thought Process:  Goal Directed  Orientation:  Full (Time, Place, and Person)  Thought Content:  Logical and Tangential  Suicidal Thoughts:  No  Homicidal Thoughts:  No  Memory:  Immediate;   Fair Recent;   Fair Remote;   Fair  Judgement:  Fair  Insight:  Fair  Psychomotor Activity:  Decreased  Concentration:  Concentration: Fair  Recall:  [employer] of Knowledge:  Fair  Language:  Fair  Akathisia:  No  Handed:  Right  AIMS (if indicated):     Assets:  Desire for Improvement Housing Physical Health Social Support  ADL's:  Intact  Cognition:  WNL  Sleep:       Treatment Plan Summary: Daily contact with patient to assess and evaluate symptoms and progress in treatment, Medication management and Plan This is a patient with chronic anxiety and depression who is living a chronically stressful life.  She has 3 young children who she raises by herself.  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  Feels alienated from her family.  Feels socially anxious.  Patient admits that there was suicidal ideation involved in the current overdose.  She is denying suicidal thoughts now but remains sad and tearful.  Based on my interview with her I think I will restart the lamotrigine that she had taken previously but at the same time start her on a regular antidepressant such as Zoloft and continue low-dose of  trazodone which she found more helpful for sleep and doxepin anyway.  Treatment team met with the patient today.  We will reassess and possibly consider discharge within the next few days but will make sure that she has good outpatient arrangements.  Observation Level/Precautions:  15 minute checks  Laboratory:  UDS  Psychotherapy:    Medications:    Consultations:    Discharge Concerns:    Estimated LOS:  Other:     Physician Treatment Plan for Primary Diagnosis: Severe recurrent major depression without psychotic features  ([place]) Long Term Goal(s): Improvement in symptoms so as ready for discharge  Short Term Goals: Ability to disclose and discuss suicidal ideas and Ability to demonstrate self-control will improve  Physician Treatment Plan for Secondary Diagnosis: Principal Problem:   Severe recurrent major depression without psychotic features ([place]) Active Problems:   Bipolar disorder, unspecified ([place])   Depression with anxiety   Intentional doxepin overdose ([place])   Social anxiety disorder  Long Term Goal(s): Improvement in symptoms so as ready for discharge  Short Term Goals: Ability to maintain clinical measurements within normal limits will improve and Compliance with prescribed medications will improve  I certify that inpatient services furnished can reasonably be expected to improve the patient's condition.    Alethia Berthold, MD 3/18/20204:28 PM

## 2018-10-03 NOTE — [facility] Suicide Risk Assessment (Signed)
[facility] INPATIENT:  Family/Significant Other Suicide Prevention Education  Suicide Prevention Education:  Education Completed; Maria Espinoza, aunt [phone] has been identified by the patient as the family member/significant other with whom the patient will be residing, and identified as the person(s) who will aid the patient in the event of a mental health crisis (suicidal ideations/suicide attempt).  With written consent from the patient, the family member/significant other has been provided the following suicide prevention education, prior to the and/or following the discharge of the patient.  The suicide prevention education provided includes the following:  Suicide risk factors  Suicide prevention and interventions  National Suicide Hotline telephone number  [facility] assessment telephone number  [facility] Emergency Assistance 911  [facility] and/or Residential Mobile Crisis Unit telephone number  Request made of family/significant other to:  Remove weapons (e.g., guns, rifles, knives), all items previously/currently identified as safety concern.    Remove drugs/medications (over-the-counter, prescriptions, illicit drugs), all items previously/currently identified as a safety concern.  The family member/significant other verbalizes understanding of the suicide prevention education information provided.  The family member/significant other agrees to remove the items of safety concern listed above.  Per pts aunt, pt felt left out from the family and felt like they were distancing themselves from her. Aunt denied any SI concerns or concerns with pt returning home at discharge. Aunt denied knowledge of any guns/weapons in the home and reported she will be the one to pick pt up when pt is discharged from the hospital. Aunt asked questions about visitation and was provided the phone hours and visitation hours for pts and informed that she will need the pts code to  visit pt.    Maria Espinoza MSW LCSW 10/03/2018, 3:34 PM

## 2018-10-03 NOTE — Plan of Care (Signed)
Pt understands [place] education and verbalizes.  Problem: Education: Goal: Knowledge of [place] [facility] Education information/materials will improve Outcome: Progressing
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
## 2018-10-03 NOTE — [facility] Group Notes (Signed)
Emotional Regulation 10/03/2018 1PM  Type of Therapy/Topic:  Group Therapy:  Emotion Regulation  Participation Level:  Did Not Attend   Description of Group:   The purpose of this group is to assist patients in learning to regulate negative emotions and experience positive emotions. Patients will be guided to discuss ways in which they have been vulnerable to their negative emotions. These vulnerabilities will be juxtaposed with experiences of positive emotions or situations, and patients will be challenged to use positive emotions to combat negative ones. Special emphasis will be placed on coping with negative emotions in conflict situations, and patients will process healthy conflict resolution skills.  Therapeutic Goals: 1. Patient will identify two positive emotions or experiences to reflect on in order to balance out negative emotions 2. Patient will label two or more emotions that they find the most difficult to experience 3. Patient will demonstrate positive conflict resolution skills through discussion and/or role plays  Summary of Patient Progress:       Therapeutic Modalities:   Cognitive Behavioral Therapy Feelings Identification Dialectical Behavioral Therapy   Suzan Slick, LCSW 10/03/2018 2:03 PM

## 2018-10-03 NOTE — Evaluation (Signed)
Physical Therapy Evaluation Patient Details Name: Maria Espinoza MRN: [id number] DOB: 04/07/1991 Today's Date: 10/03/2018   History of Present Illness  presented to ER secondary to intentional overdose; admitted for management of acute respiratory failure due to aspiration (after suicide attempt), requiring intubation 3/15-3/16  Clinical Impression  Upon evaluation, patient alert and oriented; follows commands and demonstrates good effort with mobility tasks.  Bilat UE/LE strength and ROM grossly symmetrical and WFL; no focal weakness, no pain reported.  Demonstrates ability to complete bed mobility indep; sit/stand, basic transfers and gait (100') without assist device, cga/close sup.  Reciprocal stepping pattern with increased sway bilat, but no overt buckling or LOB.  Does endorse mild dizziness with transition to upright and gait efforts; vitals stable and WFL throughout session (orthostatic negative; see flowsheets for details). Would benefit from skilled PT to address above deficits and promote optimal return to PLOF; .recommend transition to home with outpatient PT to address remaining balance, endurance deficits as appropriate.    Follow Up Recommendations Outpatient PT    Equipment Recommendations       Recommendations for Other Services       Precautions / Restrictions Precautions Precautions: Fall Restrictions Weight Bearing Restrictions: No      Mobility  Bed Mobility Overal bed mobility: Modified Independent                Transfers Overall transfer level: Needs assistance Equipment used: None   Sit to Stand: Min guard            Ambulation/Gait Ambulation/Gait assistance: Min guard Gait Distance (Feet): 100 Feet Assistive device: None       General Gait Details: reciprocal stepping pattern, increased sway bilat, but no overt buckling or LOB.  Mild reports of dizziness, but vitals stable and WFL  Stairs            Wheelchair Mobility     Modified Rankin (Stroke Patients Only)       Balance Overall balance assessment: Needs assistance Sitting-balance support: No upper extremity supported;Feet supported Sitting balance-Leahy Scale: Good     Standing balance support: Bilateral upper extremity supported Standing balance-Leahy Scale: Fair                               Pertinent Vitals/Pain Pain Assessment: No/denies pain    Home Living Family/patient expects to be discharged to:: Private residence Living Arrangements: Children(10, 7 and 5 year olds) Available Help at Discharge: Available PRN/intermittently;Family         Home Layout: One level Home Equipment: None      Prior Function Level of Independence: Independent         Comments: Indep without assist device for all functional activities; working at [employer]        Extremity/Trunk Assessment   Upper Extremity Assessment Upper Extremity Assessment: Overall WFL for tasks assessed    Lower Extremity Assessment Lower Extremity Assessment: Overall WFL for tasks assessed(grosly 4-/5 throughout)       Communication   Communication: No difficulties  Cognition Arousal/Alertness: Awake/alert Behavior During Therapy: WFL for tasks assessed/performed Overall Cognitive Status: Within Functional Limits for tasks assessed                                        General Comments  Exercises     Assessment/Plan    PT Assessment Patient needs continued PT services  PT Problem List Decreased strength;Decreased activity tolerance;Decreased balance;Decreased mobility       PT Treatment Interventions Stair training;Functional mobility training;Gait training;DME instruction;Therapeutic activities;Balance training;Therapeutic exercise;Patient/family education    PT Goals (Current goals can be found in the Care Plan section)  Acute Rehab PT Goals Patient Stated Goal: to get  better PT Goal Formulation: With patient Time For Goal Achievement: 10/17/18 Potential to Achieve Goals: Good    Frequency Min 2X/week   Barriers to discharge        Co-evaluation               AM-PAC PT "6 Clicks" Mobility  Outcome Measure Help needed turning from your back to your side while in a flat bed without using bedrails?: None Help needed moving from lying on your back to sitting on the side of a flat bed without using bedrails?: None Help needed moving to and from a bed to a chair (including a wheelchair)?: A Little Help needed standing up from a chair using your arms (e.g., wheelchair or bedside chair)?: A Little Help needed to walk in hospital room?: A Little Help needed climbing 3-5 steps with a railing? : A Little 6 Click Score: 20    End of Session Equipment Utilized During Treatment: Gait belt Activity Tolerance: Patient tolerated treatment well Patient left: in bed;with call bell/phone within reach;with nursing/sitter in room Nurse Communication: Mobility status PT Visit Diagnosis: Unsteadiness on feet (R26.81);Difficulty in walking, not elsewhere classified (R26.2)    Time: 8315-1761 PT Time Calculation (min) (ACUTE ONLY): 23 min   Charges:   PT Evaluation $PT Eval Moderate Complexity: 1 Mod PT Treatments $Therapeutic Activity: 8-22 mins       Mackenzie Groom H. Manson Passey, PT, DPT, NCS 10/03/18, 11:59 PM [phone]

## 2018-10-04 LAB — TSH: TSH: 3.614 u[IU]/mL (ref 0.350–4.500)

## 2018-10-04 LAB — LIPID PANEL
CHOLESTEROL: 134 mg/dL (ref 0–200)
HDL: 38 mg/dL — ABNORMAL LOW (ref >40)
LDL Cholesterol: 83 mg/dL (ref 0–99)
Total CHOL/HDL Ratio: 3.5 RATIO
Triglycerides: 63 mg/dL (ref <150)
VLDL: 13 mg/dL (ref 0–40)

## 2018-10-04 LAB — HEMOGLOBIN A1C
HEMOGLOBIN A1C: 5.3 % (ref 4.8–5.6)
Mean Plasma Glucose: 105.41 mg/dL

## 2018-10-04 MED ORDER — TRAZODONE HCL 100 MG PO TABS: 100.0000 mg | ORAL_TABLET | Freq: Every day | ORAL | 1 refills | Status: DC

## 2018-10-04 MED ORDER — SERTRALINE HCL 50 MG PO TABS: 50.0000 mg | ORAL_TABLET | Freq: Every day | ORAL | 1 refills | Status: DC

## 2018-10-04 MED ORDER — PANTOPRAZOLE SODIUM 40 MG PO TBEC: 40.0000 mg | DELAYED_RELEASE_TABLET | Freq: Every day | ORAL | 1 refills | Status: DC

## 2018-10-04 MED ORDER — LAMOTRIGINE 25 MG PO TABS: 25.0000 mg | ORAL_TABLET | Freq: Every day | ORAL | 1 refills | Status: DC

## 2018-10-04 NOTE — Plan of Care (Signed)
Pt is attending groups today. Pt filled out the pt self inventory paper. Pt expressing her goals of leaving and getting back to her life with outside care. Torrie Mayers RN

## 2018-10-04 NOTE — Progress Notes (Signed)
Recreation Therapy Notes  Date: 10/04/2018  Time: 9:30 am   Location: Craft room   Behavioral response: N/A   Intervention Topic: Happiness  Discussion/Intervention: Patient did not attend group.   Clinical Observations/Feedback:  Patient did not attend group.   Kamariyah Timberlake LRT/CTRS        Leelynn Whetsel 10/04/2018 10:23 AM 
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
## 2018-10-04 NOTE — Progress Notes (Signed)
D - Patient was in her room upon arrival to the unit. Patient was pleasant during assessment and medication administration. Patient denies SI/HI/AHV and pain. Patient rated her depression and anxiety 6/10 because she is in here and she wants to go home. Patient stated to this writer, "I feel better, I really want to go home tomorrow."   A - Patient compliant with medication administration per MD orders and procedures on the unit. Patient given education. Patient given support and encouragement to be active in her treatment plan. Patient informed to let staff know if there are any issues or problems on the unit.   R - Patient being monitored Q 15 minutes for safety per unit protocol. Patient remains safe on the unit.

## 2018-10-04 NOTE — Plan of Care (Signed)
Pt ready for discharge. Torrie Mayers RN
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
## 2018-10-04 NOTE — Progress Notes (Signed)
  [facility] Adult Case Management Discharge Plan :  Will you be returning to the same living situation after discharge:  Yes,  home At discharge, do you have transportation home?: Yes,  aunt Do you have the ability to pay for your medications: Yes,  medicaid  Release of information consent forms completed and in the chart;    Patient to Follow up at: Follow-up Information    [facility] up on 10/18/2018.   Why:  You have an appoitnment scheduled for Thursday 10/18/18 at 3:20 with Theresa Mulligan. If you need to cancel or reschedule, you must do so within 24 hours of your scheduled appointment. Thank you! Contact information: [address] [place] [address] [phone]           Next level of care provider has access to [facility] Link:no  Safety Planning and Suicide Prevention discussed: Yes,  SPE completed with pt and pts aunt  Have you used any form of tobacco in the last 30 days? (Cigarettes, Smokeless Tobacco, Cigars, and/or Pipes): Yes  Has patient been referred to the Quitline?: Patient refused referral  Patient has been referred for addiction treatment: N/A  Mechele Dawley, LCSW 10/04/2018, 10:58 AM

## 2018-10-04 NOTE — Progress Notes (Signed)
Pt denies suicidal ideations, depression, sadness and  hallucinations. Pt has hopes to leave today so she can "pay her insurance and sign up for school." I told the patient that it seemed that she was feeling better and her facial expressions are more expressive and show signs of hope. She said, "I do feel better today." Maria Mayers Rn Signed and Held Orders    None

## 2018-10-04 NOTE — Progress Notes (Signed)
Pt received discharge packet and educated. Pt was discharged with no suicidal ideations. The patient's sister picked her up to go to her home. Torrie Mayers RN

## 2018-10-04 NOTE — Plan of Care (Signed)
Patient compliant with medication administration per MD orders and procedures on the unit.   Problem: Health Behavior/Discharge Planning: Goal: Compliance with therapeutic regimen will improve 10/04/2018 0022 by Elmyra Ricks, RN Outcome: Progressing   Problem: Medication: Goal: Compliance with prescribed medication regimen will improve 10/04/2018 0022 by Elmyra Ricks, RN Outcome: Progressing

## 2019-01-28 ENCOUNTER — Emergency Department
Admission: EM | Admit: 2019-01-28 | Discharge: 2019-01-28 | Disposition: A | Discharge status: Home / Self Care | Payer: Medicaid Other | Attending: Emergency Medicine | Admitting: Emergency Medicine

## 2019-01-28 ENCOUNTER — Other Ambulatory Visit: Payer: Self-pay

## 2019-01-28 ENCOUNTER — Encounter: Payer: Self-pay | Admitting: Emergency Medicine

## 2019-01-28 DIAGNOSIS — T43225A Adverse effect of selective serotonin reuptake inhibitors, initial encounter: Secondary | ICD-10-CM | POA: Insufficient documentation

## 2019-01-28 DIAGNOSIS — Y69 Unspecified misadventure during surgical and medical care: Secondary | ICD-10-CM | POA: Diagnosis not present

## 2019-01-28 DIAGNOSIS — Z79899 Other long term (current) drug therapy: Secondary | ICD-10-CM | POA: Diagnosis not present

## 2019-01-28 DIAGNOSIS — Z3202 Encounter for pregnancy test, result negative: Secondary | ICD-10-CM | POA: Diagnosis not present

## 2019-01-28 DIAGNOSIS — F17200 Nicotine dependence, unspecified, uncomplicated: Secondary | ICD-10-CM | POA: Insufficient documentation

## 2019-01-28 DIAGNOSIS — T887XXA Unspecified adverse effect of drug or medicament, initial encounter: Secondary | ICD-10-CM | POA: Insufficient documentation

## 2019-01-28 DIAGNOSIS — R140 Abdominal distension (gaseous): Secondary | ICD-10-CM | POA: Diagnosis present

## 2019-01-28 LAB — HCG, QUANTITATIVE, PREGNANCY: hCG, Beta Chain, Quant, S: <1 m[IU]/mL (ref <5)

## 2019-01-28 LAB — POCT PREGNANCY, URINE: Preg Test, Ur: NEGATIVE

## 2019-01-28 NOTE — ED Triage Notes (Signed)
PT states she thinks she is preg, states neg preg test at home x38months but " I'm getting bigger". PT states her last period was last month for 2days. PT in NAD.

## 2019-01-28 NOTE — ED Provider Notes (Signed)
[facility] Emergency Department Provider Note  ____________________________________________  Time seen: Approximately 5:11 PM  I have reviewed the triage vital signs and the nursing notes.   HISTORY  Chief Complaint No chief complaint on file.    HPI Maria Espinoza is a 28 y.o. female who presents the emergency department concern for possible pregnancy.  Patient reports that over the past 3 months she has had menstrual cycle changes, feels like her abdomen is distending, is having some swelling in her feet.  Patient reports that prior to the onset of symptoms she was started on several different medications including Lamictal, Zoloft, trazodone.  Patient is taken home pregnancy test which were negative.  Patient presents here concerned she may be pregnant.  Last menstrual period occurred when it was supposed to, however it was much shorter than normal.  No other complaints at this time.         Past Medical History:  Diagnosis Date  . Bipolar depression [facility])     Patient Active Problem List   Diagnosis Date Noted  . Social anxiety disorder 10/03/2018  . Severe recurrent major depression without psychotic features ([place]) 10/03/2018  . Intentional doxepin overdose ([place]) 10/01/2018  . Bipolar disorder, unspecified ([place]) 07/24/2018  . Depression with anxiety 04/25/2018  . Migraines 04/25/2018  . Morbid obesity ([place]) 04/25/2018    Past Surgical History:  Procedure Laterality Date  . TONSILLECTOMY      Prior to Admission medications   Medication Sig Start Date End Date Taking? Authorizing Provider  lamoTRIgine (LAMICTAL) 25 MG tablet Take 1 tablet (25 mg total) by mouth daily. 10/05/18   Clapacs, Madie Reno, MD  pantoprazole (PROTONIX) 40 MG tablet Take 1 tablet (40 mg total) by mouth daily. 10/05/18   Clapacs, Madie Reno, MD  sertraline (ZOLOFT) 50 MG tablet Take 1 tablet (50 mg total) by mouth daily. 10/05/18   Clapacs, Madie Reno, MD  traZODone (DESYREL) 100 MG  tablet Take 1 tablet (100 mg total) by mouth at bedtime. 10/04/18   Clapacs, Madie Reno, MD    Allergies Latex  Family History  Problem Relation Age of Onset  . Migraines Father   . Heart disease Maternal Grandmother   . Depression Maternal Grandmother   . Hypertension Maternal Grandmother   . Breast cancer Paternal Grandfather     Social History Social History   Tobacco Use  . Smoking status: Current Every Day Smoker    Packs/day: 0.50  . Smokeless tobacco: Never Used  Substance Use Topics  . Alcohol use: Yes    Comment: occasional  . Drug use: Never     Review of Systems  Constitutional: No fever/chills Eyes: No visual changes. No discharge ENT: No upper respiratory complaints. Cardiovascular: no chest pain. Respiratory: no cough. No SOB. Gastrointestinal: No abdominal pain.  No nausea, no vomiting.  No diarrhea.  No constipation. Genitourinary: Negative for dysuria. No hematuria.  Changes in menstrual cycle, shortening menstrual cycles are normal. Musculoskeletal: Negative for musculoskeletal pain. Skin: Negative for rash, abrasions, lacerations, ecchymosis. Neurological: Negative for headaches, focal weakness or numbness. Psychological: No suicidal homicidal ideations.  Patient taking her medications as prescribed. 10-point ROS otherwise negative.  ____________________________________________   PHYSICAL EXAM:  VITAL SIGNS: ED Triage Vitals  Enc Vitals Group     BP 01/28/19 1558 130/80     Pulse Rate 01/28/19 1558 65     Resp 01/28/19 1558 18     Temp 01/28/19 1558 98.4 F (36.9 C)  Temp Source 01/28/19 1558 Oral     SpO2 01/28/19 1558 98 %     Weight 01/28/19 1559 280 lb (127 kg)     Height 01/28/19 1559 5\' 3"  (1.6 m)     Head Circumference --      Peak Flow --      Pain Score 01/28/19 1604 5     Pain Loc --      Pain Edu? --      Excl. in GC? --      Constitutional: Alert and oriented. Well appearing and in no acute distress. Eyes: Conjunctivae  are normal. PERRL. EOMI. Head: Atraumatic. ENT:      Ears:       Nose: No congestion/rhinnorhea.      Mouth/Throat: Mucous membranes are moist.  Neck: No stridor.    Cardiovascular: Normal rate, regular rhythm. Normal S1 and S2.  Good peripheral circulation. Respiratory: Normal respiratory effort without tachypnea or retractions. Lungs CTAB. Good air entry to the bases with no decreased or absent breath sounds. Gastrointestinal: Identified truncal obesity.  Bowel sounds 4 quadrants. Soft and nontender to palpation. No guarding or rigidity. No palpable masses. No distention. No CVA tenderness. Musculoskeletal: Full range of motion to all extremities. No gross deformities appreciated. Neurologic:  Normal speech and language. No gross focal neurologic deficits are appreciated.  Skin:  Skin is warm, dry and intact. No rash noted. Psychiatric: Mood and affect are normal. Speech and behavior are normal. Patient exhibits appropriate insight and judgement.   ____________________________________________   LABS (all labs ordered are listed, but only abnormal results are displayed)  Labs Reviewed  HCG, QUANTITATIVE, PREGNANCY  POCT PREGNANCY, URINE  POC URINE PREG, ED   ____________________________________________  EKG   ____________________________________________  RADIOLOGY   No results found.  ____________________________________________    PROCEDURES  Procedure(s) performed:    Procedures    Medications - No data to display   ____________________________________________   INITIAL IMPRESSION / ASSESSMENT AND PLAN / ED COURSE  Pertinent labs & imaging results that were available during my care of the patient were reviewed by me and considered in my medical decision making (see chart for details).  Review of the [place] CSRS was performed in accordance of the [facility] prior to dispensing any controlled drugs.           Patient's diagnosis is consistent with  medication side effect, encounter for pregnancy test.  Patient presented to the emergency department concerned that she may be pregnant.  Patient is started on multiple antipsychotics prior to onset of symptoms.  Given patient's reported symptoms, I do suspect that this was a side effect of medication.  Patient had both a negative urine and blood pregnancy test in the emergency department.  Patient has a scheduled appointment with her psychiatrist in the morning.  Discussed side effects with psychiatry to determine whether medication change is warranted.  No changes in medication at this time, no new prescriptions.  Patient is given ED precautions to return to the ED for any worsening or new symptoms.     ____________________________________________  FINAL CLINICAL IMPRESSION(S) / ED DIAGNOSES  Final diagnoses:  Medication side effect  Encounter for pregnancy test with result negative      NEW MEDICATIONS STARTED DURING THIS VISIT:  ED Discharge Orders    None          This chart was dictated using voice recognition software/Dragon. Despite best efforts to proofread, errors can occur which can change the meaning.  Any change was purely unintentional.    Victoriya Pol D, Racheal Patches-C 01/28/19 1823    Phineas SemenGoodman, [place], MD 01/28/19 [id number]

## 2019-01-28 NOTE — ED Notes (Signed)
See triage note  Presents with abd swelling and feels like she is pregnant    States last period was the end of last month

## 2019-04-10 ENCOUNTER — Emergency Department
Admission: EM | Admit: 2019-04-10 | Discharge: 2019-04-10 | Disposition: A | Discharge status: Home / Self Care | Payer: Medicaid Other | Attending: Emergency Medicine | Admitting: Emergency Medicine

## 2019-04-10 ENCOUNTER — Other Ambulatory Visit: Payer: Self-pay

## 2019-04-10 ENCOUNTER — Encounter: Payer: Self-pay | Admitting: Emergency Medicine

## 2019-04-10 ENCOUNTER — Emergency Department: Payer: Medicaid Other

## 2019-04-10 DIAGNOSIS — F17210 Nicotine dependence, cigarettes, uncomplicated: Secondary | ICD-10-CM | POA: Diagnosis not present

## 2019-04-10 DIAGNOSIS — Z79899 Other long term (current) drug therapy: Secondary | ICD-10-CM | POA: Insufficient documentation

## 2019-04-10 DIAGNOSIS — M79671 Pain in right foot: Secondary | ICD-10-CM | POA: Diagnosis present

## 2019-04-10 DIAGNOSIS — Z91040 Latex allergy status: Secondary | ICD-10-CM | POA: Diagnosis not present

## 2019-04-10 MED ORDER — INDOMETHACIN 50 MG PO CAPS
50.0000 mg | ORAL_CAPSULE | Freq: Once | ORAL | Status: AC
  Administered 2019-04-10: 05:00:00 50 mg via ORAL
  Filled 2019-04-10: qty 1

## 2019-04-10 MED ORDER — TRAMADOL HCL 50 MG PO TABS
50.0000 mg | ORAL_TABLET | Freq: Once | ORAL | Status: AC
  Administered 2019-04-10: 50 mg via ORAL
  Filled 2019-04-10: qty 1

## 2019-04-10 MED ORDER — TRAMADOL HCL 50 MG PO TABS: 50.0000 mg | ORAL_TABLET | Freq: Four times a day (QID) | ORAL | 0 refills | Status: AC | PRN

## 2019-04-10 NOTE — ED Triage Notes (Signed)
Patient to ER for c/o right sided foot pain x2 weeks, worsened this week. Patient denies any known injury. Patient reports pain is worse at top of foot.

## 2019-04-10 NOTE — ED Provider Notes (Signed)
[facility] Emergency Department Provider Note   First MD Initiated Contact with Patient 04/10/19 [phone]     (approximate)  I have reviewed the triage vital signs and the nursing notes.   HISTORY  Chief Complaint Foot Pain   HPI Maria Espinoza is a 28 y.o. female with below list of previous medical conditions presents to the emergency department secondary to nontraumatic right foot pain x2 weeks has progressively worsened since onset.  Patient states that current pain score is 9 out of 10.  Patient states that pain is worse with ambulation and movement of the foot.        Past Medical History:  Diagnosis Date   Bipolar depression [facility])     Patient Active Problem List   Diagnosis Date Noted   Social anxiety disorder 10/03/2018   Severe recurrent major depression without psychotic features (HCC) 10/03/2018   Intentional doxepin overdose (HCC) 10/01/2018   Bipolar disorder, unspecified (HCC) 07/24/2018   Depression with anxiety 04/25/2018   Migraines 04/25/2018   Morbid obesity (HCC) 04/25/2018    Past Surgical History:  Procedure Laterality Date   TONSILLECTOMY      Prior to Admission medications   Medication Sig Start Date End Date Taking? Authorizing Provider  lamoTRIgine (LAMICTAL) 25 MG tablet Take 1 tablet (25 mg total) by mouth daily. 10/05/18   Clapacs, Jackquline Denmark, MD  pantoprazole (PROTONIX) 40 MG tablet Take 1 tablet (40 mg total) by mouth daily. 10/05/18   Clapacs, Jackquline Denmark, MD  sertraline (ZOLOFT) 50 MG tablet Take 1 tablet (50 mg total) by mouth daily. 10/05/18   Clapacs, Jackquline Denmark, MD  traZODone (DESYREL) 100 MG tablet Take 1 tablet (100 mg total) by mouth at bedtime. 10/04/18   Clapacs, Jackquline Denmark, MD    Allergies Latex  Family History  Problem Relation Age of Onset   Migraines Father    Heart disease Maternal Grandmother    Depression Maternal Grandmother    Hypertension Maternal Grandmother    Breast cancer Paternal  Grandfather     Social History Social History   Tobacco Use   Smoking status: Current Every Day Smoker    Packs/day: 0.50   Smokeless tobacco: Never Used  Substance Use Topics   Alcohol use: Yes    Comment: occasional   Drug use: Never    Review of Systems Constitutional: No fever/chills Eyes: No visual changes. ENT: No sore throat. Cardiovascular: Denies chest pain. Respiratory: Denies shortness of breath. Gastrointestinal: No abdominal pain.  No nausea, no vomiting.  No diarrhea.  No constipation. Genitourinary: Negative for dysuria. Musculoskeletal: Negative for neck pain.  Negative for back pain.  Positive for right foot pain Integumentary: Negative for rash. Neurological: Negative for headaches, focal weakness or numbness.   ____________________________________________   PHYSICAL EXAM:  VITAL SIGNS: ED Triage Vitals [04/10/19 0341]  Enc Vitals Group     BP (!) 145/98     Pulse Rate 74     Resp 20     Temp 97.8 F (36.6 C)     Temp Source Oral     SpO2 97 %     Weight 127 kg (280 lb)     Height 1.6 m (5\' 3" )     Head Circumference      Peak Flow      Pain Score 9     Pain Loc      Pain Edu?      Excl. in GC?     Constitutional:  Alert and oriented.  Eyes: Conjunctivae are normal.  Mouth/Throat: Mucous membranes are moist. Neck: No stridor.  No meningeal signs.   Cardiovascular: Normal rate, regular rhythm. Good peripheral circulation. Grossly normal heart sounds. Musculoskeletal: Pain to palpation dorsal aspect of the right foot predominantly at the base of the great toe extending to the first metatarsal Neurologic:  Normal speech and language. No gross focal neurologic deficits are appreciated.  Skin:  Skin is warm, dry and intact. Psychiatric: Mood and affect are normal. Speech and behavior are normal.  ____________________________________________   LABS (all labs ordered are listed, but only abnormal results are displayed)  Labs Reviewed  - No data to display _______________________________________ Procedures   ____________________________________________   INITIAL IMPRESSION / MDM / [place] / ED COURSE  As part of my medical decision making, I reviewed the following data within the electronic MEDICAL RECORD NUMBER   28 year old female presenting with above-stated history and physical exam secondary to nontraumatic right foot pain.  Considered a possibility of stress fracture versus gout.  Right foot x-ray revealed: No evidence of fracture.  Also considered possibly of gout.  Indomethacin was given the emergency department without any improvement.  ____________________________________________  FINAL CLINICAL IMPRESSION(S) / ED DIAGNOSES  Final diagnoses:  Foot pain, right     MEDICATIONS GIVEN DURING THIS VISIT:  Medications  indomethacin (INDOCIN) capsule 50 mg (50 mg Oral Given 04/10/19 9381)     ED Discharge Orders    None      *Please note:  Maria Espinoza was evaluated in Emergency Department on 04/10/2019 for the symptoms described in the history of present illness. She was evaluated in the context of the global COVID-19 pandemic, which necessitated consideration that the patient might be at risk for infection with the SARS-CoV-2 virus that causes COVID-19. Institutional protocols and algorithms that pertain to the evaluation of patients at risk for COVID-19 are in a state of rapid change based on information released by regulatory bodies including the CDC and federal and state organizations. These policies and algorithms were followed during the patient's care in the ED.  Some ED evaluations and interventions may be delayed as a result of limited staffing during the pandemic.*  Note:  This document was prepared using Dragon voice recognition software and may include unintentional dictation errors.   Gregor Hams, MD 04/10/19 [phone]

## 2019-04-29 IMAGING — US US OB COMP LESS 14 WK
1 series · 14 of 28 positions shown · non-contrast
Comparison: None.

CLINICAL DATA: Crampy abdominal pain. Estimated gestational age of
4 weeks, 3 days by LMP.

EXAM:
OBSTETRIC <14 WK US AND TRANSVAGINAL OB US
TECHNIQUE: Both transabdominal and transvaginal ultrasound examinations were
performed for complete evaluation of the gestation as well as the
maternal uterus, adnexal regions, and pelvic cul-de-sac.
Transvaginal technique was performed to assess early pregnancy.

[Series 1: us ob comp less 14 wk · 0.23mm/px · 14 of 96 slices shown]
[im 4/96]
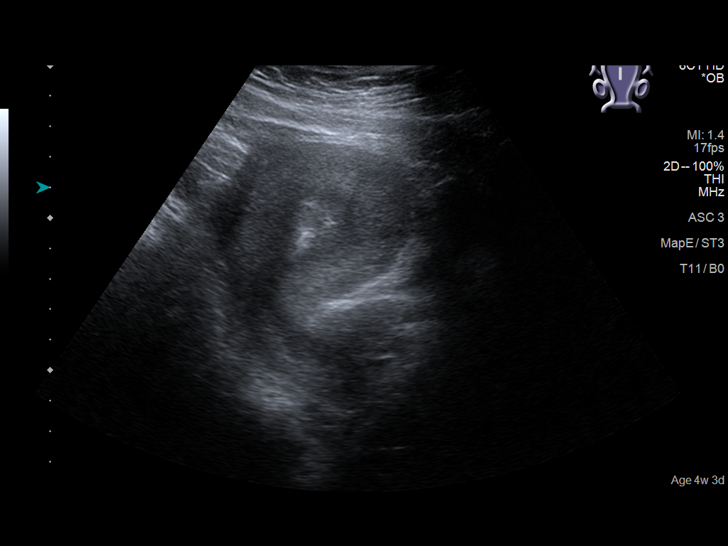
[im 11/96]
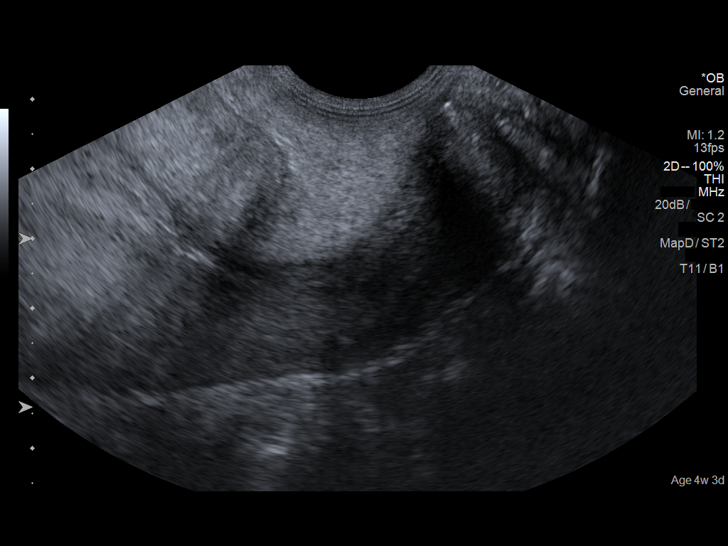
[im 18/96]
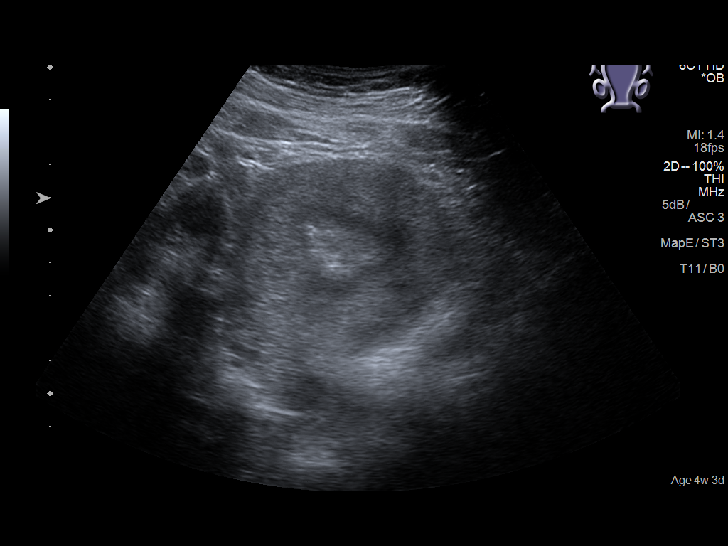
[im 25/96]
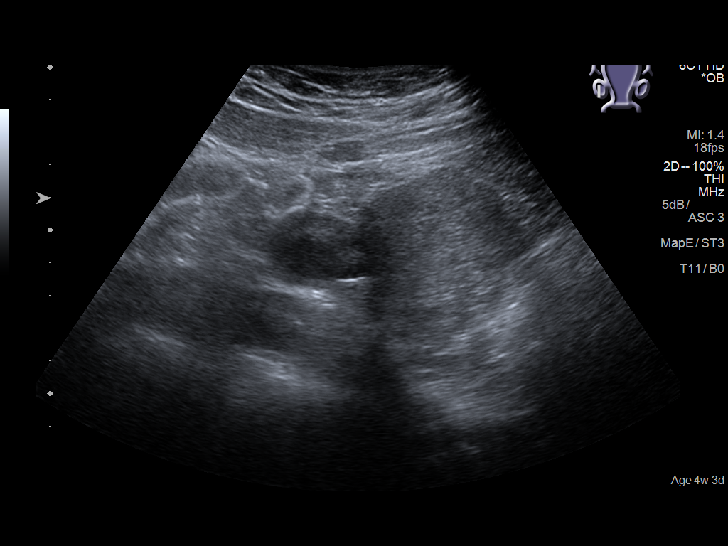
[im 32/96]
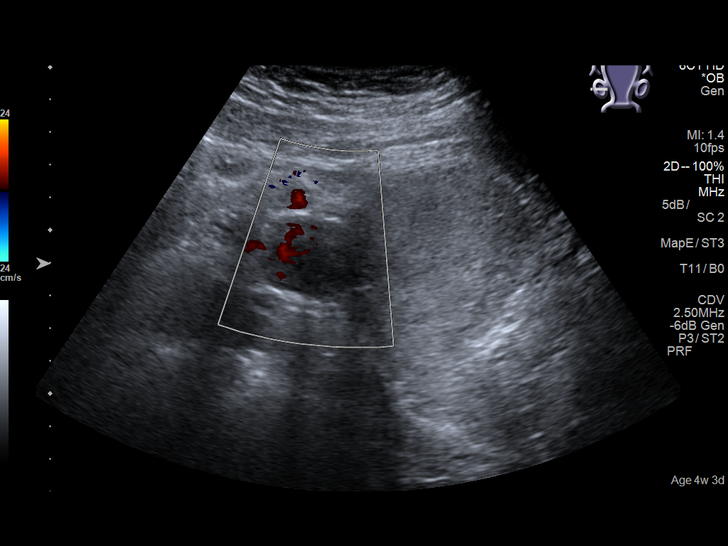
[im 39/96]
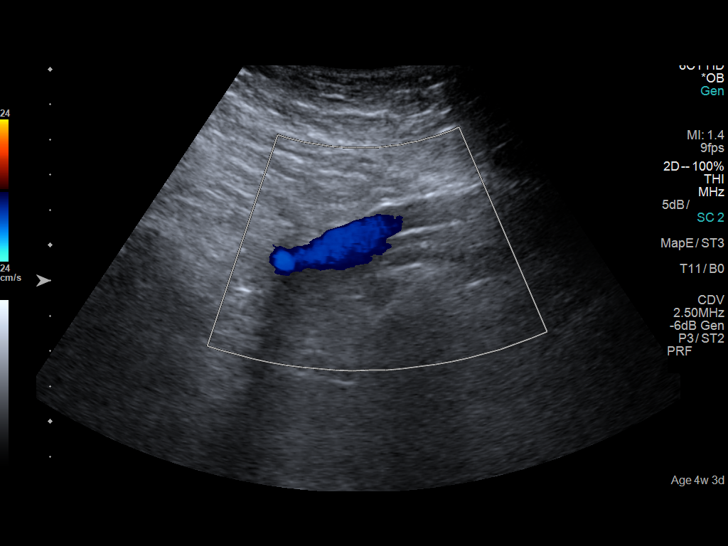
[im 46/96]
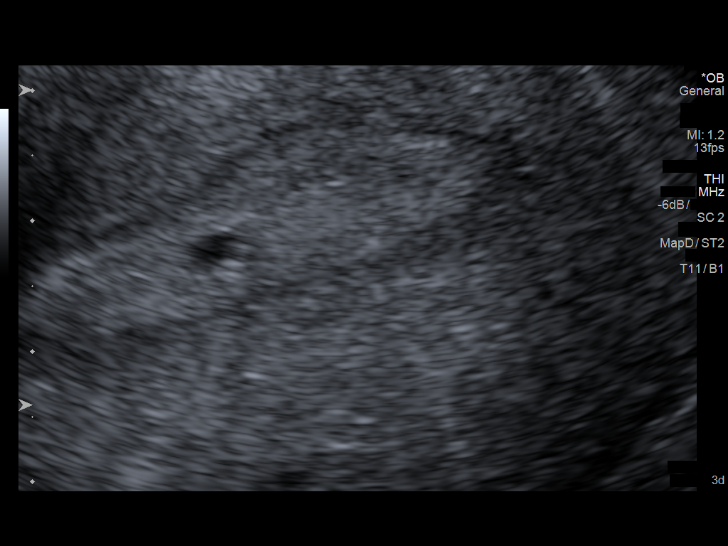
[im 53/96]
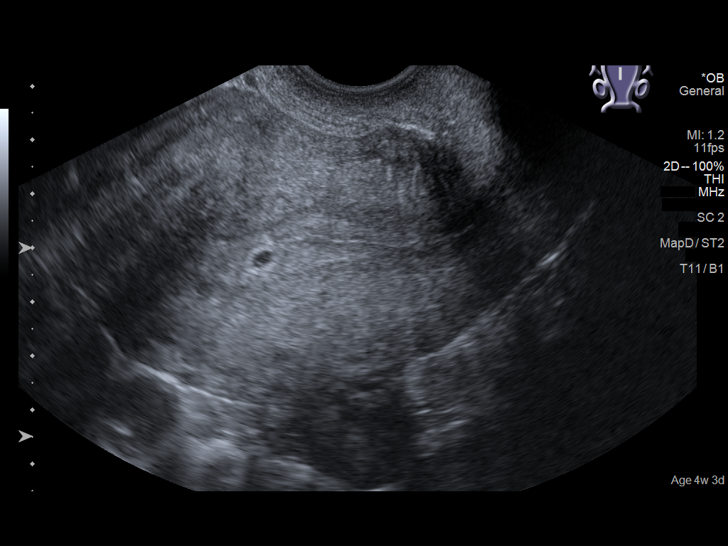
[im 60/96]
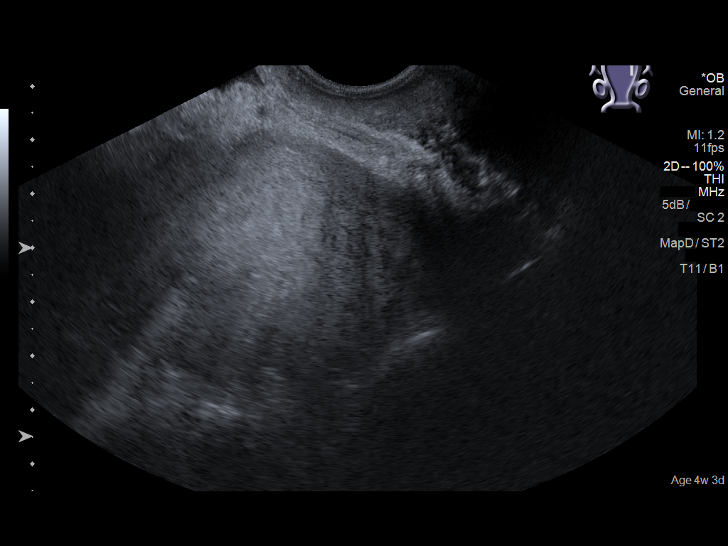
[im 67/96]
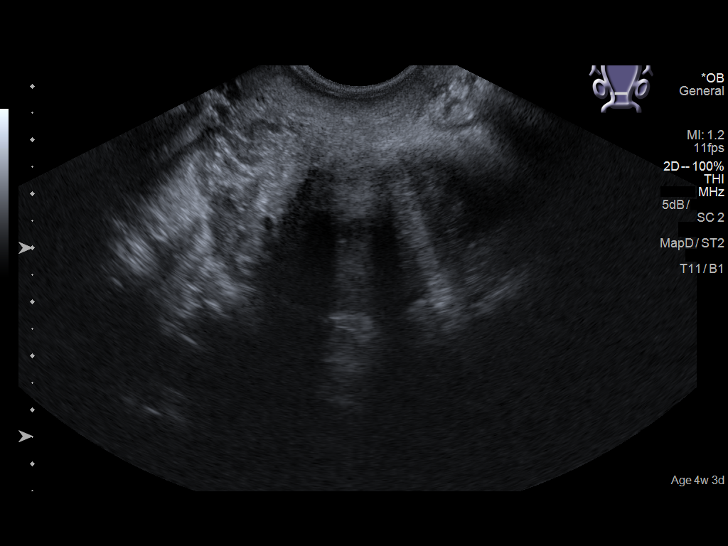
[im 74/96]
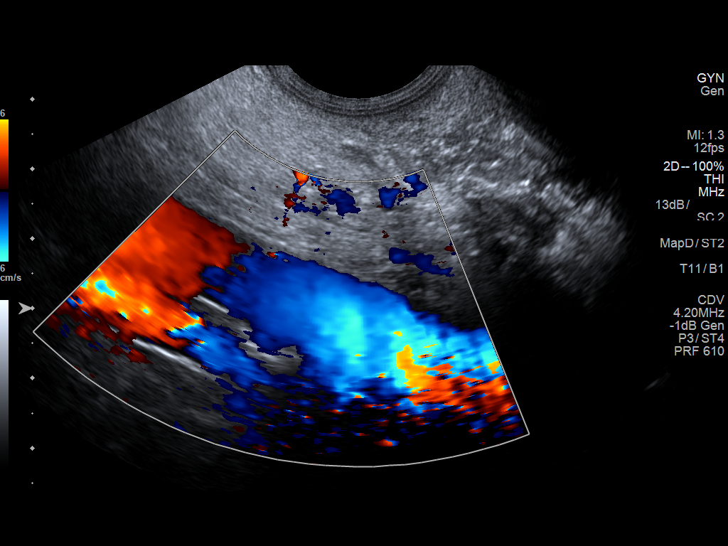
[im 81/96]
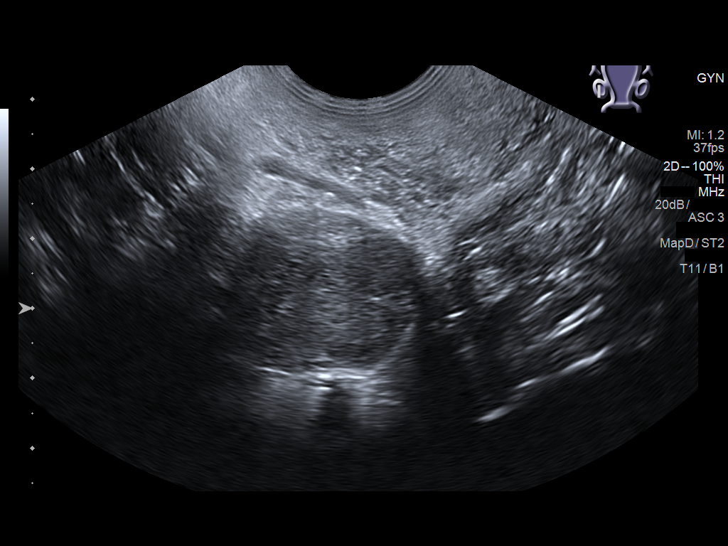
[im 88/96]
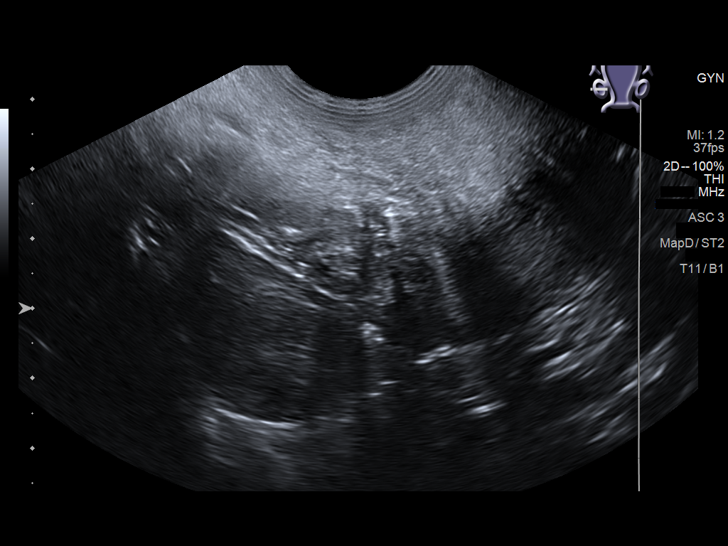
[im 96/96]
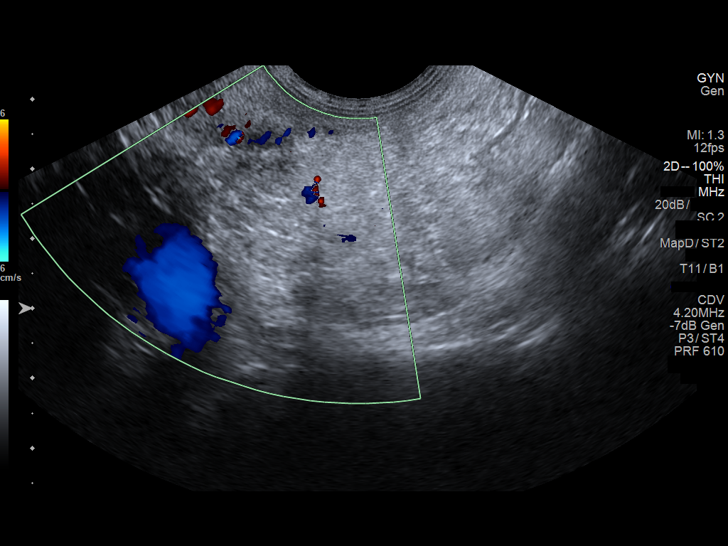

[14 of 28 positions shown; findings below may reference images not displayed]

FINDINGS: Intrauterine gestational sac: Single

Yolk sac:  Not Visualized.

Embryo:  Not Visualized.

MSD: 34 mm   5 w   0  d

Subchorionic hemorrhage:  None visualized.

Maternal uterus/adnexae: Right corpus luteum. The left ovary is not
visualized.
IMPRESSION: 1. Probable early intrauterine gestational sac, but no yolk sac,
fetal pole, or cardiac activity yet visualized. Recommend follow-up
quantitative B-HCG levels and follow-up US in 14 days to assess
viability. This recommendation follows SRU consensus guidelines:
Diagnostic Criteria for Nonviable Pregnancy Early in the First
Trimester. N Engl J Med 4821; [DATE].

## 2019-06-03 ENCOUNTER — Ambulatory Visit (LOCAL_COMMUNITY_HEALTH_CENTER): Payer: Self-pay

## 2019-06-03 ENCOUNTER — Other Ambulatory Visit: Payer: Self-pay

## 2019-06-03 DIAGNOSIS — Z111 Encounter for screening for respiratory tuberculosis: Secondary | ICD-10-CM

## 2019-06-06 ENCOUNTER — Other Ambulatory Visit: Payer: Self-pay

## 2019-06-06 ENCOUNTER — Ambulatory Visit (LOCAL_COMMUNITY_HEALTH_CENTER): Payer: Medicaid Other

## 2019-06-06 DIAGNOSIS — Z111 Encounter for screening for respiratory tuberculosis: Secondary | ICD-10-CM

## 2019-06-06 LAB — TB SKIN TEST
Induration: 0 mm
TB Skin Test: NEGATIVE

## 2019-11-08 ENCOUNTER — Ambulatory Visit: Payer: Self-pay | Admitting: Adult Health

## 2019-11-27 ENCOUNTER — Ambulatory Visit: Payer: Self-pay | Admitting: Adult Health

## 2019-11-27 NOTE — Progress Notes (Deleted)
New patient visit   Patient: Maria Espinoza   DOB: 06/30/1991   29 y.o. Female  MRN: [id number] Visit Date: 11/27/2019  Today's healthcare provider: Marcille Buffy, FNP   No chief complaint on file.  Subjective    Maria Espinoza is a 29 y.o. female who presents today as a new patient to establish care.  HPI  ***  Past Medical History:  Diagnosis Date  . Bipolar depression ([place])    Past Surgical History:  Procedure Laterality Date  . TONSILLECTOMY     Family Status  Relation Name Status  . Father  (Not Specified)  . MGM  (Not Specified)  . PGF  (Not Specified)   Family History  Problem Relation Age of Onset  . Migraines Father   . Heart disease Maternal Grandmother   . Depression Maternal Grandmother   . Hypertension Maternal Grandmother   . Breast cancer Paternal Grandfather    Social History   Socioeconomic History  . Marital status: Single    Spouse name: Not on file  . Number of children: Not on file  . Years of education: Not on file  . Highest education level: Not on file  Occupational History  . Not on file  Tobacco Use  . Smoking status: Current Every Day Smoker    Packs/day: 0.50  . Smokeless tobacco: Never Used  Substance and Sexual Activity  . Alcohol use: Yes    Comment: occasional  . Drug use: Never  . Sexual activity: Not on file  Other Topics Concern  . Not on file  Social History Narrative  . Not on file   Social Determinants of Health   Financial Resource Strain:   . Difficulty of Paying Living Expenses:   Food Insecurity:   . Worried About Charity fundraiser in the Last Year:   . Arboriculturist in the Last Year:   Transportation Needs:   . Film/video editor (Medical):   Marland Kitchen Lack of Transportation (Non-Medical):   Physical Activity:   . Days of Exercise per Week:   . Minutes of Exercise per Session:   Stress:   . Feeling of Stress :   Social Connections:   . Frequency of Communication with Friends and  Family:   . Frequency of Social Gatherings with Friends and Family:   . Attends Religious Services:   . Active Member of Clubs or Organizations:   . Attends Archivist Meetings:   Marland Kitchen Marital Status:    Outpatient Medications Prior to Visit  Medication Sig  . lamoTRIgine (LAMICTAL) 25 MG tablet Take 1 tablet (25 mg total) by mouth daily.  . pantoprazole (PROTONIX) 40 MG tablet Take 1 tablet (40 mg total) by mouth daily.  . sertraline (ZOLOFT) 50 MG tablet Take 1 tablet (50 mg total) by mouth daily.  . traMADol (ULTRAM) 50 MG tablet Take 1 tablet (50 mg total) by mouth every 6 (six) hours as needed.  . traZODone (DESYREL) 100 MG tablet Take 1 tablet (100 mg total) by mouth at bedtime.   No facility-administered medications prior to visit.   Allergies  Allergen Reactions  . Latex Rash    Immunization History  Administered Date(s) Administered  . Influenza,inj,Quad PF,6+ Mos 10/02/2018  . PPD Test 06/03/2019    Health Maintenance  Topic Date Due  . COVID-19 Vaccine (1) Never done  . INFLUENZA VACCINE  02/16/2020  . PAP-Cervical Cytology Screening  04/27/2021  . PAP SMEAR-Modifier  04/27/2021  .  TETANUS/TDAP  10/05/2022  . HIV Screening  Completed    Patient Care Team: Patient, No Pcp Per as PCP - General (General Practice)  Review of Systems  {Show previous labs (optional):23779::" "}  Objective    There were no vitals taken for this visit. Physical Exam ***  Depression Screen No flowsheet data found. No results found for any visits on 11/27/19.  Assessment & Plan     ***  No follow-ups on file.     {provider attestation***:1}   Jairo Ben, FNP  [facility] [phone] (phone) [phone] (fax)  [facility]

## 2020-01-09 ENCOUNTER — Ambulatory Visit: Payer: Medicaid Other

## 2020-01-13 ENCOUNTER — Ambulatory Visit: Payer: Medicaid Other

## 2020-01-15 ENCOUNTER — Ambulatory Visit: Payer: Medicaid Other | Admitting: Physician Assistant

## 2020-01-15 ENCOUNTER — Other Ambulatory Visit: Payer: Self-pay

## 2020-01-15 ENCOUNTER — Encounter: Payer: Self-pay | Admitting: Physician Assistant

## 2020-01-15 DIAGNOSIS — N760 Acute vaginitis: Secondary | ICD-10-CM | POA: Diagnosis not present

## 2020-01-15 DIAGNOSIS — Z113 Encounter for screening for infections with a predominantly sexual mode of transmission: Secondary | ICD-10-CM

## 2020-01-15 DIAGNOSIS — B9689 Other specified bacterial agents as the cause of diseases classified elsewhere: Secondary | ICD-10-CM

## 2020-01-15 LAB — WET PREP FOR TRICH, YEAST, CLUE
Trichomonas Exam: NEGATIVE
Yeast Exam: NEGATIVE

## 2020-01-15 MED ORDER — METRONIDAZOLE 500 MG PO TABS: 500.0000 mg | ORAL_TABLET | Freq: Two times a day (BID) | ORAL | 0 refills | Status: AC

## 2020-01-15 NOTE — Progress Notes (Signed)
Wet mount reviewed, pt treated per provider orders. Provider orders completed.

## 2020-01-15 NOTE — Progress Notes (Signed)
[facility] Department STI clinic/screening visit  Subjective:  Maria Espinoza is a 29 y.o. female being seen today for an STI screening visit. The patient reports they do have symptoms.  Patient reports that they do not desire a pregnancy in the next year.   They reported they are not interested in discussing contraception today.  Patient's last menstrual period was 01/14/2020 (exact date).   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  Denies other symptoms.  Reports that she is not currently taking medications by prescription but does take MVI and biotin, has had her tonsils removed, her last pap was 2019, and last HIV test was 2020.   See flowsheet for further details and programmatic requirements.    The following portions of the patient's history were reviewed and updated as appropriate: allergies, current medications, past medical history, past social history, past surgical history and problem list.  Objective:  There were no vitals filed for this visit.  Physical Exam Constitutional:      General: She is not in acute distress.    Appearance: Normal appearance.  HENT:     Head: Normocephalic and atraumatic.     Comments: No nits, lice, or hair loss. No cervical, supraclavicular or axillary adenopathy.    Mouth/Throat:     Mouth: Mucous membranes are moist.     Pharynx: Oropharynx is clear. No oropharyngeal  exudate or posterior oropharyngeal erythema.  Eyes:     Conjunctiva/sclera: Conjunctivae normal.  Pulmonary:     Effort: Pulmonary effort is normal.  Abdominal:     Palpations: Abdomen is soft. There is no mass.     Tenderness: There is no abdominal tenderness. There is no guarding or rebound.  Genitourinary:    General: Normal vulva.     Rectum: Normal.     Comments: External genitalia/pubic area without nits, lice, edema, erythema, lesions and inguinal adenopathy. Vagina with normal mucosa, small amount of thin, brownish discharge. Cervix without visibile lesions. Uterus firm, mobile, nt, no masses, no CMT, no adnexal tenderness or fullness. Musculoskeletal:     Cervical back: Neck supple. No tenderness.  Skin:    General: Skin is warm and dry.     Findings: No bruising, erythema, lesion or rash.  Neurological:     Mental Status: She is alert and oriented to person, place, and time.  Psychiatric:        Mood and Affect: Mood normal.        Behavior: Behavior normal.        Thought Content: Thought content normal.        Judgment: Judgment normal.      Assessment and Plan:  [name] is a 29 y.o. female presenting to the [facility] Department for STI screening  1. Screening for STD (sexually transmitted disease) Patient into clinic with symptoms today. Rec condoms with all sex. Await test results.  Counseled that RN will call if needs to  RTC for further treatment once results are back.  - WET PREP FOR TRICH, YEAST, CLUE - Chlamydia/Gonorrhea [place] [facility] - HIV [place] [facility] - Syphilis Serology, [place] [facility]  2. Bacterial vaginosis Will treat for BV with Metronidazole 500mg  #14 1 po BID for 7 days with food, no EtOH for 24 hr before and until 72 hr after completing medicine. No sex for  7 days. Enc to use OTC antifungal cream if has itching during or just after treatment with antibiotic. - metroNIDAZOLE (FLAGYL) 500 MG tablet; Take 1 tablet (500 mg total)  by mouth 2 (two) times daily for 7 days.  Dispense: 14 tablet; Refill: 0     No follow-ups on file.  No future appointments.  , PA

## 2020-01-23 ENCOUNTER — Other Ambulatory Visit: Payer: Self-pay

## 2020-01-23 ENCOUNTER — Ambulatory Visit: Payer: Medicaid Other

## 2020-01-23 DIAGNOSIS — A749 Chlamydial infection, unspecified: Secondary | ICD-10-CM

## 2020-01-23 MED ORDER — AZITHROMYCIN 500 MG PO TABS
1000.0000 mg | ORAL_TABLET | Freq: Once | ORAL | Status: AC
  Administered 2020-01-23: 1000 mg via ORAL

## 2020-03-06 ENCOUNTER — Other Ambulatory Visit: Payer: Medicaid Other

## 2020-03-20 IMAGING — DX ABDOMEN - 1 VIEW
1 series · 1 of 1 positions shown · non-contrast
Comparison: None.

CLINICAL DATA: Orogastric tube placement

EXAM:
ABDOMEN - 1 VIEW

[abdomen supine]
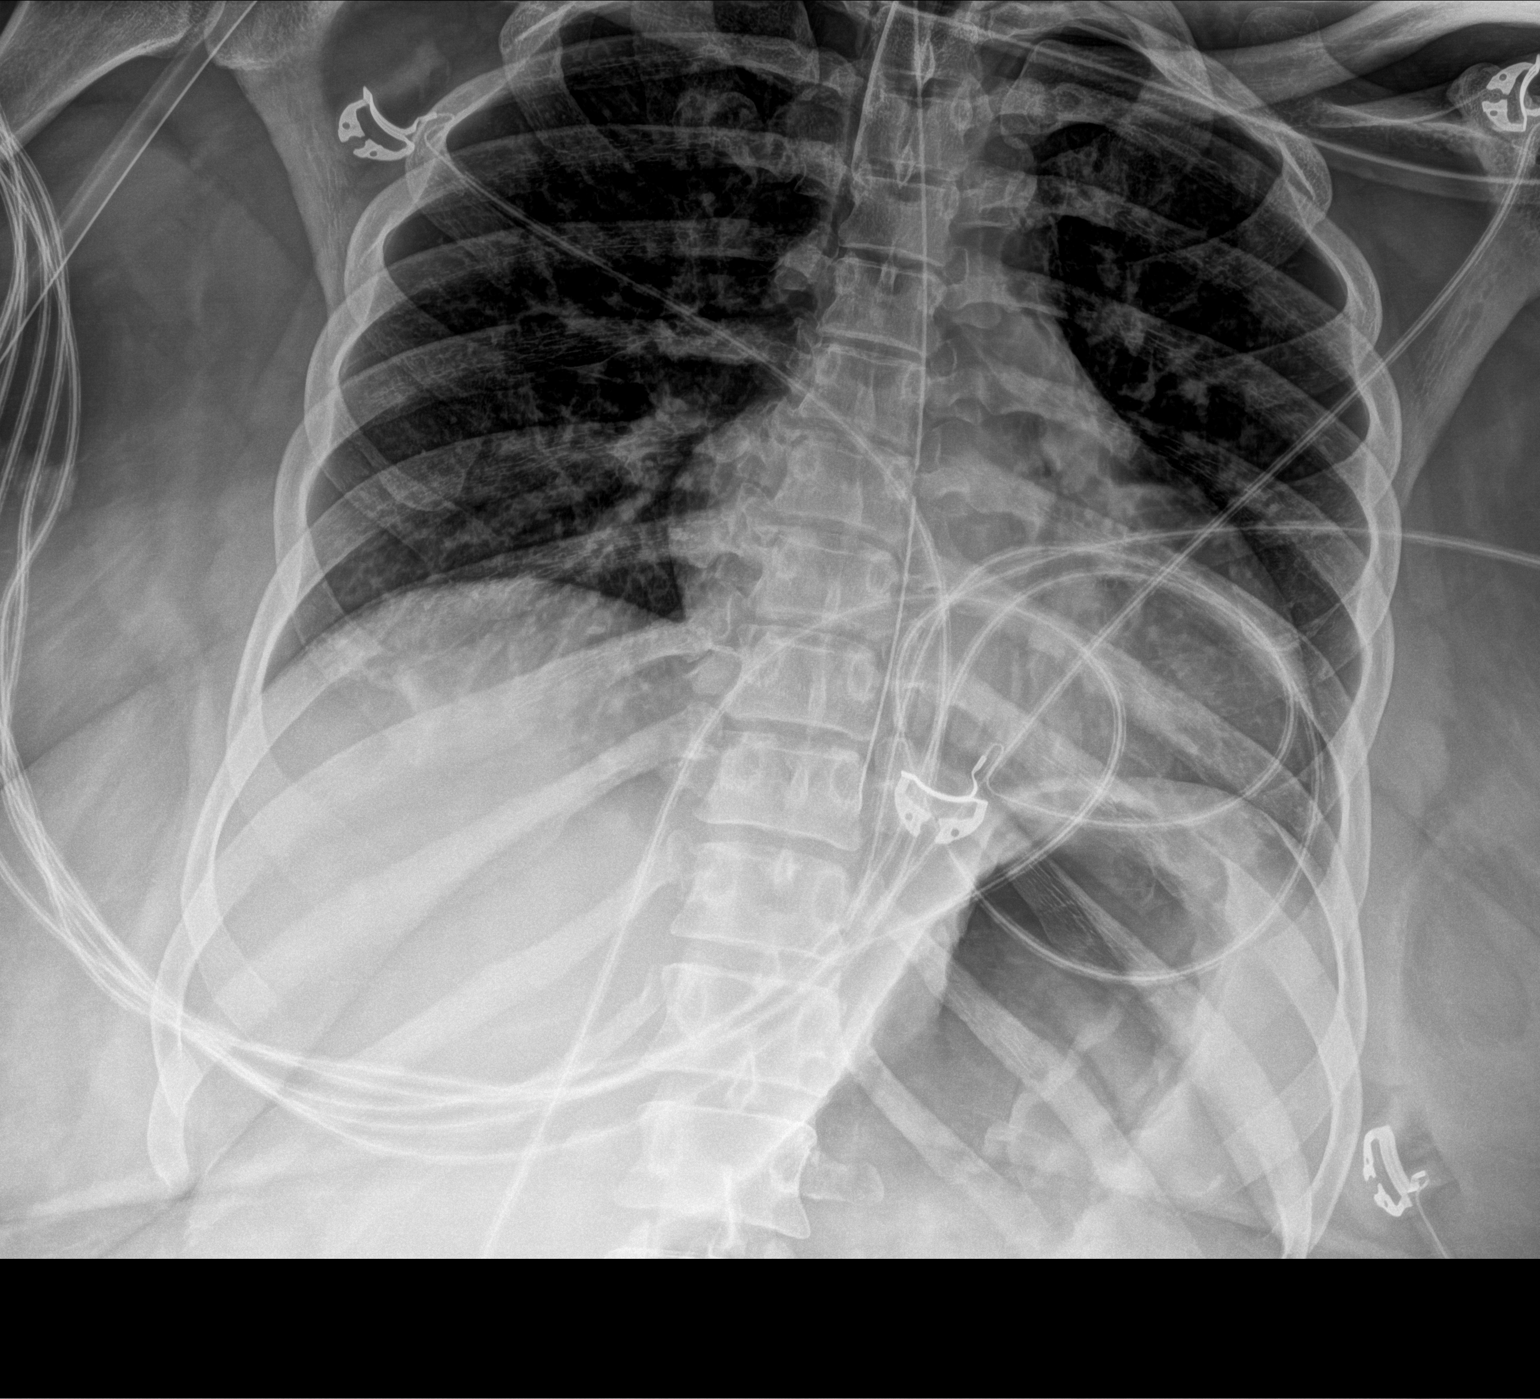

[1 of 1 positions shown; findings below may reference images not displayed]

FINDINGS: Two abdominal radiographs are provided. The first was obtained at
[DATE] a.m. and the second at [DATE] a.m. On the first radiograph, the
orogastric tube tip and side port project over the thoracic
esophagus. On the second radiograph, the tube has been advanced and
the side port projects over the stomach with the tip near the
gastroduodenal junction.

Lungs are clear. No dilated small bowel is visible.
IMPRESSION: Second radiograph shows the orogastric tube projecting over the
stomach with tip near the gastroduodenal junction.

## 2020-03-20 IMAGING — DX CHEST  1 VIEW
1 series · 1 of 1 positions shown · non-contrast
Comparison: None.

CLINICAL DATA: Endotracheal tube placement.

EXAM:
CHEST  1 VIEW

[chest ap]
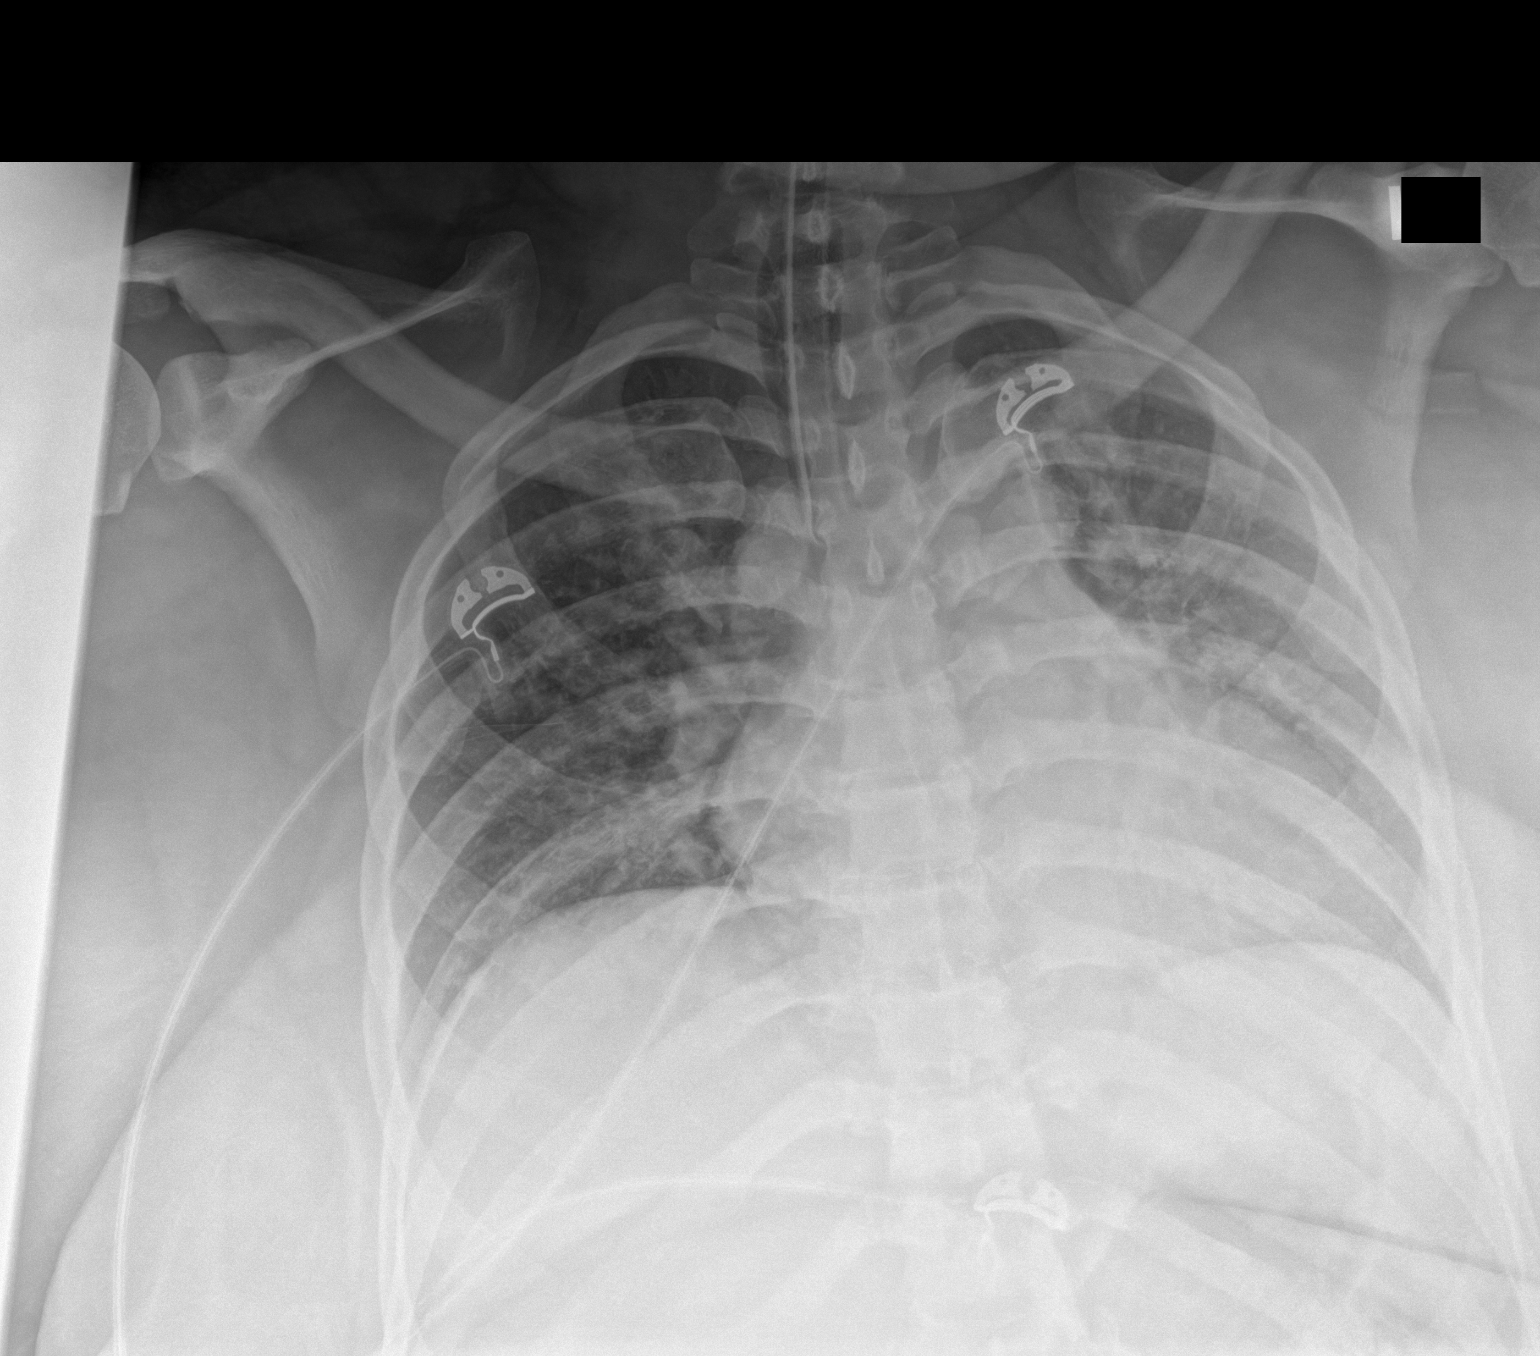

[1 of 1 positions shown; findings below may reference images not displayed]

FINDINGS: Endotracheal tube tip projects 7 mm above the carina.

Cardiac silhouette is normal in size. No mediastinal or hilar
masses.

Lung volumes are low. There is prominence of the bronchovascular
markings. No lung consolidation or convincing edema. No pleural
effusion or pneumothorax.

Skeletal structures are grossly intact.
IMPRESSION: 1. Endotracheal tube tip projects 7 mm above the carina.
2. No acute cardiopulmonary disease.

## 2020-05-26 ENCOUNTER — Other Ambulatory Visit: Payer: Self-pay

## 2020-05-26 ENCOUNTER — Ambulatory Visit: Payer: Medicaid Other | Admitting: Physician Assistant

## 2020-05-26 DIAGNOSIS — Z113 Encounter for screening for infections with a predominantly sexual mode of transmission: Secondary | ICD-10-CM | POA: Diagnosis not present

## 2020-05-26 LAB — WET PREP FOR TRICH, YEAST, CLUE
Trichomonas Exam: NEGATIVE
Yeast Exam: NEGATIVE

## 2020-05-26 NOTE — Progress Notes (Signed)
Post:  RN reviewed wet mount results with patient. No treatment per S.O. Provider orders complete.   Harvie Heck, RN

## 2020-05-27 ENCOUNTER — Encounter: Payer: Self-pay | Admitting: Physician Assistant

## 2020-05-27 NOTE — Progress Notes (Signed)
[facility] Department STI clinic/screening visit  Subjective:  Maria Espinoza is a 29 y.o. female being seen today for an STI screening visit. The patient reports they do not have symptoms.  Patient reports that they do not desire a pregnancy in the next year.   They reported they are not interested in discussing contraception today.  Patient's last menstrual period was 04/24/2020.   Patient has the following medical conditions:   Patient Active Problem List   Diagnosis Date Noted  . Social anxiety disorder 10/03/2018  . 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 Intentional doxepin overdose (HCC) 10/01/2018  . Bipolar disorder, unspecified (HCC) 07/24/2018  . Depression with anxiety 04/25/2018  . Migraines 04/25/2018  . Morbid obesity (HCC) 04/25/2018    Chief Complaint  Patient presents with  . SEXUALLY TRANSMITTED DISEASE    screening    HPI  Patient reports that she is not having any symptoms but would like a screening today.  Denies chronic conditions and regular medicines.  Reports that her last HIV test was 3 months ago and last pap was   See flowsheet for further details and programmatic requirements.    The following portions of the patient's history were reviewed and updated as appropriate: allergies, current medications, past medical history, past social history, past surgical history and problem list.  Objective:  There were no vitals filed for this visit.  Physical Exam Constitutional:      General: She is not in acute distress.    Appearance: Normal appearance.  HENT:     Head: Normocephalic and atraumatic.     Comments: No nits,lice, or hair loss. No cervical, supraclavicular or axillary adenopathy.    Mouth/Throat:     Mouth: Mucous membranes are moist.     Pharynx: Oropharynx is clear. No oropharyngeal exudate or posterior oropharyngeal erythema.  Eyes:     Conjunctiva/sclera: Conjunctivae normal.   Pulmonary:     Effort: Pulmonary effort is normal.  Abdominal:     Palpations: Abdomen is soft. There is no mass.     Tenderness: There is no abdominal tenderness. There is no guarding or rebound.  Genitourinary:    General: Normal vulva.     Rectum: Normal.     Comments: External genitalia/pubic area without nits, lice, edema, erythema, lesions and inguinal adenopathy. Vagina with normal mucosa and discharge. Cervix without visible lesions. Uterus firm, mobile, nt, no masses, no CMT, no adnexal tenderness or fullness. Musculoskeletal:     Cervical back: Neck supple. No tenderness.  Skin:    General: Skin is warm and dry.     Findings: No bruising, erythema, lesion or rash.  Neurological:     Mental Status: She is alert and oriented to person, place, and time.  Psychiatric:        Mood and Affect: Mood normal.        Behavior: Behavior normal.        Thought Content: Thought content normal.        Judgment: Judgment normal.      Assessment and Plan:  Maria Espinoza is a 29 y.o. female presenting to the [facility] Department for STI screening  1. Screening for STD (sexually transmitted disease) Patient into clinic without symptoms. Rec condoms with all sex. Await test results.  Counseled that RN will call if needs to RTC for treatment once results are back. - WET PREP FOR TRICH, YEAST, CLUE - Chlamydia/Gonorrhea [place] [facility] - HIV [place]  STATE LAB - Syphilis Serology, [place] [facility]     No follow-ups on file.  No future appointments.  Jerene Dilling, PA

## 2020-08-21 ENCOUNTER — Other Ambulatory Visit: Payer: Medicaid Other

## 2020-08-21 ENCOUNTER — Other Ambulatory Visit: Payer: Self-pay

## 2020-08-24 ENCOUNTER — Other Ambulatory Visit: Payer: Medicaid Other

## 2020-09-27 IMAGING — DX DG FOOT COMPLETE 3+V*R*
3 series · 3 of 3 positions shown · non-contrast
Comparison: None.

CLINICAL DATA: Right-sided foot pain for 2 weeks

EXAM:
RIGHT FOOT COMPLETE - 3+ VIEW

[foot ap]
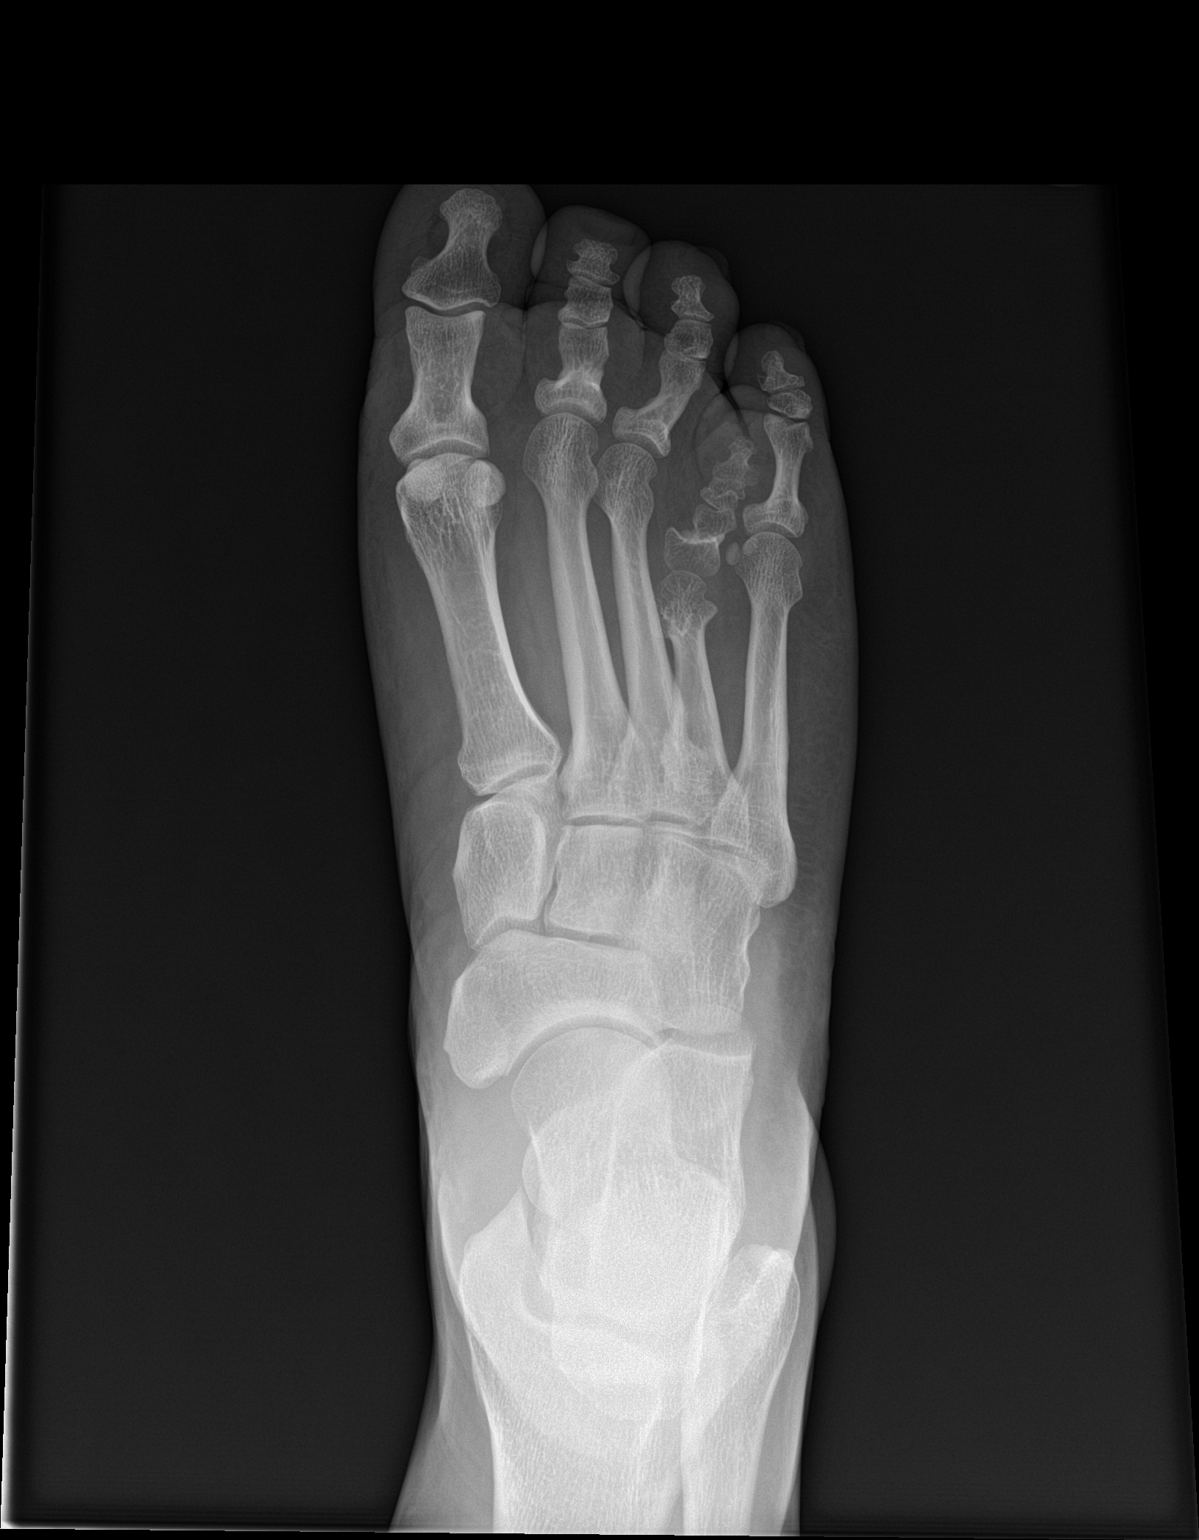

[foot obl]
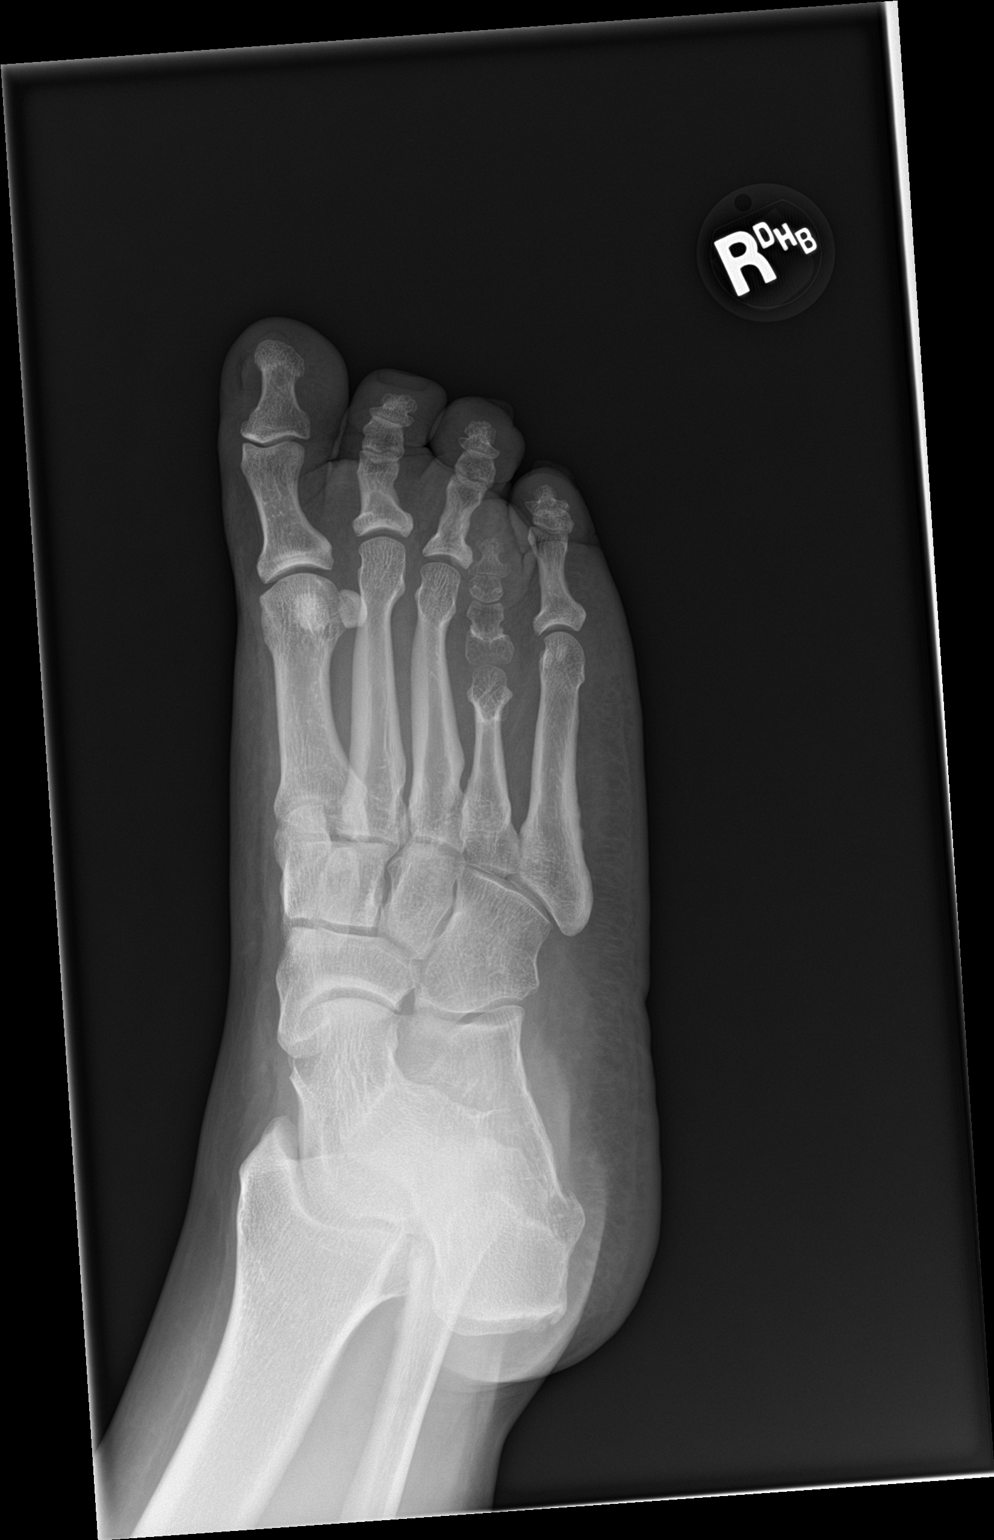

[foot lat]
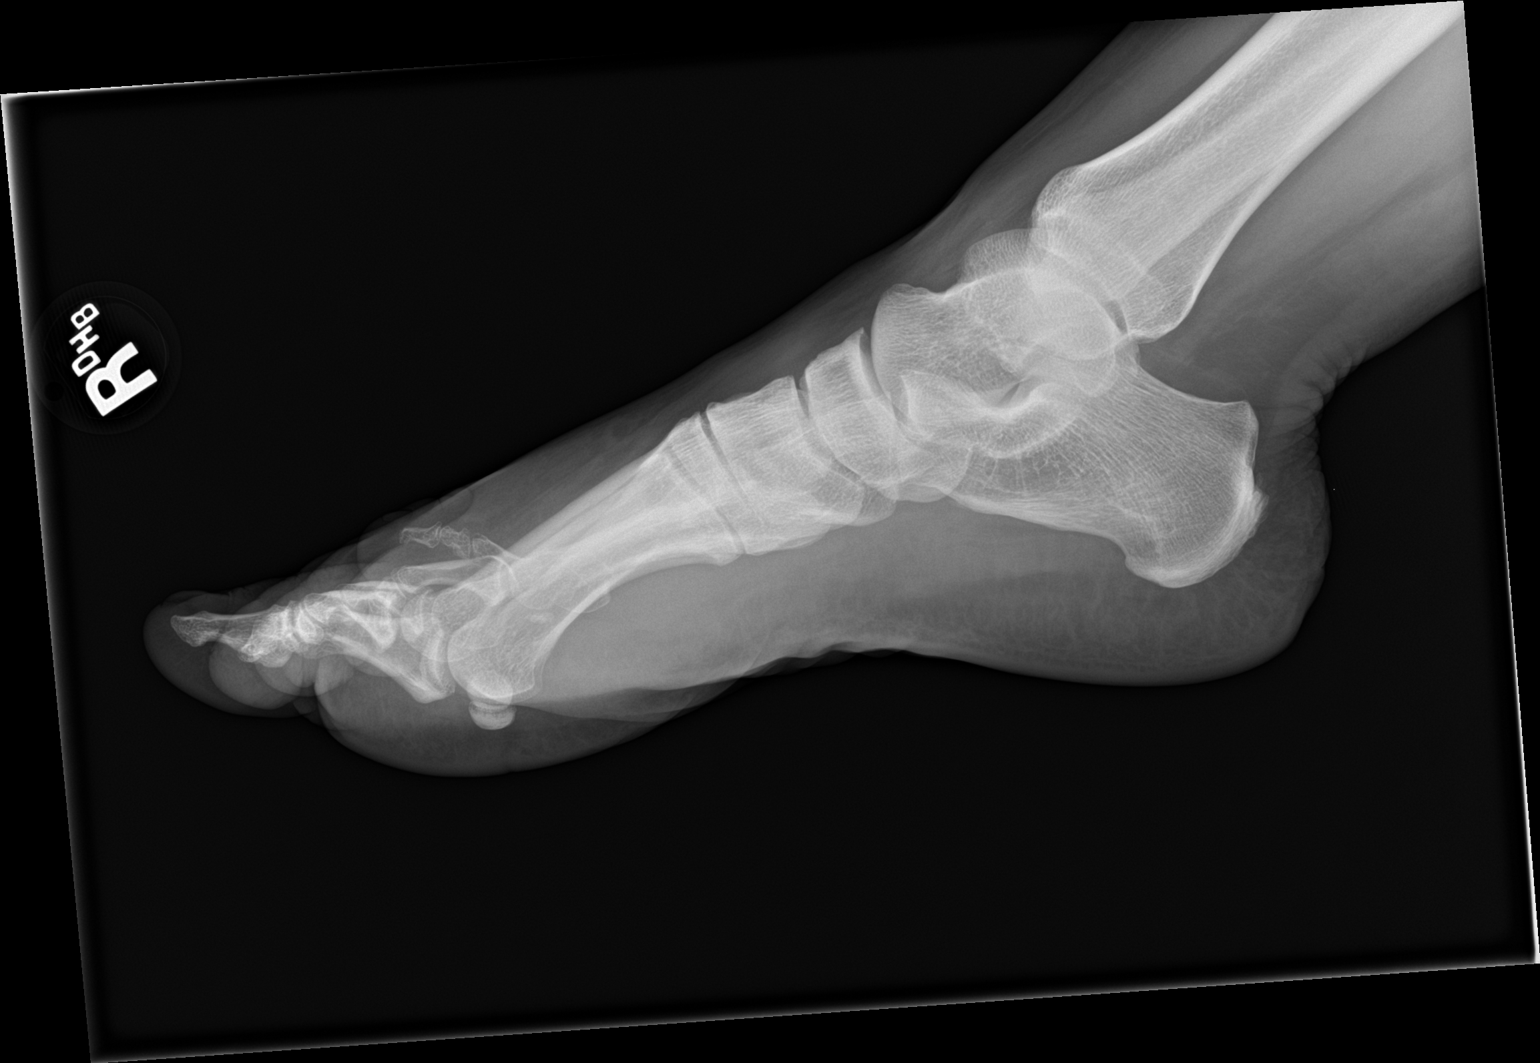

[3 of 3 positions shown; findings below may reference images not displayed]

FINDINGS: There is no evidence of fracture or dislocation. No erosion or
degenerative spurring. Bones in the fourth ray are hypoplastic and
osteopenic.
IMPRESSION: 1. No acute finding.
2. Bony hypoplasia involving the fourth ray.

## 2020-11-17 ENCOUNTER — Ambulatory Visit: Payer: Medicaid Other

## 2020-11-24 ENCOUNTER — Other Ambulatory Visit: Payer: Self-pay

## 2020-11-24 ENCOUNTER — Encounter: Payer: Self-pay | Admitting: Physician Assistant

## 2020-11-24 ENCOUNTER — Ambulatory Visit (LOCAL_COMMUNITY_HEALTH_CENTER): Payer: Medicaid Other | Admitting: Physician Assistant

## 2020-11-24 VITALS — BP 132/85 | Ht 63.0 in | Wt 292.8 lb

## 2020-11-24 DIAGNOSIS — Z30018 Encounter for initial prescription of other contraceptives: Secondary | ICD-10-CM | POA: Diagnosis not present

## 2020-11-24 DIAGNOSIS — Z01419 Encounter for gynecological examination (general) (routine) without abnormal findings: Secondary | ICD-10-CM

## 2020-11-24 DIAGNOSIS — Z3009 Encounter for other general counseling and advice on contraception: Secondary | ICD-10-CM

## 2020-11-24 DIAGNOSIS — R55 Syncope and collapse: Secondary | ICD-10-CM

## 2020-11-24 DIAGNOSIS — Z113 Encounter for screening for infections with a predominantly sexual mode of transmission: Secondary | ICD-10-CM

## 2020-11-24 DIAGNOSIS — B9689 Other specified bacterial agents as the cause of diseases classified elsewhere: Secondary | ICD-10-CM

## 2020-11-24 LAB — WET PREP FOR TRICH, YEAST, CLUE
Trichomonas Exam: NEGATIVE
Yeast Exam: NEGATIVE

## 2020-11-24 LAB — HM HEPATITIS C SCREENING LAB: HM Hepatitis Screen: NEGATIVE

## 2020-11-24 LAB — HM HIV SCREENING LAB: HM HIV Screening: NEGATIVE

## 2020-11-24 MED ORDER — METRONIDAZOLE 500 MG PO TABS: 500.0000 mg | ORAL_TABLET | Freq: Two times a day (BID) | ORAL | 0 refills | Status: AC

## 2020-11-24 NOTE — Progress Notes (Signed)
Pt here for PE, STD check, referral for her feet and dizziness.  Pt also wants COVID 19 vaccine and was informed to make an appointment for Mondays or Fridays, to get the vaccine.  [employer] results reviewed with Provider.  Medication dispensed. Pt informed to complete all of her medication and to avoid alcohol 24 hours prior to taking 1st dose and 72 hours after the last dose.  Berdie Ogren, RN

## 2020-11-25 ENCOUNTER — Encounter: Payer: Self-pay | Admitting: Physician Assistant

## 2020-11-25 NOTE — Progress Notes (Signed)
[facility] [address] Main Number: [phone]    Family Planning Visit- Initial Visit  Subjective:  Maria Espinoza is a 30 y.o.  [id number]   being seen today for an initial annual visit and to discuss contraceptive options.  The patient is currently using None for pregnancy prevention. Patient reports she does not know if she wants a pregnancy in the next year.  Patient has the following medical conditions has Bipolar disorder, unspecified (HCC); Depression with anxiety; Migraines; Morbid obesity (HCC); Intentional doxepin overdose (HCC); Social anxiety disorder; and Severe recurrent major depression without psychotic features (HCC) on their problem list.  Chief Complaint  Patient presents with  . Contraception    Annual visit    Patient reports that she is here for a physical and pap smear.  Patient also states that she wants a referral for fainting spells and headaches.  States that when she was 33 or 30 years old she was having these same type of fainting spells and headaches and she ended up having a CT or MRI that found the reason she was having this happen was that a part of her brain was sliding down out of place to cause this.  Reports that at the time she was told that this would likely progress and she would eventually need surgery to correct this.  States that she has had a fainting spell per month for the last 2-3 months.  Denies noticing any pattern or other associated symptoms.  Patient also reports that she has migraines with nausea and aura and that the only thing that she can do to relieve them is rest and take some Ibuprofen.  Reports that she has a hemorrhoid and that she will occasionally have a flare up when she has bleeding.    Patient denies other concerns today.    Body mass index is 51.87 kg/m. - Patient is eligible for diabetes screening based on BMI and age >24?  not applicable HA1C ordered? not  applicable  Patient reports 3 partner/s in last year. Desires STI screening?  Yes  Has patient been screened once for HCV in the past?  No  No results found for: HCVAB  Does the patient have current drug use (including MJ), have a partner with drug use, and/or has been incarcerated since last result? No  If yes-- Screen for HCV through [facility]   Does the patient meet criteria for HBV testing? No  Criteria:  -Household, sexual or needle sharing contact with HBV -History of drug use -HIV positive -Those with known Hep C   Health Maintenance Due  Topic Date Due  . COVID-19 Vaccine (1) Never done  . Hepatitis C Screening  Never done    Review of Systems  All other systems reviewed and are negative.   The following portions of the patient's history were reviewed and updated as appropriate: allergies, current medications, past family history, past medical history, past social history, past surgical history and problem list. Problem list updated.   See flowsheet for other program required questions.  Objective:   Vitals:   11/24/20 1323  BP: 132/85  Weight: 292 lb 12.8 oz (132.8 kg)  Height: 5\' 3"  (1.6 m)    Physical Exam Vitals and nursing note reviewed.  Constitutional:      General: She is not in acute distress.    Appearance: Normal appearance.  HENT:     Head: Normocephalic and atraumatic.  Mouth/Throat:     Mouth: Mucous membranes are moist.     Pharynx: Oropharynx is clear. No oropharyngeal exudate or posterior oropharyngeal erythema.  Eyes:     Conjunctiva/sclera: Conjunctivae normal.  Neck:     Thyroid: No thyroid mass, thyromegaly or thyroid tenderness.  Cardiovascular:     Rate and Rhythm: Normal rate and regular rhythm.  Pulmonary:     Effort: Pulmonary effort is normal.     Breath sounds: Normal breath sounds.  Chest:  Breasts:     Right: Normal.     Left: Normal.    Abdominal:     Palpations: Abdomen is soft. There is no mass.      Tenderness: There is no abdominal tenderness. There is no guarding or rebound.  Genitourinary:    General: Normal vulva.     Rectum: Normal.     Comments: External genitalia/pubic area without nits, lice, edema, erythema, lesions and inguinal adenopathy. Vagina with normal mucosa and small amount of thin, white discharge, pH= >4.5. Cervix without visible lesions. Uterus firm, mobile, nt, no masses, no CMT, no adnexal tenderness or fullness. Musculoskeletal:     Cervical back: Neck supple. No tenderness.  Lymphadenopathy:     Cervical: No cervical adenopathy.  Skin:    General: Skin is warm and dry.     Findings: No bruising, erythema, lesion or rash.  Neurological:     Mental Status: She is alert and oriented to person, place, and time.  Psychiatric:        Mood and Affect: Mood normal.        Behavior: Behavior normal.        Thought Content: Thought content normal.        Judgment: Judgment normal.       Assessment and Plan:  Maria Espinoza is a 30 y.o. female presenting to the [facility] Department for an initial annual wellness/contraceptive visit  Contraception counseling: Reviewed all forms of birth control options in the tiered based approach. available including abstinence; over the counter/barrier methods; hormonal contraceptive medication including pill, patch, ring, injection,contraceptive implant, ECP; hormonal and nonhormonal IUDs; permanent sterilization options including vasectomy and the various tubal sterilization modalities. Risks, benefits, and typical effectiveness rates were reviewed.  Questions were answered.  Written information was also given to the patient to review.  Patient desires to continue without hormonal contraception, this was prescribed for patient. She will follow up in  1 year and prn for surveillance.  She was told to call with any further questions, or with any concerns about this method of contraception.  Emphasized use of condoms 100%  of the time for STI prevention.  Patient was offered ECP based on unprotected sex in last 5 days. ECP was not accepted by the patient. ECP counseling was given.  1. Encounter for counseling regarding contraception Reviewed as above re: hormonal BCM and that patient can RTC at any time if she changes her mind. Enc condoms with all sex for STD and pregnancy prevention.  2. Screening for STD (sexually transmitted disease) Await test results.  Counseled that RN will call if needs to RTC for treatment once results are back.  - WET PREP FOR TRICH, YEAST, CLUE - Chlamydia/Gonorrhea [place] [facility] - HIV/HCV [facility] - Syphilis Serology, [facility]  3. Well woman exam with routine gynecological exam Reviewed with patient healthy habits to maintain general health. Enc patient to keep track of headaches and fainting spells prior to appointment with neurology.  Enc MVI 1 po daily. Enc to establish with/ follow up with PCP for primary care concerns, age appropriate screenings and illness. - IGP, Aptima HPV  4. Evaluation for contraception barrier or spermicide Enc to use condoms and that she can also use OTC spermicide to increase effectiveness.  5. BV (bacterial vaginosis) Treat BV with Metronidazole 500 mg #14 1 po BID with food, no EtOH for 24 hr before and until 72 hr after completing medicine. No sex for 10 days. Enc to use OTC antifungal cream if has itching during or just after antibiotic use. - metroNIDAZOLE (FLAGYL) 500 MG tablet; Take 1 tablet (500 mg total) by mouth 2 (two) times daily for 7 days.  Dispense: 14 tablet; Refill: 0  6. Syncope, unspecified syncope type Patient gives history of "fainting spells" and migraines as a child and had evaluation.  Told that she would eventually need surgery to correct this condition but does not remember where evaluation was done to retrieve records.  Requests referral from here for further evaluation. Patient counseled that [facility] does not  usually make referrals to specialists since we are not a PCP.  Patient asks for documentation that I refused to refer her.  I then explained that I can do a referral if she does not have a PCP, but that the referral may take longer and cost her more since insurance and Medicaid prefer these type of referrals to be from a PCP.  Patient then states that she does not have a PCP. Patient states that she wants me to refer her to Neurology. Counseled patient that I will send a referral to [facility] clinic for her and that it may take several weeks before she would hear from them about an appointment.  Patient agrees to this plan and that she will call me if she has not heard from [facility] in 3-4 weeks.     Return in about 1 year (around 11/24/2021) for RP and prn.  No future appointments.  Matt Holmes, PA

## 2020-11-27 LAB — IGP, APTIMA HPV
HPV Aptima: NEGATIVE
PAP Smear Comment: 0

## 2020-12-03 ENCOUNTER — Ambulatory Visit: Payer: Self-pay

## 2020-12-03 ENCOUNTER — Telehealth: Payer: Self-pay

## 2020-12-03 ENCOUNTER — Other Ambulatory Visit: Payer: Self-pay

## 2020-12-03 DIAGNOSIS — A749 Chlamydial infection, unspecified: Secondary | ICD-10-CM

## 2020-12-03 MED ORDER — AZITHROMYCIN 500 MG PO TABS
1000.0000 mg | ORAL_TABLET | Freq: Once | ORAL | Status: AC
Start: 2020-12-03 — End: 2020-12-03
  Administered 2020-12-03: 1000 mg via ORAL

## 2020-12-03 NOTE — Telephone Encounter (Signed)
PC to pt [facility] .

## 2020-12-03 NOTE — Telephone Encounter (Signed)
Pt returned call. Advised pt of positive test results for chlamydia. Pt was upset and stated that" everytime she comes here she always is positive for something". She stated she had received treatment for this before and was cleared. Advised that could be reinfection. Pt was asking questions about her cervix and offered to send message to provider or speak with provider. Pt then inquired what the treatment was for Chlamydia-advised her of standing orders. Pt voices she lived 5 minutes away and was coming this afternoon. Added pt to schedule .

## 2020-12-03 NOTE — Progress Notes (Signed)
In Nurse Clinic for chlamydia treatment. Last sex yesterday. On no birth control. Treated today per standing order Dr. Ralene Bathe with Azithromycin 1 gram po DOT. Advised to eat after appt. And to call [facility] if vomits within 2 hrs of taking med. Per pt request, copy of chlamydia results given to pt. Questions answered and reports understanding. Jerel Shepherd, RN

## 2020-12-04 ENCOUNTER — Ambulatory Visit: Payer: Self-pay

## 2020-12-04 DIAGNOSIS — A749 Chlamydial infection, unspecified: Secondary | ICD-10-CM

## 2020-12-04 MED ORDER — AZITHROMYCIN 500 MG PO TABS: 1000.0000 mg | ORAL_TABLET | Freq: Once | ORAL | Status: DC

## 2020-12-04 MED ORDER — AZITHROMYCIN 250 MG PO TABS
1000.0000 mg | ORAL_TABLET | Freq: Once | ORAL | Status: AC
Start: 2020-12-04 — End: 2020-12-04
  Administered 2020-12-04: 1000 mg via ORAL

## 2020-12-04 NOTE — Progress Notes (Signed)
Client treated for chlamydia 12/03/2020 in Nurse Clinic (as per Kateri Plummer RN phone call and scheduled appt). Client presents today stating she vomited shortly (less than 2 hours) after taking medicine yesterday and needs re-treated. Treated as per yesterday with Azithromycin per Dr. Alvester Morin standing orders. Jossie Ng, RN

## 2021-01-05 ENCOUNTER — Ambulatory Visit (INDEPENDENT_AMBULATORY_CARE_PROVIDER_SITE_OTHER): Payer: Medicaid Other | Admitting: Podiatry

## 2021-01-05 ENCOUNTER — Other Ambulatory Visit: Payer: Self-pay

## 2021-01-05 ENCOUNTER — Ambulatory Visit (INDEPENDENT_AMBULATORY_CARE_PROVIDER_SITE_OTHER): Payer: Medicaid Other

## 2021-01-05 ENCOUNTER — Other Ambulatory Visit: Payer: Self-pay | Admitting: Podiatry

## 2021-01-05 DIAGNOSIS — M79675 Pain in left toe(s): Secondary | ICD-10-CM

## 2021-01-05 DIAGNOSIS — Q72899 Other reduction defects of unspecified lower limb: Secondary | ICD-10-CM

## 2021-01-05 DIAGNOSIS — M216X2 Other acquired deformities of left foot: Secondary | ICD-10-CM

## 2021-01-05 DIAGNOSIS — M79674 Pain in right toe(s): Secondary | ICD-10-CM | POA: Diagnosis not present

## 2021-01-05 DIAGNOSIS — M216X1 Other acquired deformities of right foot: Secondary | ICD-10-CM

## 2021-01-05 NOTE — Progress Notes (Signed)
HPI: 30 y.o. 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  Patient states that she has had this ever since she was born.  Unfortunately, her father was sentenced to life time in prison and her mother passed away at the age of 22 and she never had proper care for medical attention to her feet.  She states that over the last several years they have been increasingly painful and tender and symptomatic.  She states that they cause significant insecurities about her feet.  She has read about her condition and would like to discuss surgery to correct for the short toes of the feet.  She has tried multiple different shoes to help alleviate pain with no improvement.  Past Medical History:  Diagnosis Date   Bipolar depression (HCC)    Dizziness    Migraines    Nausea    Rectal bleeding          Physical Exam: General: The patient is alert and oriented x3 in no acute distress.  Dermatology: Skin is warm, dry and supple bilateral lower extremities. Negative for open lesions or macerations.  Vascular: Palpable pedal pulses bilaterally. No edema or erythema noted. Capillary refill within normal limits.  Neurological: Epicritic and protective threshold grossly intact bilaterally.   Musculoskeletal Exam: Range of motion within normal limits to all pedal and ankle joints bilateral. Muscle strength 5/5 in all groups bilateral.  Associated tenderness to palpation along the metatarsals on the lateral aspect of the bilateral forefoot.  Left worse than the right.  Radiographic Exam:  Normal osseous mineralization.  Shortened fourth metatarsal bone noted to the bilateral feet.  There is also some disfiguration of the proximal phalanx to the fourth toes bilateral.  This may be angulation of the x-ray.  The joint spaces appear to be preserved.  No fractures identified.  Normal osseous mineralization.  Assessment: 1. Brachymetatarsia bilateral fourth  toes   Plan of Care:  1. Patient evaluated. X-Rays reviewed.  2. Today we discussed the conservative versus surgical management of the presenting pathology. The patient opts for surgical management. All possible complications and details of the procedure were explained. All patient questions were answered. No guarantees were expressed or implied.  We spoke in detail about the procedure and the benefits and disadvantages.  Risks and benefits were explained.  The patient is prepared for this surgery for several years and is in a position to pursue surgery at this time.  She understands this is a structural deformity that can only be corrected surgically.  Patient states that she is able to financially step away from work for up to 1 year to allow for postoperative healing. 3. Authorization for surgery was initiated today. Surgery will consist of fourth metatarsal osteotomy with application of mini rail external fixator for callus distraction/elongation of the metatarsals. 4.  Order placed for knee scooter  5.  Return to clinic 1 week postop       Felecia Shelling, DPM [facility]  Dr. Felecia Shelling, DPM    [address] [place], [place] [address]                Office [phone]  Fax [phone]

## 2021-01-06 ENCOUNTER — Telehealth: Payer: Self-pay

## 2021-01-06 NOTE — Telephone Encounter (Signed)
Received surgery paperwork from the [place] office. Tried calling to get surgery scheduled but phone number is system will not connect.

## 2021-01-08 ENCOUNTER — Telehealth: Payer: Self-pay | Admitting: *Deleted

## 2021-01-08 NOTE — Telephone Encounter (Signed)
"  I have a surgery that is scheduled for the first on my foot.  I talked to the doctor about a pill for the foot fungus.  He said it was a pill for 90 days.  I don't know if was waiting for me to take that after my surgery or if was going to prescribe it for me now.  I'm just trying to figure that out.  Can you give me a call back?"

## 2021-01-11 ENCOUNTER — Encounter
Admission: RE | Admit: 2021-01-11 | Discharge: 2021-01-11 | Disposition: A | Discharge status: Home / Self Care | Payer: Medicaid Other | Source: Ambulatory Visit | Attending: Podiatry | Admitting: Podiatry

## 2021-01-11 ENCOUNTER — Other Ambulatory Visit: Payer: Self-pay

## 2021-01-11 ENCOUNTER — Other Ambulatory Visit: Payer: Self-pay | Admitting: Podiatry

## 2021-01-11 DIAGNOSIS — Z01812 Encounter for preprocedural laboratory examination: Secondary | ICD-10-CM | POA: Insufficient documentation

## 2021-01-11 HISTORY — DX: Gastro-esophageal reflux disease without esophagitis: K21.9

## 2021-01-11 MED ORDER — TERBINAFINE HCL 250 MG PO TABS: 250.0000 mg | ORAL_TABLET | Freq: Every day | ORAL | 0 refills | Status: DC

## 2021-01-11 NOTE — Telephone Encounter (Signed)
I'll go ahead and send in the Rx for lamisil. She can start taking it now. - Dr. Logan Bores

## 2021-01-11 NOTE — H&P (View-Only) (Signed)
Onychomycosis of toenails 

## 2021-01-11 NOTE — Progress Notes (Signed)
Onychomycosis of toenails 

## 2021-01-11 NOTE — Patient Instructions (Signed)
Your procedure is scheduled on:01-15-21 Friday Report to the Registration Desk on the 1st floor of the Medical Mall-Then proceed to the 2nd floor Surgery Desk in the Medical Mall To find out your arrival time, please call [phone] between 1PM - 3PM on:01-14-21 Thursday  REMEMBER: Instructions that are not followed completely may result in serious medical risk, up to and including death; or upon the discretion of your surgeon and anesthesiologist your surgery may need to be rescheduled.  Do not eat food after midnight the night before surgery.  No gum chewing, lozengers or hard candies.  You may however, drink CLEAR liquids up to 2 hours before you are scheduled to arrive for your surgery. Do not drink anything within 2 hours of your scheduled arrival time.  Clear liquids include: - water  - apple juice without pulp - gatorade  - black coffee or tea (Do NOT add milk or creamers to the coffee or tea) Do NOT drink anything that is not on this list.  Do not take any medication the day of surgert  One week prior to surgery: Stop Anti-inflammatories (NSAIDS) such as Advil, Aleve, Ibuprofen, Motrin, Naproxen, Naprosyn and Aspirin based products such as Excedrin, Goodys Powder, BC Powder NOW (01-11-21) -You may however, continue to take Tylenol if needed for pain up until the day of surgery.  Stop ANY OVER THE COUNTER supplements/vitamins NOW (01-11-21) until after surgery.  No Alcohol for 24 hours before or after surgery.  No Smoking including e-cigarettes for 24 hours prior to surgery.  No chewable tobacco products for at least 6 hours prior to surgery.  No nicotine patches on the day of surgery.  Do not use any "recreational" drugs for at least a week prior to your surgery.  Please be advised that the combination of cocaine and anesthesia may have negative outcomes, up to and including death. If you test positive for cocaine, your surgery will be cancelled.  On the morning of surgery  brush your teeth with toothpaste and water, you may rinse your mouth with mouthwash if you wish. Do not swallow any toothpaste or mouthwash.  Do not wear jewelry, make-up, hairpins, clips or nail polish.  Do not wear lotions, powders, or perfumes.   Do not shave body from the neck down 48 hours prior to surgery just in case you cut yourself which could leave a site for infection.  Also, freshly shaved skin may become irritated if using the CHG soap.  Contact lenses, hearing aids and dentures may not be worn into surgery.  Do not bring valuables to the hospital. [facility] is not responsible for any missing/lost belongings or valuables.   Use CHG Soap as directed on instruction sheet.  Notify your doctor if there is any change in your medical condition (cold, fever, infection).  Wear comfortable clothing (specific to your surgery type) to the hospital.  After surgery, you can help prevent lung complications by doing breathing exercises.  Take deep breaths and cough every 1-2 hours. Your doctor may order a device called an Incentive Spirometer to help you take deep breaths. When coughing or sneezing, hold a pillow firmly against your incision with both hands. This is called "splinting." Doing this helps protect your incision. It also decreases belly discomfort.  If you are being admitted to the hospital overnight, leave your suitcase in the car. After surgery it may be brought to your room.  If you are being discharged the day of surgery, you will not be allowed  to drive home. You will need a responsible adult (18 years or older) to drive you home and stay with you that night.   If you are taking public transportation, you will need to have a responsible adult (18 years or older) with you. Please confirm with your physician that it is acceptable to use public transportation.   Please call the [place] Dept. at [phone] if you have any questions about these  instructions.  Surgery Visitation Policy:  Patients undergoing a surgery or procedure may have one family member or support person with them as long as that person is not COVID-19 positive or experiencing its symptoms.  That person may remain in the waiting area during the procedure.  Inpatient Visitation:    Visiting hours are 7 a.m. to 8 p.m. Inpatients will be allowed two visitors daily. The visitors may change each day during the patient's stay. No visitors under the age of 71. Any visitor under the age of 5 must be accompanied by an adult. The visitor must pass COVID-19 screenings, use hand sanitizer when entering and exiting the patient's room and wear a mask at all times, including in the patient's room. Patients must also wear a mask when staff or their visitor are in the room. Masking is required regardless of vaccination status.

## 2021-01-14 ENCOUNTER — Encounter: Payer: Self-pay | Admitting: Family Medicine

## 2021-01-14 ENCOUNTER — Ambulatory Visit (INDEPENDENT_AMBULATORY_CARE_PROVIDER_SITE_OTHER): Payer: Medicaid Other | Admitting: Family Medicine

## 2021-01-14 ENCOUNTER — Other Ambulatory Visit: Payer: Self-pay

## 2021-01-14 VITALS — BP 103/71 | HR 71 | Ht 63.0 in | Wt 291.4 lb

## 2021-01-14 DIAGNOSIS — Z01818 Encounter for other preprocedural examination: Secondary | ICD-10-CM

## 2021-01-14 DIAGNOSIS — Z7689 Persons encountering health services in other specified circumstances: Secondary | ICD-10-CM | POA: Insufficient documentation

## 2021-01-14 DIAGNOSIS — Z6841 Body Mass Index (BMI) 40.0 and over, adult: Secondary | ICD-10-CM

## 2021-01-14 NOTE — Addendum Note (Signed)
Addended by: Jobie Quaker on: 01/14/2021 10:36 AM   Modules accepted: Orders

## 2021-01-14 NOTE — Progress Notes (Signed)
Established Patient Office Visit  SUBJECTIVE:  Subjective  Patient ID: Maria Espinoza, female    DOB: [birth year]  Age: 30 y.o. MRN: [id number]  CC:  Chief Complaint  Patient presents with   New Patient (Initial Visit)    HPI Maria Espinoza is a 30 y.o. female presenting today for     Past Medical History:  Diagnosis Date   Bipolar depression (HCC)    Dizziness    Migraines    Nausea    Rectal bleeding     Past Surgical History:  Procedure Laterality Date   TONSILLECTOMY     age 30    Family History  Problem Relation Age of Onset   Migraines Father    Heart disease Maternal Grandmother    Depression Maternal Grandmother    Hypertension Maternal Grandmother    Breast cancer Paternal Grandfather     Social History   Socioeconomic History   Marital status: Single    Spouse name: Not on file   Number of children: Not on file   Years of education: Not on file   Highest education level: Not on file  Occupational History   Not on file  Tobacco Use   Smoking status: Some Days    Packs/day: 0.50    Years: 3.00    Pack years: 1.50    Types: Cigarettes   Smokeless tobacco: Never  Vaping Use   Vaping Use: Never used  Substance and Sexual Activity   Alcohol use: Not Currently    Comment: occasional   Drug use: Never   Sexual activity: Yes    Partners: Male  Other Topics Concern   Not on file  Social History Narrative   Not on file   Social Determinants of Health   Financial Resource Strain: Not on file  Food Insecurity: Not on file  Transportation Needs: Not on file  Physical Activity: Not on file  Stress: Not on file  Social Connections: Not on file  Intimate Partner Violence: Not At Risk   Fear of Current or Ex-Partner: No   Emotionally Abused: No   Physically Abused: No   Sexually Abused: No     Current Outpatient Medications:    acetaminophen (TYLENOL) 325 MG tablet, Take 650 mg by mouth every 6 (six) hours as needed for moderate pain.,  Disp: , Rfl:    ibuprofen (ADVIL) 200 MG tablet, Take 400 mg by mouth every 6 (six) hours as needed., Disp: , Rfl:    Multiple Vitamin (MULTIVITAMIN WITH MINERALS) TABS tablet, Take 1 tablet by mouth daily., Disp: , Rfl:    Allergies  Allergen Reactions   Latex Rash    ROS Review of Systems  Constitutional: Negative.   HENT: Negative.    Respiratory: Negative.    Cardiovascular: Negative.   Genitourinary: Negative.   Musculoskeletal: Negative.   Skin: Negative.   Psychiatric/Behavioral: Negative.      OBJECTIVE:    Physical Exam Constitutional:      Appearance: She is obese.  HENT:     Mouth/Throat:     Mouth: Mucous membranes are moist.  Cardiovascular:     Rate and Rhythm: Normal rate and regular rhythm.  Musculoskeletal:        General: Normal range of motion.  Skin:    General: Skin is warm.    BP 103/71   Pulse 71   Ht 5\' 3"  (1.6 m)   Wt 291 lb 6.4 oz (132.2 kg)   LMP 12/17/2020 (Approximate)  BMI 51.62 kg/m  Wt Readings from Last 3 Encounters:  01/14/21 291 lb 6.4 oz (132.2 kg)  11/24/20 292 lb 12.8 oz (132.8 kg)  04/10/19 280 lb (127 kg)    Health Maintenance Due  Topic Date Due   COVID-19 Vaccine (1) Never done   Pneumococcal Vaccine 53-71 Years old (1 - PCV) Never done    There are no preventive care reminders to display for this patient.  CBC Latest Ref Rng & Units 09/30/2018 06/12/2018  WBC 4.0 - 10.5 K/uL 7.3 7.5  Hemoglobin 12.0 - 15.0 g/dL 16.0 73.7  Hematocrit 10.6 - 46.0 % 40.5 38.4  Platelets 150 - 400 K/uL 336 334   CMP Latest Ref Rng & Units 10/03/2018 10/02/2018 10/01/2018  Glucose 70 - 99 mg/dL 90 269(S) 854(O)  BUN 6 - 20 mg/dL 14 6 7   Creatinine 0.44 - 1.00 mg/dL 2.70 3.50  Sodium 135 - 145 mmol/L 138 138 139  Potassium 3.5 - 5.1 mmol/L 3.9 3.3(L) 3.8  Chloride 98 - 111 mmol/L 108 104 104  CO2 22 - 32 mmol/L 24 26 24   Calcium 8.9 - 10.3 mg/dL 8.0(L) 8.1(L) 8.0(L)  Total Protein 6.5 - 8.1 g/dL - - -  Total Bilirubin 0.3 -  1.2 mg/dL - - -  Alkaline Phos 38 - 126 U/L - - -  AST 15 - 41 U/L - - -  ALT 0 - 44 U/L - - -    Lab Results  Component Value Date   TSH 3.614 10/03/2018   Lab Results  Component Value Date   ALBUMIN 3.1 (L) 10/01/2018   ANIONGAP 6 10/03/2018   Lab Results  Component Value Date   CHOL 134 10/03/2018   HDL 38 (L) 10/03/2018   LDLCALC 83 10/03/2018   CHOLHDL 3.5 10/03/2018   Lab Results  Component Value Date   TRIG 63 10/03/2018   Lab Results  Component Value Date   HGBA1C 5.3 10/03/2018      ASSESSMENT & PLAN:   Problem List Items Addressed This Visit       Other   Morbid obesity with BMI of 50.0-59.9, adult (HCC)    No diet at this time, she says that she is active but her diet is poor.  Plan- Discussed portion and carb control.        Establishing care with new doctor, encounter for - Primary    Patient in today to establish care with new provider, she does not have a primary care provider at this time but has a 10/05/2018. She has not seen her Psychiatrist in a year, has not been on her meds in a year but has seen her therapist. She describes mood swings with life situations and her children. She does want to get back onto her meds with Psych.          No orders of the defined types were placed in this encounter.     Follow-up: No follow-ups on file.    11-13-1994, FNP [facility] [address] [place], [address] [place]

## 2021-01-14 NOTE — Assessment & Plan Note (Signed)
No diet at this time, she says that she is active but her diet is poor.  Plan- Discussed portion and carb control.

## 2021-01-14 NOTE — Assessment & Plan Note (Signed)
Patient in today to establish care with new provider, she does not have a primary care provider at this time but has a Visual merchandiser. She has not seen her Psychiatrist in a year, has not been on her meds in a year but has seen her therapist. She describes mood swings with life situations and her children. She does want to get back onto her meds with Psych.

## 2021-01-15 ENCOUNTER — Other Ambulatory Visit: Payer: Self-pay

## 2021-01-15 ENCOUNTER — Ambulatory Visit: Payer: Medicaid Other | Admitting: Anesthesiology

## 2021-01-15 ENCOUNTER — Ambulatory Visit: Payer: Medicaid Other

## 2021-01-15 ENCOUNTER — Encounter: Payer: Self-pay | Admitting: Podiatry

## 2021-01-15 ENCOUNTER — Ambulatory Visit
Admission: RE | Admit: 2021-01-15 | Discharge: 2021-01-15 | Disposition: A | Discharge status: Home / Self Care | Payer: Medicaid Other | Attending: Podiatry | Admitting: Podiatry

## 2021-01-15 ENCOUNTER — Encounter
Admission: RE | Disposition: A | Discharge status: Home / Self Care | Payer: Self-pay | Source: Home / Self Care | Attending: Podiatry

## 2021-01-15 DIAGNOSIS — M7742 Metatarsalgia, left foot: Secondary | ICD-10-CM | POA: Diagnosis not present

## 2021-01-15 DIAGNOSIS — Q72899 Other reduction defects of unspecified lower limb: Secondary | ICD-10-CM | POA: Diagnosis not present

## 2021-01-15 DIAGNOSIS — F17200 Nicotine dependence, unspecified, uncomplicated: Secondary | ICD-10-CM | POA: Diagnosis not present

## 2021-01-15 DIAGNOSIS — Q6689 Other  specified congenital deformities of feet: Secondary | ICD-10-CM

## 2021-01-15 DIAGNOSIS — Z6841 Body Mass Index (BMI) 40.0 and over, adult: Secondary | ICD-10-CM | POA: Insufficient documentation

## 2021-01-15 DIAGNOSIS — M216X2 Other acquired deformities of left foot: Secondary | ICD-10-CM | POA: Insufficient documentation

## 2021-01-15 DIAGNOSIS — Z91040 Latex allergy status: Secondary | ICD-10-CM | POA: Diagnosis not present

## 2021-01-15 HISTORY — PX: METATARSAL OSTEOTOMY: SHX1641

## 2021-01-15 LAB — POCT PREGNANCY, URINE: Preg Test, Ur: NEGATIVE

## 2021-01-15 SURGERY — OSTEOTOMY, METATARSAL BONE
Anesthesia: General | Site: Toe | Laterality: Left

## 2021-01-15 MED ORDER — FENTANYL CITRATE (PF) 100 MCG/2ML IJ SOLN
INTRAMUSCULAR | Status: AC
  Filled 2021-01-15: qty 2

## 2021-01-15 MED ORDER — CHLORHEXIDINE GLUCONATE 0.12 % MT SOLN
15.0000 mL | Freq: Once | OROMUCOSAL | Status: AC
  Administered 2021-01-15: 15 mL via OROMUCOSAL

## 2021-01-15 MED ORDER — DEXMEDETOMIDINE (PRECEDEX) IN NS 20 MCG/5ML (4 MCG/ML) IV SYRINGE
PREFILLED_SYRINGE | INTRAVENOUS | Status: DC | PRN
  Administered 2021-01-15: 8 ug via INTRAVENOUS
  Administered 2021-01-15: 4 ug via INTRAVENOUS
  Administered 2021-01-15: 8 ug via INTRAVENOUS

## 2021-01-15 MED ORDER — GLYCOPYRROLATE 0.2 MG/ML IJ SOLN
INTRAMUSCULAR | Status: AC
  Filled 2021-01-15: qty 1

## 2021-01-15 MED ORDER — OXYCODONE HCL 5 MG PO TABS
ORAL_TABLET | ORAL | Status: AC
  Filled 2021-01-15: qty 1

## 2021-01-15 MED ORDER — LACTATED RINGERS IV SOLN: INTRAVENOUS | Status: DC

## 2021-01-15 MED ORDER — ROCURONIUM BROMIDE 100 MG/10ML IV SOLN
INTRAVENOUS | Status: DC | PRN
  Administered 2021-01-15: 50 mg via INTRAVENOUS
  Administered 2021-01-15 (×2): 20 mg via INTRAVENOUS

## 2021-01-15 MED ORDER — BUPIVACAINE HCL (PF) 0.5 % IJ SOLN
INTRAMUSCULAR | Status: AC
  Filled 2021-01-15: qty 30

## 2021-01-15 MED ORDER — ORAL CARE MOUTH RINSE: 15.0000 mL | Freq: Once | OROMUCOSAL | Status: AC

## 2021-01-15 MED ORDER — FENTANYL CITRATE (PF) 100 MCG/2ML IJ SOLN: 25.0000 ug | INTRAMUSCULAR | Status: DC | PRN

## 2021-01-15 MED ORDER — FENTANYL CITRATE (PF) 100 MCG/2ML IJ SOLN
INTRAMUSCULAR | Status: DC | PRN
  Administered 2021-01-15: 50 ug via INTRAVENOUS
  Administered 2021-01-15: 100 ug via INTRAVENOUS
  Administered 2021-01-15: 50 ug via INTRAVENOUS

## 2021-01-15 MED ORDER — OXYCODONE HCL 5 MG/5ML PO SOLN: 5.0000 mg | Freq: Once | ORAL | Status: DC | PRN

## 2021-01-15 MED ORDER — FAMOTIDINE 20 MG PO TABS
20.0000 mg | ORAL_TABLET | Freq: Once | ORAL | Status: AC
  Administered 2021-01-15: 20 mg via ORAL

## 2021-01-15 MED ORDER — SUGAMMADEX SODIUM 500 MG/5ML IV SOLN
INTRAVENOUS | Status: DC | PRN
  Administered 2021-01-15: 200 mg via INTRAVENOUS

## 2021-01-15 MED ORDER — TERBINAFINE HCL 250 MG PO TABS: 250.0000 mg | ORAL_TABLET | Freq: Every day | ORAL | 0 refills | Status: DC

## 2021-01-15 MED ORDER — MIDAZOLAM HCL 2 MG/2ML IJ SOLN
INTRAMUSCULAR | Status: DC | PRN
  Administered 2021-01-15: 2 mg via INTRAVENOUS

## 2021-01-15 MED ORDER — LIDOCAINE HCL (CARDIAC) PF 100 MG/5ML IV SOSY
PREFILLED_SYRINGE | INTRAVENOUS | Status: DC | PRN
  Administered 2021-01-15: 100 mg via INTRAVENOUS

## 2021-01-15 MED ORDER — MEPERIDINE HCL 25 MG/ML IJ SOLN: 6.2500 mg | INTRAMUSCULAR | Status: DC | PRN

## 2021-01-15 MED ORDER — FAMOTIDINE 20 MG PO TABS
ORAL_TABLET | ORAL | Status: AC
  Filled 2021-01-15: qty 1

## 2021-01-15 MED ORDER — PROMETHAZINE HCL 25 MG/ML IJ SOLN
6.2500 mg | INTRAMUSCULAR | Status: DC | PRN
Start: 2021-01-15 — End: 2021-01-16

## 2021-01-15 MED ORDER — CHLORHEXIDINE GLUCONATE 0.12 % MT SOLN
OROMUCOSAL | Status: AC
  Filled 2021-01-15: qty 15

## 2021-01-15 MED ORDER — ACETAMINOPHEN 10 MG/ML IV SOLN
INTRAVENOUS | Status: AC
  Filled 2021-01-15: qty 100

## 2021-01-15 MED ORDER — KETOROLAC TROMETHAMINE 30 MG/ML IJ SOLN
INTRAMUSCULAR | Status: DC | PRN
  Administered 2021-01-15: 30 mg via INTRAVENOUS

## 2021-01-15 MED ORDER — GLYCOPYRROLATE 0.2 MG/ML IJ SOLN
INTRAMUSCULAR | Status: DC | PRN
  Administered 2021-01-15: .2 mg via INTRAVENOUS

## 2021-01-15 MED ORDER — CEFAZOLIN IN SODIUM CHLORIDE 3-0.9 GM/100ML-% IV SOLN
3.0000 g | INTRAVENOUS | Status: AC
  Administered 2021-01-15: 3 g via INTRAVENOUS
  Filled 2021-01-15: qty 100

## 2021-01-15 MED ORDER — MIDAZOLAM HCL 2 MG/2ML IJ SOLN
INTRAMUSCULAR | Status: AC
  Filled 2021-01-15: qty 2

## 2021-01-15 MED ORDER — OXYCODONE-ACETAMINOPHEN 5-325 MG PO TABS: 1.0000 | ORAL_TABLET | ORAL | 0 refills | Status: DC | PRN

## 2021-01-15 MED ORDER — ONDANSETRON HCL 4 MG/2ML IJ SOLN
INTRAMUSCULAR | Status: DC | PRN
  Administered 2021-01-15: 4 mg via INTRAVENOUS

## 2021-01-15 MED ORDER — DEXAMETHASONE SODIUM PHOSPHATE 10 MG/ML IJ SOLN
INTRAMUSCULAR | Status: DC | PRN
  Administered 2021-01-15: 10 mg via INTRAVENOUS

## 2021-01-15 MED ORDER — OXYCODONE HCL 5 MG PO TABS: 5.0000 mg | ORAL_TABLET | Freq: Once | ORAL | Status: DC | PRN

## 2021-01-15 MED ORDER — BUPIVACAINE HCL (PF) 0.5 % IJ SOLN
INTRAMUSCULAR | Status: DC | PRN
  Administered 2021-01-15: 15 mL

## 2021-01-15 MED ORDER — ACETAMINOPHEN 10 MG/ML IV SOLN
INTRAVENOUS | Status: DC | PRN
  Administered 2021-01-15: 1000 mg via INTRAVENOUS

## 2021-01-15 MED ORDER — PROPOFOL 10 MG/ML IV BOLUS
INTRAVENOUS | Status: DC | PRN
  Administered 2021-01-15: 50 mg via INTRAVENOUS
  Administered 2021-01-15: 150 mg via INTRAVENOUS

## 2021-01-15 SURGICAL SUPPLY — 56 items
ASSEMBLY X-FIX MINI 55 (Miscellaneous) ×2 IMPLANT
BLADE MED AGGRESSIVE (BLADE) ×3 IMPLANT
BLADE SURG 15 STRL LF DISP TIS (BLADE) ×4 IMPLANT
BLADE SURG 15 STRL SS (BLADE) ×2
BLADE SURG MINI STRL (BLADE) IMPLANT
BNDG ELASTIC 4X5.8 VLCR NS LF (GAUZE/BANDAGES/DRESSINGS) ×3 IMPLANT
BNDG ESMARK 4X12 TAN STRL LF (GAUZE/BANDAGES/DRESSINGS) ×3 IMPLANT
BNDG GAUZE ELAST 4 BULKY (GAUZE/BANDAGES/DRESSINGS) ×3 IMPLANT
BOOT STEPPER DURA LG (SOFTGOODS) ×3 IMPLANT
CHLORAPREP W/TINT 26 (MISCELLANEOUS) ×6 IMPLANT
COVER PIN YLW 0.028-062 (MISCELLANEOUS) ×3 IMPLANT
CUFF TOURN SGL QUICK 12 (TOURNIQUET CUFF) IMPLANT
CUFF TOURN SGL QUICK 18X4 (TOURNIQUET CUFF) IMPLANT
CUFF TOURN SGL QUICK 24 (TOURNIQUET CUFF) ×1
CUFF TRNQT CYL 24X4X40X1 (TOURNIQUET CUFF) ×2 IMPLANT
ELECT BLADE 4 ULTRACLEAN (MISCELLANEOUS) ×3
ELECT REM PT RETURN 9FT ADLT (ELECTROSURGICAL) ×3
ELECTRODE BLADE 4 ULTRACLEAN (MISCELLANEOUS) ×2 IMPLANT
ELECTRODE REM PT RTRN 9FT ADLT (ELECTROSURGICAL) ×2 IMPLANT
GAUZE 4X4 16PLY ~~LOC~~+RFID DBL (SPONGE) ×3 IMPLANT
GAUZE SPONGE 4X4 12PLY STRL (GAUZE/BANDAGES/DRESSINGS) ×3 IMPLANT
GAUZE XEROFORM 1X8 LF (GAUZE/BANDAGES/DRESSINGS) ×3 IMPLANT
GLOVE SURG ENC MOIS LTX SZ7.5 (GLOVE) ×3 IMPLANT
GLOVE SURG SYN 7.0 (GLOVE) ×3 IMPLANT
GLOVE SURG UNDER POLY LF SZ7 (GLOVE) ×3 IMPLANT
GOWN STRL REUS W/ TWL XL LVL3 (GOWN DISPOSABLE) ×4 IMPLANT
GOWN STRL REUS W/TWL XL LVL3 (GOWN DISPOSABLE) ×2
K-WIRE .062X150MM ×3 IMPLANT
KIT TURNOVER KIT A (KITS) ×3 IMPLANT
KWIRE .062X150MM ×2 IMPLANT
LABEL OR SOLS (LABEL) ×3 IMPLANT
MANIFOLD NEPTUNE II (INSTRUMENTS) ×3 IMPLANT
NEEDLE FILTER BLUNT 18X 1/2SAF (NEEDLE) ×1
NEEDLE FILTER BLUNT 18X1 1/2 (NEEDLE) ×2 IMPLANT
NEEDLE HYPO 25X1 1.5 SAFETY (NEEDLE) ×9 IMPLANT
NS IRRIG 500ML POUR BTL (IV SOLUTION) ×3 IMPLANT
PACK EXTREMITY ARMC (MISCELLANEOUS) ×3 IMPLANT
PAD ABD DERMACEA PRESS 5X9 (GAUZE/BANDAGES/DRESSINGS) IMPLANT
PAD CAST CTTN 4X4 STRL (SOFTGOODS) ×2 IMPLANT
PADDING CAST COTTON 4X4 STRL (SOFTGOODS) ×1
PENCIL ELECTRO HAND CTR (MISCELLANEOUS) ×3 IMPLANT
PIN CORTICAL S DRILL 2.4X50X15 (Pin) ×9 IMPLANT
PIN CORTICAL S DRILL 2X4.50X10 (Pin) ×6 IMPLANT
STOCKINETTE M/LG 89821 (MISCELLANEOUS) ×3 IMPLANT
SUT MNCRL AB 3-0 PS2 27 (SUTURE) IMPLANT
SUT MNCRL AB 4-0 PS2 18 (SUTURE) IMPLANT
SUT MNCRL+ 5-0 UNDYED PC-3 (SUTURE) IMPLANT
SUT MONOCRYL 5-0 (SUTURE)
SUT PROLENE 4 0 PS 2 18 (SUTURE) ×6 IMPLANT
SYR 10ML LL (SYRINGE) ×6 IMPLANT
Stryker Allen Wrench ×3 IMPLANT
Stryker kwire 1.6mm x 150mm ×3 IMPLANT
WIRE Z .045 C-WIRE SPADE TIP (WIRE) ×3 IMPLANT
WIRE Z .062 C-WIRE SPADE TIP (WIRE) IMPLANT
WRENCH ALLEN 3.0 (INSTRUMENTS) ×3 IMPLANT
X-FIX MINI ASSEMBLY 55 (Miscellaneous) ×3 IMPLANT

## 2021-01-15 NOTE — Telephone Encounter (Signed)
I left her a message that the prescription was sent to her pharmacy.

## 2021-01-15 NOTE — Addendum Note (Signed)
Addended by: Enedina Finner on: 01/15/2021 05:22 PM   Modules accepted: Orders

## 2021-01-15 NOTE — Brief Op Note (Signed)
01/15/2021  5:36 PM  PATIENT:  Maria Espinoza  30 y.o. female  PRE-OPERATIVE DIAGNOSIS:  BRACHYMETATARSIA LEFT FOOT  POST-OPERATIVE DIAGNOSIS:  BRACHYMETATARSIA LEFT FOOT  PROCEDURE:  Procedure(s): METATARSAL OSTEOTOMY (Left) APLICATION OF External Fixation (Left)  SURGEON:  Surgeon(s) and Role:    Felecia Shelling, DPM - Primary  PHYSICIAN ASSISTANT:   ASSISTANTS: none   ANESTHESIA:   local and general  EBL:  5 mL   BLOOD ADMINISTERED:none  DRAINS: none   LOCAL MEDICATIONS USED:  MARCAINE     SPECIMEN:  No Specimen  DISPOSITION OF SPECIMEN:  N/A  COUNTS:  YES  TOURNIQUET:   Total Tourniquet Time Documented: Calf (Left) - 78 minutes Total: Calf (Left) - 78 minutes   DICTATION: .Reubin Milan Dictation  PLAN OF CARE: Discharge to home after PACU  PATIENT DISPOSITION:  PACU - hemodynamically stable.   Delay start of Pharmacological VTE agent (>24hrs) due to surgical blood loss or risk of bleeding: not applicable  Felecia Shelling, DPM [facility]  Dr. Felecia Shelling, DPM    [address] [place], [place] [address]                Office [phone]  Fax [phone]

## 2021-01-15 NOTE — Anesthesia Postprocedure Evaluation (Signed)
Anesthesia Post Note  Patient: Maria Espinoza  Procedure(s) Performed: METATARSAL OSTEOTOMY (Left: Toe) APLICATION OF External Fixation (Left)  Patient location during evaluation: PACU Anesthesia Type: General Level of consciousness: awake and alert Pain management: pain level controlled Vital Signs Assessment: post-procedure vital signs reviewed and stable Respiratory status: spontaneous breathing, nonlabored ventilation, respiratory function stable and patient connected to nasal cannula oxygen Cardiovascular status: blood pressure returned to baseline and stable Postop Assessment: no apparent nausea or vomiting Anesthetic complications: no   No notable events documented.   Last Vitals:  Vitals:   01/15/21 1800 01/15/21 1833  BP:  (!) 142/77  Pulse:  (!) 58  Resp:  20  Temp: (!) 36.4 C 36.6 C  SpO2:  100%    Last Pain:  Vitals:   01/15/21 1833  TempSrc: Temporal  PainSc: 2                  Lenard Simmer

## 2021-01-15 NOTE — Anesthesia Preprocedure Evaluation (Signed)
Anesthesia Evaluation  Patient identified by MRN, date of birth, ID band Patient awake    Reviewed: Allergy & Precautions, NPO status , Patient's Chart, lab work & pertinent test results  History of Anesthesia Complications Negative for: history of anesthetic complications  Airway Mallampati: III  TM Distance: >3 FB Neck ROM: Full    Dental no notable dental hx.    Pulmonary neg sleep apnea, neg COPD, Current Smoker and Patient abstained from smoking.,    breath sounds clear to auscultation- rhonchi (-) wheezing      Cardiovascular Exercise Tolerance: Good (-) hypertension(-) CAD, (-) Past MI, (-) Cardiac Stents and (-) CABG  Rhythm:Regular Rate:Normal - Systolic murmurs and - Diastolic murmurs    Neuro/Psych  Headaches, neg Seizures PSYCHIATRIC DISORDERS Anxiety Depression Bipolar Disorder    GI/Hepatic negative GI ROS, Neg liver ROS,   Endo/Other  negative endocrine ROSneg diabetes  Renal/GU negative Renal ROS     Musculoskeletal negative musculoskeletal ROS (+)   Abdominal (+) + obese,   Peds  Hematology negative hematology ROS (+)   Anesthesia Other Findings Past Medical History: No date: Bipolar depression (HCC) No date: Dizziness No date: Migraines No date: Nausea No date: Rectal bleeding   Reproductive/Obstetrics                             Anesthesia Physical Anesthesia Plan  ASA: 2  Anesthesia Plan: General   Post-op Pain Management:    Induction: Intravenous  PONV Risk Score and Plan: 1 and Ondansetron and Dexamethasone  Airway Management Planned: Oral ETT  Additional Equipment:   Intra-op Plan:   Post-operative Plan: Extubation in OR  Informed Consent: I have reviewed the patients History and Physical, chart, labs and discussed the procedure including the risks, benefits and alternatives for the proposed anesthesia with the patient or authorized representative  who has indicated his/her understanding and acceptance.     Dental advisory given  Plan Discussed with: CRNA and Anesthesiologist  Anesthesia Plan Comments:         Anesthesia Quick Evaluation

## 2021-01-15 NOTE — Interval H&P Note (Signed)
History and Physical Interval Note:  01/15/2021 2:50 PM  Maria Espinoza  has presented today for surgery, with the diagnosis of BRACHYMETATARSIA LEFT FOOT.  The various methods of treatment have been discussed with the patient and family. After consideration of risks, benefits and other options for treatment, the patient has consented to  Procedure(s): METATARSAL OSTEOTOMY (Left) APLICATION OF External Fixation (Left) as a surgical intervention.  The patient's history has been reviewed, patient examined, no change in status, stable for surgery.  I have reviewed the patient's chart and labs.  Questions were answered to the patient's satisfaction.     Felecia Shelling

## 2021-01-15 NOTE — Op Note (Signed)
OPERATIVE REPORT Patient name: Maria Espinoza MRN: [id number] DOB: 1991-04-01  DOS: 01/15/2021  Preop Dx: Brachymetatarsia fourth metatarsal left foot Postop Dx: same  Procedure:  1.  Osteotomy fourth metatarsal left foot with application of external fixator  Surgeon: Felecia Shelling DPM  Anesthesia: General anesthesia  Hemostasis: Calf tourniquet inflated to a pressure of after esmarch exsanguination   EBL: Minimal mL Materials: [employer] mini rail external fixator Injectables: 15 mL Marcaine plain Pathology: None  Condition: The patient tolerated the procedure and anesthesia well. No complications noted or reported   Justification for procedure: The patient is a 30 y.o. female who presents today for surgical correction of brachia metatarsalgia fourth metatarsal left foot. All conservative modalities of been unsuccessful in providing any sort of satisfactory alleviation of symptoms with the patient. The patient was told benefits as well as possible side effects of the surgery. The patient consented for surgical correction. The patient consent form was reviewed. All patient questions were answered. No guarantees were expressed or implied. The patient and the surgeon both signed the patient consent form with the witness present and placed in the patient's chart.   Procedure in Detail: The patient was brought to the operating room, placed in the operating table in the supine position at which time an aseptic scrub and drape were performed about the patient's respective lower extremity after anesthesia was induced as described above. Attention was then directed to the surgical area where procedure number one commenced.  Procedure #1: Fourth metatarsal osteotomy with application of external fixator A 5 cm linear longitudinal skin incision was planned and made overlying the fourth metatarsal of the left foot.  The incision was carried down to the level of bone with care taken to cut  clamp ligate or retract away all small neurovascular structures traversing the incision site.  The fourth metatarsal was identified and placement of the percutaneous self driving fixation pins for the mini rail external fixator were placed appropriately into the metatarsal.  This was verified by intraoperative x-ray fluoroscopy.  After appropriate placement of the external fixator pins, an osteotomy was performed using a sagittal blade mounted on a sagittal saw.  The external fixator was then applied and compressed slightly.  Again, the external fixator was verified by intraoperative x-ray fluoroscopy and the osteotomy site was also verified which was satisfactory.  The osteotomy site was planned at the metaphyseal diaphyseal junction.  After application of the external fixator attention was directed to the fourth MTPJ of the surgical foot.  A McGlamery elevator was utilized to release the fourth MTPJ of the foot.  This allowed for more mobility at the MTPJ and a 0.045 K wire was driven through the fourth MTPJ to prevent subluxation or dorsal dislocation as the fourth metatarsal was distracted out distally.  The percutaneous portion of the K wire was bent dorsal at a 90 degree angle and cut with a synthetic ball cap placed over the percutaneous portion.  Again, this K wire was verified by intraoperative x-ray fluoroscopy which was satisfactory.  Copious irrigation was then utilized in preparation for primary closure.  4-0 Prolene was utilized to reapproximate the skin edges around the external fixator pins.  Dry sterile compressive dressings were then applied to all previously mentioned incision sites about the patient's lower extremity. The tourniquet which was used for hemostasis was deflated. All normal neurovascular responses including pink color and warmth returned all the digits of patient's lower extremity.  The patient was then transferred from  the operating room to the recovery room having tolerated  the procedure and anesthesia well. All vital signs are stable. After a brief stay in the recovery room the patient was discharged with adequate prescriptions for analgesia. Verbal as well as written instructions were provided for the patient regarding wound care. The patient is to keep the dressings clean dry and intact until they are to follow surgeon Dr. Gala Lewandowsky in the office upon discharge.   Felecia Shelling, DPM [facility]  Dr. Felecia Shelling, DPM    [address] [place], [place] [address]                Office [phone]  Fax [phone]

## 2021-01-15 NOTE — Transfer of Care (Signed)
Immediate Anesthesia Transfer of Care Note  Patient: Maria Espinoza  Procedure(s) Performed: METATARSAL OSTEOTOMY (Left: Toe) APLICATION OF External Fixation (Left)  Patient Location: PACU  Anesthesia Type:General  Level of Consciousness: awake, alert  and oriented  Airway & Oxygen Therapy: Patient Spontanous Breathing and Patient connected to face mask oxygen  Post-op Assessment: Report given to RN and Post -op Vital signs reviewed and stable  Post vital signs: Reviewed and stable  Last Vitals:  Vitals Value Taken Time  BP 126/55 01/15/21 1715  Temp    Pulse 70 01/15/21 1717  Resp 18 01/15/21 1717  SpO2 99 % 01/15/21 1717  Vitals shown include unvalidated device data.  Last Pain: There were no vitals filed for this visit.       Complications: No notable events documented.

## 2021-01-15 NOTE — Discharge Instructions (Signed)

## 2021-01-15 NOTE — Anesthesia Procedure Notes (Signed)
Procedure Name: Intubation Date/Time: 01/15/2021 3:11 PM Performed by: Joanette Gula, Gerturde Kuba, CRNA Pre-anesthesia Checklist: Patient identified, Emergency Drugs available, Suction available and Patient being monitored Patient Re-evaluated:Patient Re-evaluated prior to induction Oxygen Delivery Method: Circle system utilized Preoxygenation: Pre-oxygenation with 100% oxygen Induction Type: IV induction Ventilation: Mask ventilation without difficulty Laryngoscope Size: McGraph and 3 Grade View: Grade I Tube type: Oral Tube size: 7.0 mm Number of attempts: 1 Airway Equipment and Method: Stylet Placement Confirmation: ETT inserted through vocal cords under direct vision, positive ETCO2 and breath sounds checked- equal and bilateral Secured at: 20 cm Tube secured with: Tape Dental Injury: Teeth and Oropharynx as per pre-operative assessment

## 2021-01-19 ENCOUNTER — Encounter: Payer: Self-pay | Admitting: Podiatry

## 2021-01-22 ENCOUNTER — Encounter: Payer: Self-pay | Admitting: Podiatry

## 2021-01-22 ENCOUNTER — Ambulatory Visit (INDEPENDENT_AMBULATORY_CARE_PROVIDER_SITE_OTHER): Payer: Medicaid Other | Admitting: Podiatry

## 2021-01-22 ENCOUNTER — Ambulatory Visit (INDEPENDENT_AMBULATORY_CARE_PROVIDER_SITE_OTHER): Payer: Medicaid Other

## 2021-01-22 ENCOUNTER — Other Ambulatory Visit: Payer: Self-pay

## 2021-01-22 VITALS — BP 115/76 | Temp 98.3°F

## 2021-01-22 DIAGNOSIS — M216X2 Other acquired deformities of left foot: Secondary | ICD-10-CM | POA: Diagnosis not present

## 2021-01-22 DIAGNOSIS — Z9889 Other specified postprocedural states: Secondary | ICD-10-CM

## 2021-01-22 DIAGNOSIS — Q72899 Other reduction defects of unspecified lower limb: Secondary | ICD-10-CM

## 2021-01-22 NOTE — Progress Notes (Signed)
   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 DOS: 01/15/2021.  Patient states that she is doing well.  She is kept the dressings clean dry and intact.  She continues to have pain to the area and the oxycodone helps with the pain management.  She does experience some slight nausea if she takes oxycodone on a empty stomach.  Otherwise she has been nonweightbearing in the cam boot using the knee scooter.  No new complaints at this time  Past Medical History:  Diagnosis Date   Bipolar depression (HCC)    Dizziness    Migraines    Nausea    Rectal bleeding       Objective/Physical Exam Neurovascular status intact.  Skin incisions appear to be well coapted with sutures intact. No sign of infectious process noted. No dehiscence. No active bleeding noted. Moderate edema noted to the surgical extremity.  The external mini rail appear stable and solid with good alignment  Radiographic Exam:  Mini rail external fixator and osteotomy site of the fourth metatarsal appear to be stable with routine healing. The mini rail demonstrates good alignment with the fourth metatarsal both lateral paralleling the metatarsal declination angle and AP overlying the fourth metatarsal.  Osteotomy site visualized at the metaphyseal diaphyseal junction of the fourth metatarsal  Assessment: 1. s/p brachymetatarsia surgery LT with application of mini rail external fixator. DOS: 01/15/2021   Plan of Care:  1. Patient was evaluated. X-rays reviewed 2.  Dressings changed today.   3.  Continue strict nonweightbearing using the cam boot and knee scooter 4.  Freida Busman wrench was provided for the patient today.  The osteotomy site has now been compressed and immobile for 1 week.  Strict instructions for 1/4 turn 4x daily equaling 47mm of distraction per day.  Patient understands.  5.  Return to clinic in 1  week   Felecia Shelling, DPM [facility]  Dr. Felecia Shelling, DPM    [address] [place], [place] [address]                Office [phone]  Fax [phone]

## 2021-01-29 ENCOUNTER — Ambulatory Visit (INDEPENDENT_AMBULATORY_CARE_PROVIDER_SITE_OTHER): Payer: Medicaid Other | Admitting: Podiatry

## 2021-01-29 ENCOUNTER — Other Ambulatory Visit: Payer: Self-pay

## 2021-01-29 DIAGNOSIS — Z9889 Other specified postprocedural states: Secondary | ICD-10-CM

## 2021-01-29 MED ORDER — OXYCODONE-ACETAMINOPHEN 5-325 MG PO TABS: 1.0000 | ORAL_TABLET | ORAL | 0 refills | Status: DC | PRN

## 2021-01-29 NOTE — Progress Notes (Signed)
   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 DOS: 01/15/2021.  Patient states that she is doing well.  Patient does have some moderate pain to the surgical area.  Overall she states that she has been turning the external fixator one quarter turn 4 times daily.  No new complaints at this time.  She also has been nonweightbearing in the cam boot with the assistance of the knee scooter.  Past Medical History:  Diagnosis Date   Bipolar depression (HCC)    Dizziness    Migraines    Nausea    Rectal bleeding       Objective/Physical Exam Neurovascular status intact.  Skin incisions appear to be well coapted with sutures intact. No sign of infectious process noted. No dehiscence. No active bleeding noted. Moderate edema noted to the surgical extremity.  The external mini rail appear stable and solid with good alignment  Radiographic Exam taken last visit:  Mini rail external fixator and osteotomy site of the fourth metatarsal appear to be stable with routine healing. The mini rail demonstrates good alignment with the fourth metatarsal both lateral paralleling the metatarsal declination angle and AP overlying the fourth metatarsal.  Osteotomy site visualized at the metaphyseal diaphyseal junction of the fourth metatarsal  Assessment: 1. s/p brachymetatarsia surgery LT with application of mini rail external fixator. DOS: 01/15/2021   Plan of Care:  1. Patient was evaluated. X-rays reviewed 2.  Dressings changed today.   3.  Continue strict nonweightbearing using the cam boot and knee scooter 4.  Continue distal distraction of the osteotomy site. 1/4 turn 4x daily with a distraction total of 1 mm/day. 5.  Refill prescription for Percocet 5/325 mg 6.  Return to clinic 1 week for follow-up x-rays  Felecia Shelling, DPM [facility]  Dr. Felecia Shelling, DPM    [address] [place], [place] [address]                Office [phone]  Fax [phone]

## 2021-02-05 ENCOUNTER — Encounter: Payer: Medicaid Other | Admitting: Podiatry

## 2021-02-08 ENCOUNTER — Ambulatory Visit (INDEPENDENT_AMBULATORY_CARE_PROVIDER_SITE_OTHER): Payer: Medicaid Other | Admitting: Podiatry

## 2021-02-08 ENCOUNTER — Other Ambulatory Visit: Payer: Self-pay

## 2021-02-08 ENCOUNTER — Ambulatory Visit (INDEPENDENT_AMBULATORY_CARE_PROVIDER_SITE_OTHER): Payer: Medicaid Other

## 2021-02-08 DIAGNOSIS — S99922A Unspecified injury of left foot, initial encounter: Secondary | ICD-10-CM

## 2021-02-08 NOTE — Progress Notes (Signed)
  Subjective:  Patient ID: Maria Espinoza, female    DOB: 03-Jun-1991,  MRN: [id number]  Chief Complaint  Patient presents with   Toe Injury      pt hit toe on couch over the weekend    DOS: 01/15/2021 Procedure: Brachymetatarsia correction with corticotomy and callus distraction left foot fourth met  30 y.o. female returns for post-op check.  Returns a surgeon visit she missed her appointment last week over the weekend she hit her foot and the wire bent to try to bend it back  Review of Systems: Negative except as noted in the HPI. Denies N/V/F/Ch.   Objective:  There were no vitals filed for this visit. There is no height or weight on file to calculate BMI. Constitutional Well developed. Well nourished.  Vascular Foot warm and well perfused. Capillary refill normal to all digits.   Neurologic Normal speech. Oriented to person, place, and time. Epicritic sensation to light touch grossly present bilaterally.  Dermatologic Central portion with persistent scab  Orthopedic: Tenderness to palpation noted about the surgical site.  Significant edema, fixator does not appear to have any complication.  Wire is bent at the toe   Multiple view plain film radiographs: Approximately 14 mm of distraction at corticotomy site, wire within toe and met head is straight and the portion external is bent Assessment:   1. Toe injury, left, initial encounter    Plan:  Patient was evaluated and treated and all questions answered.  S/p foot surgery left -Discussed with Dr. Amalia Hailey over the phone.  I cut the bit portion of wire and reapplied the wire To the straight portion -Recommend she continue to distract the fixator for another 4 days until Thursday -Return in 1 week for postop visit with Dr. Amalia Hailey  Return in about 8 days (around 02/16/2021) for post op (new x-rays).

## 2021-02-11 ENCOUNTER — Other Ambulatory Visit: Payer: Self-pay

## 2021-02-11 ENCOUNTER — Ambulatory Visit (INDEPENDENT_AMBULATORY_CARE_PROVIDER_SITE_OTHER): Payer: Medicaid Other

## 2021-02-11 ENCOUNTER — Ambulatory Visit (INDEPENDENT_AMBULATORY_CARE_PROVIDER_SITE_OTHER): Payer: Self-pay | Admitting: Podiatry

## 2021-02-11 DIAGNOSIS — S99922A Unspecified injury of left foot, initial encounter: Secondary | ICD-10-CM

## 2021-02-11 DIAGNOSIS — Z9889 Other specified postprocedural states: Secondary | ICD-10-CM

## 2021-02-11 MED ORDER — CIPROFLOXACIN HCL 500 MG PO TABS: 500.0000 mg | ORAL_TABLET | Freq: Two times a day (BID) | ORAL | 0 refills | Status: AC

## 2021-02-11 MED ORDER — DOXYCYCLINE HYCLATE 100 MG PO TABS: 100.0000 mg | ORAL_TABLET | Freq: Two times a day (BID) | ORAL | 0 refills | Status: AC

## 2021-02-12 ENCOUNTER — Encounter: Payer: Medicaid Other | Admitting: Podiatry

## 2021-02-12 ENCOUNTER — Other Ambulatory Visit: Payer: Self-pay | Admitting: Podiatry
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
## 2021-02-13 ENCOUNTER — Telehealth: Payer: Medicaid Other | Admitting: Nurse Practitioner

## 2021-02-13 DIAGNOSIS — R52 Pain, unspecified: Secondary | ICD-10-CM | POA: Diagnosis not present
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
## 2021-02-15 NOTE — Telephone Encounter (Signed)
Please advise 

## 2021-02-16 ENCOUNTER — Other Ambulatory Visit: Payer: Self-pay

## 2021-02-16 ENCOUNTER — Ambulatory Visit: Payer: Medicaid Other

## 2021-02-16 ENCOUNTER — Ambulatory Visit (INDEPENDENT_AMBULATORY_CARE_PROVIDER_SITE_OTHER): Payer: Medicaid Other | Admitting: Podiatry

## 2021-02-16 DIAGNOSIS — Z9889 Other specified postprocedural states: Secondary | ICD-10-CM

## 2021-02-16 MED ORDER — OXYCODONE-ACETAMINOPHEN 5-325 MG PO TABS: 1.0000 | ORAL_TABLET | ORAL | 0 refills | Status: DC | PRN

## 2021-02-16 NOTE — Progress Notes (Signed)
   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 DOS: 01/15/2021.  Patient states that she is doing well.  Patient does have some moderate pain to the surgical area.  Overall she states that she has been turning the external fixator one quarter turn 4 times daily.  No new complaints at this time.  She also has been nonweightbearing in the cam boot with the assistance of the knee scooter.  Past Medical History:  Diagnosis Date   Bipolar depression (HCC)    Dizziness    Migraines    Nausea    Rectal bleeding       Objective/Physical Exam Neurovascular status intact.  Skin incisions appear to be well coapted with sutures intact. No sign of infectious process noted. No dehiscence. No active bleeding noted. Moderate edema noted to the surgical extremity.  The external mini rail appear stable and solid with good alignment  Radiographic Exam taken last visit:  Mini rail external fixator and osteotomy site of the fourth metatarsal appear to be stable with routine healing. The mini rail demonstrates good alignment with the fourth metatarsal both lateral paralleling the metatarsal declination angle and AP overlying the fourth metatarsal.  Osteotomy site visualized at the metaphyseal diaphyseal junction of the fourth metatarsal  Assessment: 1. s/p brachymetatarsia surgery LT with application of mini rail external fixator. DOS: 01/15/2021   Plan of Care:  1. Patient was evaluated. X-rays reviewed that were taken 02/11/2021 2.  Dressings changed today.   3.  Continue strict nonweightbearing using the cam boot and knee scooter 4.  Patient is done distracting.  The toe is out to length and within the normal parabola of the foot  5.  Refill prescription for Percocet 5/325 mg 6.  x6 additional weeks with the external mini rail fixator intact 7.  Return to clinic in 3 weeks for follow-up x-ray  and to arrange surgical outpatient removal of external fixator at the surgery center  Felecia Shelling, DPM [facility]  Dr. Felecia Shelling, DPM    [address] [place], [place] [address]                Office [phone]  Fax [phone]

## 2021-02-25 ENCOUNTER — Other Ambulatory Visit: Payer: Self-pay

## 2021-02-25 ENCOUNTER — Encounter: Payer: Self-pay | Admitting: Podiatry

## 2021-02-25 ENCOUNTER — Ambulatory Visit (INDEPENDENT_AMBULATORY_CARE_PROVIDER_SITE_OTHER): Payer: Medicaid Other

## 2021-02-25 ENCOUNTER — Ambulatory Visit (INDEPENDENT_AMBULATORY_CARE_PROVIDER_SITE_OTHER): Payer: Medicaid Other | Admitting: Podiatry

## 2021-02-25 VITALS — Temp 98.9°F

## 2021-02-25 DIAGNOSIS — Z9889 Other specified postprocedural states: Secondary | ICD-10-CM

## 2021-02-25 DIAGNOSIS — Q72899 Other reduction defects of unspecified lower limb: Secondary | ICD-10-CM

## 2021-02-25 DIAGNOSIS — M216X1 Other acquired deformities of right foot: Secondary | ICD-10-CM

## 2021-02-25 NOTE — Progress Notes (Signed)
   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 DOS: 01/15/2021.  Patient states that she does not recall how the pain came out.  The pain came out today.  She seems to be doing okay.  She states she has some pain and swelling.  She denies any other acute complaints  Past Medical History:  Diagnosis Date   Bipolar depression (HCC)    Dizziness    Migraines    Nausea    Rectal bleeding       Objective/Physical Exam Neurovascular status intact.  Skin incisions appear to be well coapted with sutures intact. No sign of infectious process noted. No dehiscence. No active bleeding noted. Moderate edema noted to the surgical extremity.  The external mini rail appear stable and solid with good alignment  Radiographic Exam taken last visit:  Mini rail external fixator and osteotomy site of the fourth metatarsal appear to be stable with routine healing. The mini rail demonstrates good alignment with the fourth metatarsal both lateral paralleling the metatarsal declination angle and AP overlying the fourth metatarsal.  Except for the second toe proximal nail which does not appear to be within the strap of the fixator.  Osteotomy site visualized at the metaphyseal diaphyseal junction of the fourth metatarsal  Assessment: 1. s/p brachymetatarsia surgery LT with application of mini rail external fixator. DOS: 01/15/2021   Plan of Care:  1. Patient was evaluated. X-rays reviewed that were taken 02/25/2021 2.  Dressings changed today.   3.  Continue strict nonweightbearing using the cam boot and knee scooter 4.  Patient is done distracting.  The toe is out to length and within the normal parabola of the foot  5.  Continue prescription for Percocet 5/325 mg 6.  x6 additional weeks with the external mini rail fixator intact 7.  Return to clinic in tomorrow with Dr. Logan Bores to reassess and plan for  future treatment plan

## 2021-02-26 ENCOUNTER — Ambulatory Visit (INDEPENDENT_AMBULATORY_CARE_PROVIDER_SITE_OTHER): Payer: Medicaid Other

## 2021-02-26 ENCOUNTER — Ambulatory Visit (INDEPENDENT_AMBULATORY_CARE_PROVIDER_SITE_OTHER): Payer: Medicaid Other | Admitting: Podiatry

## 2021-02-26 DIAGNOSIS — Z9889 Other specified postprocedural states: Secondary | ICD-10-CM

## 2021-02-26 DIAGNOSIS — M216X2 Other acquired deformities of left foot: Secondary | ICD-10-CM

## 2021-02-26 NOTE — Progress Notes (Signed)
   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 DOS: 01/15/2021.  Yesterday, 02/25/2021 the patient came into the office and was seen by Dr. Allena Katz for evaluation of movement and possibly bumping of the mini rail external fixator which caused it to detach from the proximal pins.  Recommended that she come into the office today to be evaluated and treated.  Past Medical History:  Diagnosis Date   Bipolar depression (HCC)    Dizziness    Migraines    Nausea    Rectal bleeding       Objective/Physical Exam Neurovascular status intact.  Skin incisions appear to be well coapted with the holes of the percutaneous pins appear to be stable with minimal drainage. No sign of infectious process noted. No dehiscence. No active bleeding noted. Moderate edema noted to the surgical extremity.  The external mini rail has detached from the proximal pans of the metatarsal  Radiographic Exam taken last visit:  Mini rail external fixator and osteotomy site of the fourth metatarsal appear to be stable with routine healing. The mini rail demonstrates decent alignment with the fourth metatarsal both lateral paralleling the metatarsal declination angle and AP overlying the fourth metatarsal.  There is a change of the alignment of the fourth metatarsal based on prior x-rays taken.  Unfortunately there must have been some sort of trauma or stubbing of the external fixator causing movement.  Overall it is in somewhat decent alignment however so we will continue with external fixator.  Assessment: 1. s/p brachymetatarsia surgery LT with application of mini rail external fixator. DOS: 01/15/2021    Plan of Care:  1. Patient was evaluated.  Remaining sutures removed.  X-ray reviewed today 2.  The external fixator was adjusted and re-secured to the percutaneous pins along the metatarsal.  It was tightened and  secured as rectus is possible 3.  The percutaneous fixation pin to the distal tip of the toe was removed at this time. 4.  It does appear that the toe has shortened a few millimeters.  Currently the distraction of the external fixator is a 18 mm.  Recommend that the patient resume distraction of the external fixator one quarter turn 4 times per day until the fourth toe has distracted to an appropriate length adjacent to the fifth and third toes. 5.  Continue strict nonweightbearing  6.  Return to clinic in 2 weeks  Felecia Shelling, DPM [facility]  Dr. Felecia Shelling, DPM    [address] [place], [place] [address]                Office [phone]  Fax [phone]

## 2021-03-01 ENCOUNTER — Other Ambulatory Visit: Payer: Self-pay | Admitting: Podiatry

## 2021-03-01 ENCOUNTER — Telehealth: Payer: Self-pay | Admitting: Podiatry

## 2021-03-01 MED ORDER — OXYCODONE-ACETAMINOPHEN 5-325 MG PO TABS: 1.0000 | ORAL_TABLET | ORAL | 0 refills | Status: DC | PRN

## 2021-03-01 NOTE — Telephone Encounter (Signed)
Sent. - Dr. Kynsli Haapala

## 2021-03-01 NOTE — Telephone Encounter (Signed)
Patient called stating she needs a refill of her pain medication. Pharmacy is [facility] [address] Pts number is [phone].

## 2021-03-01 NOTE — Progress Notes (Signed)
PRN postop pain 

## 2021-03-09 ENCOUNTER — Other Ambulatory Visit: Payer: Self-pay

## 2021-03-09 ENCOUNTER — Ambulatory Visit (INDEPENDENT_AMBULATORY_CARE_PROVIDER_SITE_OTHER): Payer: Medicaid Other

## 2021-03-09 ENCOUNTER — Ambulatory Visit (INDEPENDENT_AMBULATORY_CARE_PROVIDER_SITE_OTHER): Payer: Medicaid Other | Admitting: Podiatry

## 2021-03-09 DIAGNOSIS — Z9889 Other specified postprocedural states: Secondary | ICD-10-CM | POA: Diagnosis not present

## 2021-03-09 DIAGNOSIS — M21542 Acquired clubfoot, left foot: Secondary | ICD-10-CM | POA: Diagnosis not present

## 2021-03-09 MED ORDER — OXYCODONE-ACETAMINOPHEN 5-325 MG PO TABS: 1.0000 | ORAL_TABLET | Freq: Three times a day (TID) | ORAL | 0 refills | Status: DC | PRN

## 2021-03-09 NOTE — Progress Notes (Signed)
   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 DOS: 01/15/2021.  On 02/25/2021 the patient came into the office and was seen by Dr. Allena Katz for evaluation of movement and possibly bumping of the mini rail external fixator which caused it to detach from the proximal pins.   Patient states that she is doing very well.  She distracted the toe out to length and discontinue the distraction when it reached the parabola of the digits.  She is doing very well with moderate pain here and there.  No new complaints at this time.  Overall she is very satisfied  Past Medical History:  Diagnosis Date   Bipolar depression (HCC)    Dizziness    Migraines    Nausea    Rectal bleeding       Objective/Physical Exam Neurovascular status intact.  Skin incisions appear to be well coapted with the holes of the percutaneous pins appear to be stable with minimal drainage. No sign of infectious process noted. No dehiscence. No active bleeding noted. Moderate edema noted to the surgical extremity.   Radiographic Exam:  External fixator intact.  There does appear to be some angular change in deviation of the fourth metatarsal osteotomy site and distal portion of the metatarsal however clinically the toe was distracted nicely.  There is some evidence of callus formation along the distraction site.  Assessment: 1. s/p brachymetatarsia surgery LT with application of mini rail external fixator. DOS: 01/15/2021    Plan of Care:  1. Patient was evaluated.  2.  Today I explained to the patient that although there has been some angular deviation of the metatarsal distraction due to the loosening of the mini rail external fixator, clinically the foot looks well clinically with good distraction of the fourth toe.  We will leave the mini rail external fixator in place for an additional 6 weeks and continue  nonweightbearing. 3.  Continue nonweightbearing x6 weeks with a knee scooter and cam boot 4.  Return to clinic in 6 weeks for follow-up x-rays and to initiate weightbearing for 1 month with the external fixator in place and to schedule removal of the external fixator   Felecia Shelling, DPM [facility]  Dr. Felecia Shelling, DPM    [address] [place], [place] [address]                Office [phone]  Fax [phone]

## 2021-03-25 ENCOUNTER — Telehealth: Payer: Self-pay | Admitting: Podiatry

## 2021-03-25 NOTE — Telephone Encounter (Signed)
Patient called she needs a refill on her pain medication. Pt uses [facility]

## 2021-04-06 ENCOUNTER — Other Ambulatory Visit: Payer: Self-pay | Admitting: Podiatry

## 2021-04-06 ENCOUNTER — Telehealth: Payer: Self-pay | Admitting: Podiatry

## 2021-04-06 MED ORDER — OXYCODONE-ACETAMINOPHEN 5-325 MG PO TABS: 1.0000 | ORAL_TABLET | Freq: Three times a day (TID) | ORAL | 0 refills | Status: DC | PRN

## 2021-04-06 NOTE — Telephone Encounter (Signed)
Patient called this am upset stating the MD never called in her pain medication. Patient states she has an Hospital doctor and she will get it removed. Patient wants the office manager and Dr to call her. Relayed message to office manager.

## 2021-04-06 NOTE — Telephone Encounter (Signed)
I attempted to return her call.  I left her a message to call me back.  I informed her that Dr. Logan Bores sent her prescription to her pharmacy.

## 2021-04-06 NOTE — Progress Notes (Signed)
PRN postop pain 

## 2021-04-06 NOTE — Telephone Encounter (Signed)
I am returning your call.  "I was calling because I was in pain for two weeks without any pain medication.  I have been calling and calling.  I've left messages.  I've been taking BC powder trying to control the pain the best I could.  I need my medicine sent to my pharmacy!  Something has to give!  I want these rods out of my foot.  I take care of three kids and my foot is killing me!"  I am so sorry.  We apologize.  I only see one message where you called.  Dr. Logan Bores was out of the office at that time.  The message was sent to another physician but apparently, he did not respond.  We are so sorry.  Dr. Logan Bores sent the prescription to [facility] on [address] this morning.  He said he will take the fixator off on 04/20/2021, your next scheduled appointment.  "Okay, that sounds good."

## 2021-04-14 ENCOUNTER — Encounter: Payer: Self-pay | Admitting: Internal Medicine

## 2021-04-20 ENCOUNTER — Other Ambulatory Visit: Payer: Self-pay

## 2021-04-20 ENCOUNTER — Ambulatory Visit (INDEPENDENT_AMBULATORY_CARE_PROVIDER_SITE_OTHER): Payer: Medicaid Other | Admitting: Podiatry

## 2021-04-20 ENCOUNTER — Ambulatory Visit (INDEPENDENT_AMBULATORY_CARE_PROVIDER_SITE_OTHER): Payer: Medicaid Other

## 2021-04-20 DIAGNOSIS — Q72899 Other reduction defects of unspecified lower limb: Secondary | ICD-10-CM

## 2021-04-20 DIAGNOSIS — M216X2 Other acquired deformities of left foot: Secondary | ICD-10-CM

## 2021-04-20 DIAGNOSIS — Z9889 Other specified postprocedural states: Secondary | ICD-10-CM

## 2021-04-20 NOTE — Progress Notes (Signed)
   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 DOS: 01/15/2021.  Patient states that overall she is doing very well.  She is kept the external fixator intact.  Unfortunately along the postoperative recovery she did sustain a fall injury and loosening of the mini rail external fixator occurred which did alter the postoperative course however she is very satisfied with the distraction of the toe and overall improvement of the appearance of the foot.  Past Medical History:  Diagnosis Date   Bipolar depression (HCC)    Dizziness    Migraines    Nausea    Rectal bleeding       Objective/Physical Exam Neurovascular status intact.  Skin incisions appear to be well coapted with the holes of the percutaneous pins appear to be stable with no drainage.  Currently there is no indication of infection.  The fourth toe is actually in good rectus alignment and only slightly shorter than the adjacent digits.  Clinically the foot appears good with good distraction of the fourth toe.  Radiographic Exam:  External fixator intact.  There does appear to be some angular change in deviation of the fourth metatarsal osteotomy site and distal portion of the metatarsal however clinically the toe was distracted nicely although the toe appears to be somewhat shorter in relationship to the adjacent digits.  There is some evidence of callus formation along the distraction site.  Degenerative changes noted of the fourth MTP joint.  Assessment: 1. s/p brachymetatarsia surgery LT with application of mini rail external fixator. DOS: 01/15/2021    Plan of Care:  1. Patient was evaluated.  2.  Today authorization for surgery was initiated today to remove the external fixator.  Surgery will consist of removal of external fixation left foot.  This will be performed under sedation at the surgery center.  All patient  questions were answered.  After removal of external fixator the patient will be weightbearing as tolerated in a cam boot x4 additional weeks. 3.  At the first postoperative visit we will initiate authorization to proceed with correction of the right foot.  Return to clinic 1 week postop  Felecia Shelling, DPM [facility]  Dr. Felecia Shelling, DPM    [address] [place], [place] [address]                Office [phone]  Fax [phone]

## 2021-04-21 ENCOUNTER — Other Ambulatory Visit: Payer: Self-pay | Admitting: Podiatry

## 2021-04-21 ENCOUNTER — Telehealth: Payer: Self-pay | Admitting: Podiatry

## 2021-04-21 ENCOUNTER — Telehealth: Payer: Self-pay | Admitting: *Deleted

## 2021-04-21 MED ORDER — OXYCODONE-ACETAMINOPHEN 5-325 MG PO TABS: 1.0000 | ORAL_TABLET | Freq: Three times a day (TID) | ORAL | 0 refills | Status: DC | PRN

## 2021-04-21 NOTE — Telephone Encounter (Signed)
I'm calling you back as promised.  Your prescription was sent to the pharmacy.  "Yes, the nurse called me back."  You can come by the office to get surgery kit.  We had ran out of blue folders so they're sending some from [place].  "I'll come by there tomorrow to pick that up.  Thanks for calling me back."

## 2021-04-21 NOTE — Telephone Encounter (Signed)
Rx sent. - Dr. Avy Barlett

## 2021-04-21 NOTE — Telephone Encounter (Signed)
She call and ask for a refill on pain medication

## 2021-05-04 ENCOUNTER — Ambulatory Visit: Payer: Medicaid Other

## 2021-05-07 ENCOUNTER — Ambulatory Visit: Payer: Medicaid Other | Admitting: Physician Assistant

## 2021-05-07 ENCOUNTER — Encounter: Payer: Self-pay | Admitting: Physician Assistant

## 2021-05-07 ENCOUNTER — Other Ambulatory Visit: Payer: Self-pay

## 2021-05-07 ENCOUNTER — Encounter
Admission: RE | Admit: 2021-05-07 | Discharge: 2021-05-07 | Disposition: A | Discharge status: Home / Self Care | Payer: Medicaid Other | Source: Ambulatory Visit | Attending: Podiatry | Admitting: Podiatry

## 2021-05-07 DIAGNOSIS — Z113 Encounter for screening for infections with a predominantly sexual mode of transmission: Secondary | ICD-10-CM | POA: Diagnosis not present

## 2021-05-07 LAB — WET PREP FOR TRICH, YEAST, CLUE
Trichomonas Exam: NEGATIVE
Yeast Exam: NEGATIVE

## 2021-05-07 NOTE — Progress Notes (Signed)
[facility] Department STI clinic/screening visit  Subjective:  Maria Espinoza is a 30 y.o. female being seen today for an STI screening visit. The patient reports they do have symptoms.  Patient reports that they do not desire a pregnancy in the next year.   They reported they are not interested in discussing contraception today.  Patient's last menstrual period was 05/03/2021 (exact date).   Patient has the following medical conditions:   Patient Active Problem List   Diagnosis Date Noted   Establishing care with new doctor, encounter for 01/14/2021   Social anxiety disorder 10/03/2018   Severe recurrent major depression without psychotic features (HCC) 10/03/2018   Intentional doxepin overdose (HCC) 10/01/2018   Bipolar disorder, unspecified (HCC) 07/24/2018   Depression with anxiety 04/25/2018   Migraines 04/25/2018   Morbid obesity with BMI of 50.0-59.9, adult (HCC) 04/25/2018    Chief Complaint  Patient presents with   SEXUALLY TRANSMITTED DISEASE    screening    HPI  Patient reports that she had some discharge with an unusual, chemical odor the day before her period started but once her period started, the odor resolved.  Would like a screening today.  Reports recent left foot surgery and that she is scheduled for a surgery on the right foot in a few weeks.  Reports that she is taking medicines as directed (Vraylar, Zoloft, Trazodone).  LMP 05/03/2021 and is currently abstinent as her BCM.  Last HIV test and pap were in May of this year.  See flowsheet for further details and programmatic requirements.    The following portions of the patient's history were reviewed and updated as appropriate: allergies, current medications, past medical history, past social history, past surgical history and problem list.  Objective:  There were no vitals filed for this visit.  Physical Exam Constitutional:      General: She is not in acute distress.    Appearance: Normal  appearance.  HENT:     Head: Normocephalic and atraumatic.     Comments: No nits,lice, or hair loss. No cervical, supraclavicular or axillary adenopathy.     Mouth/Throat:     Mouth: Mucous membranes are moist.     Pharynx: Oropharynx is clear. No oropharyngeal exudate or posterior oropharyngeal erythema.  Eyes:     Conjunctiva/sclera: Conjunctivae normal.  Pulmonary:     Effort: Pulmonary effort is normal.  Abdominal:     Palpations: Abdomen is soft. There is no mass.     Tenderness: There is no abdominal tenderness. There is no guarding or rebound.  Genitourinary:    General: Normal vulva.     Rectum: Normal.     Comments: External genitalia/pubic area without nits, lice, edema, erythema, lesions and inguinal adenopathy. Vagina with normal mucosa and small amount of menstrual blood present. Cervix without visible lesions. Uterus firm, mobile, nt, no masses, no CMT, no adnexal tenderness or fullness.  Musculoskeletal:     Cervical back: Neck supple. No tenderness.  Skin:    General: Skin is warm and dry.     Findings: No bruising, erythema, lesion or rash.  Neurological:     Mental Status: She is alert and oriented to person, place, and time.  Psychiatric:        Mood and Affect: Mood normal.        Behavior: Behavior normal.        Thought Content: Thought content normal.        Judgment: Judgment normal.     Assessment  and Plan:  Maria Espinoza is a 30 y.o. female presenting to the [facility] Department for STI screening  1. Screening for STD (sexually transmitted disease) Patient into clinic without current symptoms. Reviewed with patient wet mount results and that no treatment is indicated today. Rec condoms with all sex. Await test results.  Counseled that RN will call if needs to RTC for treatment once results are back.  - WET PREP FOR TRICH, YEAST, CLUE - Chlamydia/Gonorrhea [facility] - HIV [facility] - Syphilis Serology, [facility]     No follow-ups on file.  Future Appointments  Date Time Provider Department Center  05/11/2021 10:45 AM Corky Downs, MD [facility] None  05/21/2021  9:45 AM Felecia Shelling, DPM [facility]  05/28/2021  9:45 AM Felecia Shelling, DPM [facility]  06/15/2021  2:15 PM Felecia Shelling, DPM [facility]    Matt Holmes, [place]

## 2021-05-07 NOTE — Patient Instructions (Addendum)
Your procedure is scheduled on:05-14-21 Friday Report to the Registration Desk on the 1st floor of the Medical Mall.Then proceed to the 2nd floor Surgery Desk in the Medical Mall To find out your arrival time, please call [phone] between 1PM - 3PM on:05-13-21 Thursday  REMEMBER: Instructions that are not followed completely may result in serious medical risk, up to and including death; or upon the discretion of your surgeon and anesthesiologist your surgery may need to be rescheduled.  Do not eat food after midnight the night before surgery.  No gum chewing, lozengers or hard candies.  You may however, drink CLEAR liquids up to 2 hours before you are scheduled to arrive for your surgery. Do not drink anything within 2 hours of your scheduled arrival time.  Clear liquids include: - water  - apple juice without pulp - gatorade (not RED, PURPLE, OR BLUE) - black coffee or tea (Do NOT add milk or creamers to the coffee or tea) Do NOT drink anything that is not on this list.  TAKE THESE MEDICATIONS THE MORNING OF SURGERY WITH A SIP OF WATER: -sertraline (ZOLOFT) 100 MG tablet -VRAYLAR 1.5 MG capsule -you may take oxyCODONE-acetaminophen (PERCOCET) 5-325 MG tablet if needed the day of surgery  One week prior to surgery: Stop Anti-inflammatories (NSAIDS) such as Advil, Aleve, Ibuprofen, Motrin, Naproxen, Naprosyn and Aspirin based products such as Excedrin, Goodys Powder, BC Powder.You may however, continue to take Tylenol if needed for pain up until the day of surgery.  Stop ANY OVER THE COUNTER supplements/vitamins NOW (05-07-21) until after surgery (Multiple Vitamin (MULTIVITAMIN WITH MINERALS) TABS tablet)  No Alcohol for 24 hours before or after surgery.  No Smoking including e-cigarettes for 24 hours prior to surgery.  No chewable tobacco products for at least 6 hours prior to surgery.  No nicotine patches on the day of surgery.  Do not use any "recreational" drugs for at  least a week prior to your surgery.  Please be advised that the combination of cocaine and anesthesia may have negative outcomes, up to and including death. If you test positive for cocaine, your surgery will be cancelled.  On the morning of surgery brush your teeth with toothpaste and water, you may rinse your mouth with mouthwash if you wish. Do not swallow any toothpaste or mouthwash.  Use CHG Soap or wipes as directed on instruction sheet.  Do not wear jewelry, make-up, hairpins, clips or nail polish.  Do not wear lotions, powders, or perfumes.   Do not shave body from the neck down 48 hours prior to surgery just in case you cut yourself which could leave a site for infection.  Also, freshly shaved skin may become irritated if using the CHG soap.  Contact lenses, hearing aids and dentures may not be worn into surgery.  Do not bring valuables to the hospital. [facility] is not responsible for any missing/lost belongings or valuables.  Notify your doctor if there is any change in your medical condition (cold, fever, infection).  Wear comfortable clothing (specific to your surgery type) to the hospital.  After surgery, you can help prevent lung complications by doing breathing exercises.  Take deep breaths and cough every 1-2 hours. Your doctor may order a device called an Incentive Spirometer to help you take deep breaths. When coughing or sneezing, hold a pillow firmly against your incision with both hands. This is called "splinting." Doing this helps protect your incision. It also decreases belly discomfort.  If you are being  admitted to the hospital overnight, leave your suitcase in the car. After surgery it may be brought to your room.  If you are being discharged the day of surgery, you will not be allowed to drive home. You will need a responsible adult (18 years or older) to drive you home and stay with you that night.   If you are taking public transportation, you will  need to have a responsible adult (18 years or older) with you. Please confirm with your physician that it is acceptable to use public transportation.   Please call the Pre-admissions Testing Dept. at [phone] if you have any questions about these instructions.  Surgery Visitation Policy:  Patients undergoing a surgery or procedure may have one family member or support person with them as long as that person is not COVID-19 positive or experiencing its symptoms.  That person may remain in the waiting area during the procedure and may rotate out with other people.  Inpatient Visitation:    Visiting hours are 7 a.m. to 8 p.m. Up to two visitors ages 16+ are allowed at one time in a patient room. The visitors may rotate out with other people during the day. Visitors must check out when they leave, or other visitors will not be allowed. One designated support person may remain overnight. The visitor must pass COVID-19 screenings, use hand sanitizer when entering and exiting the patient's room and wear a mask at all times, including in the patient's room. Patients must also wear a mask when staff or their visitor are in the room. Masking is required regardless of vaccination status.

## 2021-05-10 ENCOUNTER — Other Ambulatory Visit: Payer: Self-pay

## 2021-05-10 ENCOUNTER — Other Ambulatory Visit: Payer: Self-pay | Admitting: Podiatry

## 2021-05-10 ENCOUNTER — Telehealth: Payer: Self-pay | Admitting: *Deleted

## 2021-05-10 ENCOUNTER — Telehealth: Payer: Self-pay | Admitting: Podiatry

## 2021-05-10 ENCOUNTER — Inpatient Hospital Stay: Admission: RE | Admit: 2021-05-10 | Payer: Medicaid Other | Source: Ambulatory Visit

## 2021-05-10 ENCOUNTER — Encounter
Admission: RE | Admit: 2021-05-10 | Discharge: 2021-05-10 | Disposition: A | Discharge status: Home / Self Care | Payer: Medicaid Other | Source: Ambulatory Visit | Attending: Podiatry | Admitting: Podiatry

## 2021-05-10 HISTORY — DX: Failed or difficult intubation, initial encounter: T88.4XXA

## 2021-05-10 MED ORDER — OXYCODONE-ACETAMINOPHEN 5-325 MG PO TABS: 1.0000 | ORAL_TABLET | Freq: Three times a day (TID) | ORAL | 0 refills | Status: DC | PRN

## 2021-05-10 NOTE — Telephone Encounter (Signed)
"  Please ask Dr. Logan Bores to put his orders in Epic for Maria Espinoza's surgery that's scheduled for Friday."

## 2021-05-10 NOTE — Patient Instructions (Signed)
Your procedure is scheduled on:05-14-21 Friday Report to the Registration Desk on the 1st floor of the Medical Mall.Then proceed to the 2nd floor Surgery Desk in the Medical Mall To find out your arrival time, please call [phone] between 1PM - 3PM on:05-13-21 Thursday  REMEMBER: Instructions that are not followed completely may result in serious medical risk, up to and including death; or upon the discretion of your surgeon and anesthesiologist your surgery may need to be rescheduled.  Do not eat food after midnight the night before surgery.  No gum chewing, lozengers or hard candies.  You may however, drink CLEAR liquids up to 2 hours before you are scheduled to arrive for your surgery. Do not drink anything within 2 hours of your scheduled arrival time.  Clear liquids include: - water  - apple juice without pulp - gatorade (not RED, PURPLE, OR BLUE) - black coffee or tea (Do NOT add milk or creamers to the coffee or tea) Do NOT drink anything that is not on this list.  TAKE THESE MEDICATIONS THE MORNING OF SURGERY WITH A SIP OF WATER: -VRAYLAR 1.5 MG capsule -sertraline (ZOLOFT) 100 MG tablet -You may take oxyCODONE-acetaminophen (PERCOCET) 5-325 MG tablet if needed for pain  One week prior to surgery: Stop Anti-inflammatories (NSAIDS) such as Advil, Aleve, Ibuprofen, Motrin, Naproxen, Naprosyn and Aspirin based products such as Excedrin, Goodys Powder, BC Powder.You may however, continue to take Tylenol if needed for pain up until the day of surgery.  Stop ANY OVER THE COUNTER supplements/vitamins NOW (05-10-21) until after surgery.  No Alcohol for 24 hours before or after surgery.  No Smoking including e-cigarettes for 24 hours prior to surgery.  No chewable tobacco products for at least 6 hours prior to surgery.  No nicotine patches on the day of surgery.  Do not use any "recreational" drugs for at least a week prior to your surgery.  Please be advised that the  combination of cocaine and anesthesia may have negative outcomes, up to and including death. If you test positive for cocaine, your surgery will be cancelled.  On the morning of surgery brush your teeth with toothpaste and water, you may rinse your mouth with mouthwash if you wish. Do not swallow any toothpaste or mouthwash.  Use CHG Soap as directed on instruction sheet.  Do not wear jewelry, make-up, hairpins, clips or nail polish.  Do not wear lotions, powders, or perfumes.   Do not shave body from the neck down 48 hours prior to surgery just in case you cut yourself which could leave a site for infection.  Also, freshly shaved skin may become irritated if using the CHG soap.  Contact lenses, hearing aids and dentures may not be worn into surgery.  Do not bring valuables to the hospital. [facility] is not responsible for any missing/lost belongings or valuables.   Notify your doctor if there is any change in your medical condition (cold, fever, infection).  Wear comfortable clothing (specific to your surgery type) to the hospital.  After surgery, you can help prevent lung complications by doing breathing exercises.  Take deep breaths and cough every 1-2 hours. Your doctor may order a device called an Incentive Spirometer to help you take deep breaths. When coughing or sneezing, hold a pillow firmly against your incision with both hands. This is called "splinting." Doing this helps protect your incision. It also decreases belly discomfort.  If you are being admitted to the hospital overnight, leave your suitcase in the  car. After surgery it may be brought to your room.  If you are being discharged the day of surgery, you will not be allowed to drive home. You will need a responsible adult (18 years or older) to drive you home and stay with you that night.   If you are taking public transportation, you will need to have a responsible adult (18 years or older) with you. Please  confirm with your physician that it is acceptable to use public transportation.   Please call the Pre-admissions Testing Dept. at [phone] if you have any questions about these instructions.  Surgery Visitation Policy:  Patients undergoing a surgery or procedure may have one family member or support person with them as long as that person is not COVID-19 positive or experiencing its symptoms.  That person may remain in the waiting area during the procedure and may rotate out with other people.  Inpatient Visitation:    Visiting hours are 7 a.m. to 8 p.m. Up to two visitors ages 16+ are allowed at one time in a patient room. The visitors may rotate out with other people during the day. Visitors must check out when they leave, or other visitors will not be allowed. One designated support person may remain overnight. The visitor must pass COVID-19 screenings, use hand sanitizer when entering and exiting the patient's room and wear a mask at all times, including in the patient's room. Patients must also wear a mask when staff or their visitor are in the room. Masking is required regardless of vaccination status.

## 2021-05-10 NOTE — Telephone Encounter (Signed)
She call and ask for Reill on pain medication

## 2021-05-10 NOTE — Telephone Encounter (Signed)
Prescription sent.  Thanks, Dr. Kourtnee Lahey

## 2021-05-10 NOTE — Telephone Encounter (Signed)
Orders placed.  Thanks, Dr. Logan Bores

## 2021-05-11 ENCOUNTER — Ambulatory Visit (INDEPENDENT_AMBULATORY_CARE_PROVIDER_SITE_OTHER): Payer: Medicaid Other | Admitting: Internal Medicine

## 2021-05-11 ENCOUNTER — Encounter: Payer: Self-pay | Admitting: Internal Medicine

## 2021-05-11 VITALS — BP 121/86 | HR 70 | Ht 63.0 in | Wt 302.3 lb

## 2021-05-11 DIAGNOSIS — F4010 Social phobia, unspecified: Secondary | ICD-10-CM

## 2021-05-11 DIAGNOSIS — G43C0 Periodic headache syndromes in child or adult, not intractable: Secondary | ICD-10-CM

## 2021-05-11 DIAGNOSIS — Z6841 Body Mass Index (BMI) 40.0 and over, adult: Secondary | ICD-10-CM

## 2021-05-11 DIAGNOSIS — F332 Major depressive disorder, recurrent severe without psychotic features: Secondary | ICD-10-CM

## 2021-05-11 DIAGNOSIS — F3177 Bipolar disorder, in partial remission, most recent episode mixed: Secondary | ICD-10-CM

## 2021-05-11 NOTE — Assessment & Plan Note (Signed)
Under psych care

## 2021-05-11 NOTE — H&P (View-Only) (Signed)
New Patient Office Visit  Subjective:  Patient ID: Maria Espinoza, female    DOB: 25-Nov-1990  Age: 30 y.o. MRN: [id number]  CC:  Chief Complaint  Patient presents with   Annual Exam    Patient needs a physical exam for surgery 05/14/2021    HPI Patient presents for check up, patient is going to have left foot surgery for removal of fixator.  She denies any chest pain shortness of breath paroxysmal nocturnal dyspnea orthopnea swelling of the legs there is no history of passing out spell.  She smokes half a pack per day.  she does not drink.  Past Medical History:  Diagnosis Date   Bipolar depression (HCC)    Difficult intubation    Dizziness    Migraines    Nausea    Rectal bleeding      Current Outpatient Medications:    ibuprofen (ADVIL) 200 MG tablet, Take 400 mg by mouth every 6 (six) hours as needed., Disp: , Rfl:    Multiple Vitamin (MULTIVITAMIN WITH MINERALS) TABS tablet, Take 1 tablet by mouth daily. (Patient not taking: Reported on 05/10/2021), Disp: , Rfl:    oxyCODONE-acetaminophen (PERCOCET) 5-325 MG tablet, Take 1 tablet by mouth every 8 (eight) hours as needed for severe pain., Disp: 20 tablet, Rfl: 0   sertraline (ZOLOFT) 100 MG tablet, Take 100 mg by mouth every morning., Disp: , Rfl:    terbinafine (LAMISIL) 250 MG tablet, Take 1 tablet (250 mg total) by mouth daily. (Patient taking differently: Take 250 mg by mouth every morning.), Disp: 90 tablet, Rfl: 0   traZODone (DESYREL) 150 MG tablet, Take 150 mg by mouth as needed., Disp: , Rfl:    VRAYLAR 1.5 MG capsule, Take 1.5 mg by mouth every morning., Disp: , Rfl:    Past Surgical History:  Procedure Laterality Date   METATARSAL OSTEOTOMY Left 01/15/2021   Procedure: METATARSAL OSTEOTOMY;  Surgeon: Felecia Shelling, DPM;  Location: [facility] ORS;  Service: Podiatry;  Laterality: Left;   TONSILLECTOMY     age 44    Family History  Problem Relation Age of Onset   Migraines Father    Heart disease Maternal  Grandmother    Depression Maternal Grandmother    Hypertension Maternal Grandmother    Breast cancer Paternal Grandfather     Social History   Socioeconomic History   Marital status: Single    Spouse name: Not on file   Number of children: Not on file   Years of education: Not on file   Highest education level: Not on file  Occupational History   Not on file  Tobacco Use   Smoking status: Some Days    Packs/day: 0.50    Years: 3.00    Pack years: 1.50    Types: Cigarettes   Smokeless tobacco: Never  Vaping Use   Vaping Use: Never used  Substance and Sexual Activity   Alcohol use: Not Currently    Comment: occasional   Drug use: Never   Sexual activity: Not Currently    Partners: Male    Birth control/protection: Abstinence  Other Topics Concern   Not on file  Social History Narrative   Not on file   Social Determinants of Health   Financial Resource Strain: Not on file  Food Insecurity: Not on file  Transportation Needs: Not on file  Physical Activity: Not on file  Stress: Not on file  Social Connections: Not on file  Intimate Partner Violence: Not At Risk  Fear of Current or Ex-Partner: No   Emotionally Abused: No   Physically Abused: No   Sexually Abused: No    ROS Review of Systems  Constitutional: Negative.   HENT: Negative.    Eyes: Negative.   Respiratory: Negative.    Cardiovascular: Negative.   Gastrointestinal: Negative.   Endocrine: Negative.   Genitourinary: Negative.   Musculoskeletal: Negative.   Skin: Negative.   Allergic/Immunologic: Negative.   Neurological: Negative.   Hematological: Negative.   Psychiatric/Behavioral: Negative.    All other systems reviewed and are negative.  Objective:   Today's Vitals: BP 121/86   Pulse 70   Ht 5\' 3"  (1.6 m)   Wt (!) 302 lb 4.8 oz (137.1 kg)   LMP 05/03/2021 (Exact Date)   BMI 53.55 kg/m   Physical Exam Vitals reviewed.  Constitutional:      Appearance: She is obese.  HENT:      Head: Normocephalic.     Nose: Nose normal.  Eyes:     Pupils: Pupils are equal, round, and reactive to light.  Cardiovascular:     Rate and Rhythm: Normal rate and regular rhythm.     Heart sounds: No murmur heard. Pulmonary:     Effort: No respiratory distress.  Abdominal:     Palpations: Abdomen is soft.  Musculoskeletal:        General: Normal range of motion.     Comments: Patient is wearing a brace on the left foot  Skin:    General: Skin is warm.  Neurological:     General: No focal deficit present.     Mental Status: She is alert. Mental status is at baseline.  Psychiatric:        Mood and Affect: Mood normal.        Thought Content: Thought content normal.    Assessment & Plan:   Problem List Items Addressed This Visit       Cardiovascular and Mediastinum   Migraines - Primary    Patient has intermittent migraine headache syndrome        Other   Bipolar disorder, unspecified (HCC)    Under psych  care      Morbid obesity with BMI of 50.0-59.9, adult (HCC)    - I encouraged the patient to lose weight.  - I educated them on making healthy dietary choices including eating more fruits and vegetables and less fried foods. - I encouraged the patient to exercise more, and educated on the benefits of exercise including weight loss, diabetes prevention, and hypertension prevention.   Dietary counseling with a registered dietician  Referral to a weight management support group (e.g. Weight Watchers, Overeaters Anonymous)  If your BMI is greater than 29 or you have gained more than 15 pounds you should work on weight loss.  Attend a healthy cooking class       Social anxiety disorder    - Patient experiencing high levels of anxiety.  - Encouraged patient to engage in relaxing activities like yoga, meditation, journaling, going for a walk, or participating in a hobby.  - Encouraged patient to reach out to trusted friends or family members about recent  struggles, Patient was advised to read A book, how to stop worrying and start living, it is good book to read to control  the stress       Severe recurrent major depression without psychotic features [facility])    Patient is under psych care for depression  Outpatient Encounter Medications as of 05/11/2021  Medication Sig   ibuprofen (ADVIL) 200 MG tablet Take 400 mg by mouth every 6 (six) hours as needed.   Multiple Vitamin (MULTIVITAMIN WITH MINERALS) TABS tablet Take 1 tablet by mouth daily. (Patient not taking: Reported on 05/10/2021)   oxyCODONE-acetaminophen (PERCOCET) 5-325 MG tablet Take 1 tablet by mouth every 8 (eight) hours as needed for severe pain.   sertraline (ZOLOFT) 100 MG tablet Take 100 mg by mouth every morning.   terbinafine (LAMISIL) 250 MG tablet Take 1 tablet (250 mg total) by mouth daily. (Patient taking differently: Take 250 mg by mouth every morning.)   traZODone (DESYREL) 150 MG tablet Take 150 mg by mouth as needed.   VRAYLAR 1.5 MG capsule Take 1.5 mg by mouth every morning.   No facility-administered encounter medications on file as of 05/11/2021.   Patient is approved for the left foot surgery. Follow-up: No follow-ups on file.   Corky Downs, MD

## 2021-05-11 NOTE — Assessment & Plan Note (Signed)
Patient is under psych care for depression

## 2021-05-11 NOTE — Assessment & Plan Note (Signed)

## 2021-05-11 NOTE — Assessment & Plan Note (Signed)
Patient has intermittent migraine headache syndrome

## 2021-05-11 NOTE — Progress Notes (Signed)
 New Patient Office Visit  Subjective:  Patient ID: Maria Espinoza, female    DOB: 01/29/1991  Age: 30 y.o. MRN: [id number]  CC:  Chief Complaint  Patient presents with   Annual Exam    Patient needs a physical exam for surgery 05/14/2021    HPI Patient presents for check up, patient is going to have left foot surgery for removal of fixator.  She denies any chest pain shortness of breath paroxysmal nocturnal dyspnea orthopnea swelling of the legs there is no history of passing out spell.  She smokes half a pack per day.  she does not drink.  Past Medical History:  Diagnosis Date   Bipolar depression (HCC)    Difficult intubation    Dizziness    Migraines    Nausea    Rectal bleeding      Current Outpatient Medications:    ibuprofen (ADVIL) 200 MG tablet, Take 400 mg by mouth every 6 (six) hours as needed., Disp: , Rfl:    Multiple Vitamin (MULTIVITAMIN WITH MINERALS) TABS tablet, Take 1 tablet by mouth daily. (Patient not taking: Reported on 05/10/2021), Disp: , Rfl:    oxyCODONE-acetaminophen (PERCOCET) 5-325 MG tablet, Take 1 tablet by mouth every 8 (eight) hours as needed for severe pain., Disp: 20 tablet, Rfl: 0   sertraline (ZOLOFT) 100 MG tablet, Take 100 mg by mouth every morning., Disp: , Rfl:    terbinafine (LAMISIL) 250 MG tablet, Take 1 tablet (250 mg total) by mouth daily. (Patient taking differently: Take 250 mg by mouth every morning.), Disp: 90 tablet, Rfl: 0   traZODone (DESYREL) 150 MG tablet, Take 150 mg by mouth as needed., Disp: , Rfl:    VRAYLAR 1.5 MG capsule, Take 1.5 mg by mouth every morning., Disp: , Rfl:    Past Surgical History:  Procedure Laterality Date   METATARSAL OSTEOTOMY Left 01/15/2021   Procedure: METATARSAL OSTEOTOMY;  Surgeon: Evans, Brent M, DPM;  Location: [facility] ORS;  Service: Podiatry;  Laterality: Left;   TONSILLECTOMY     age 13    Family History  Problem Relation Age of Onset   Migraines Father    Heart disease Maternal  Grandmother    Depression Maternal Grandmother    Hypertension Maternal Grandmother    Breast cancer Paternal Grandfather     Social History   Socioeconomic History   Marital status: Single    Spouse name: Not on file   Number of children: Not on file   Years of education: Not on file   Highest education level: Not on file  Occupational History   Not on file  Tobacco Use   Smoking status: Some Days    Packs/day: 0.50    Years: 3.00    Pack years: 1.50    Types: Cigarettes   Smokeless tobacco: Never  Vaping Use   Vaping Use: Never used  Substance and Sexual Activity   Alcohol use: Not Currently    Comment: occasional   Drug use: Never   Sexual activity: Not Currently    Partners: Male    Birth control/protection: Abstinence  Other Topics Concern   Not on file  Social History Narrative   Not on file   Social Determinants of Health   Financial Resource Strain: Not on file  Food Insecurity: Not on file  Transportation Needs: Not on file  Physical Activity: Not on file  Stress: Not on file  Social Connections: Not on file  Intimate Partner Violence: Not At Risk     Fear of Current or Ex-Partner: No   Emotionally Abused: No   Physically Abused: No   Sexually Abused: No    ROS Review of Systems  Constitutional: Negative.   HENT: Negative.    Eyes: Negative.   Respiratory: Negative.    Cardiovascular: Negative.   Gastrointestinal: Negative.   Endocrine: Negative.   Genitourinary: Negative.   Musculoskeletal: Negative.   Skin: Negative.   Allergic/Immunologic: Negative.   Neurological: Negative.   Hematological: Negative.   Psychiatric/Behavioral: Negative.    All other systems reviewed and are negative.  Objective:   Today's Vitals: BP 121/86   Pulse 70   Ht 5' 3" (1.6 m)   Wt (!) 302 lb 4.8 oz (137.1 kg)   LMP 05/03/2021 (Exact Date)   BMI 53.55 kg/m   Physical Exam Vitals reviewed.  Constitutional:      Appearance: She is obese.  HENT:      Head: Normocephalic.     Nose: Nose normal.  Eyes:     Pupils: Pupils are equal, round, and reactive to light.  Cardiovascular:     Rate and Rhythm: Normal rate and regular rhythm.     Heart sounds: No murmur heard. Pulmonary:     Effort: No respiratory distress.  Abdominal:     Palpations: Abdomen is soft.  Musculoskeletal:        General: Normal range of motion.     Comments: Patient is wearing a brace on the left foot  Skin:    General: Skin is warm.  Neurological:     General: No focal deficit present.     Mental Status: She is alert. Mental status is at baseline.  Psychiatric:        Mood and Affect: Mood normal.        Thought Content: Thought content normal.    Assessment & Plan:   Problem List Items Addressed This Visit       Cardiovascular and Mediastinum   Migraines - Primary    Patient has intermittent migraine headache syndrome        Other   Bipolar disorder, unspecified (HCC)    Under psych  care      Morbid obesity with BMI of 50.0-59.9, adult (HCC)    - I encouraged the patient to lose weight.  - I educated them on making healthy dietary choices including eating more fruits and vegetables and less fried foods. - I encouraged the patient to exercise more, and educated on the benefits of exercise including weight loss, diabetes prevention, and hypertension prevention.   Dietary counseling with a registered dietician  Referral to a weight management support group (e.g. Weight Watchers, Overeaters Anonymous)  If your BMI is greater than 29 or you have gained more than 15 pounds you should work on weight loss.  Attend a healthy cooking class       Social anxiety disorder    - Patient experiencing high levels of anxiety.  - Encouraged patient to engage in relaxing activities like yoga, meditation, journaling, going for a walk, or participating in a hobby.  - Encouraged patient to reach out to trusted friends or family members about recent  struggles, Patient was advised to read A book, how to stop worrying and start living, it is good book to read to control  the stress       Severe recurrent major depression without psychotic features (HCC)    Patient is under psych care for depression         Outpatient Encounter Medications as of 05/11/2021  Medication Sig   ibuprofen (ADVIL) 200 MG tablet Take 400 mg by mouth every 6 (six) hours as needed.   Multiple Vitamin (MULTIVITAMIN WITH MINERALS) TABS tablet Take 1 tablet by mouth daily. (Patient not taking: Reported on 05/10/2021)   oxyCODONE-acetaminophen (PERCOCET) 5-325 MG tablet Take 1 tablet by mouth every 8 (eight) hours as needed for severe pain.   sertraline (ZOLOFT) 100 MG tablet Take 100 mg by mouth every morning.   terbinafine (LAMISIL) 250 MG tablet Take 1 tablet (250 mg total) by mouth daily. (Patient taking differently: Take 250 mg by mouth every morning.)   traZODone (DESYREL) 150 MG tablet Take 150 mg by mouth as needed.   VRAYLAR 1.5 MG capsule Take 1.5 mg by mouth every morning.   No facility-administered encounter medications on file as of 05/11/2021.   Patient is approved for the left foot surgery. Follow-up: No follow-ups on file.   Corky Downs, MD

## 2021-05-11 NOTE — Assessment & Plan Note (Signed)
-   Patient experiencing high levels of anxiety.  - Encouraged patient to engage in relaxing activities like yoga, meditation, journaling, going for a walk, or participating in a hobby.  - Encouraged patient to reach out to trusted friends or family members about recent struggles, Patient was advised to read A book, how to stop worrying and start living, it is good book to read to control  the stress  

## 2021-05-12 ENCOUNTER — Other Ambulatory Visit: Payer: Medicaid Other

## 2021-05-13 MED ORDER — LACTATED RINGERS IV SOLN: INTRAVENOUS | Status: DC

## 2021-05-13 MED ORDER — ORAL CARE MOUTH RINSE: 15.0000 mL | Freq: Once | OROMUCOSAL | Status: AC

## 2021-05-13 MED ORDER — CEFAZOLIN SODIUM-DEXTROSE 2-4 GM/100ML-% IV SOLN: 2.0000 g | INTRAVENOUS | Status: DC

## 2021-05-13 MED ORDER — FAMOTIDINE 20 MG PO TABS: 20.0000 mg | ORAL_TABLET | Freq: Once | ORAL | Status: AC

## 2021-05-13 MED ORDER — CHLORHEXIDINE GLUCONATE 0.12 % MT SOLN
15.0000 mL | Freq: Once | OROMUCOSAL | Status: AC
Start: 2021-05-13 — End: 2021-05-14
  Administered 2021-05-14: 15 mL via OROMUCOSAL

## 2021-05-14 ENCOUNTER — Other Ambulatory Visit: Payer: Self-pay

## 2021-05-14 ENCOUNTER — Encounter
Admission: RE | Disposition: A | Discharge status: Home / Self Care | Payer: Self-pay | Source: Home / Self Care | Attending: Podiatry

## 2021-05-14 ENCOUNTER — Ambulatory Visit: Payer: Medicaid Other | Admitting: Registered Nurse

## 2021-05-14 ENCOUNTER — Encounter: Payer: Self-pay | Admitting: Podiatry

## 2021-05-14 ENCOUNTER — Encounter: Payer: Medicaid Other | Admitting: Podiatry

## 2021-05-14 ENCOUNTER — Ambulatory Visit
Admission: RE | Admit: 2021-05-14 | Discharge: 2021-05-14 | Disposition: A | Discharge status: Home / Self Care | Payer: Medicaid Other | Attending: Podiatry | Admitting: Podiatry

## 2021-05-14 DIAGNOSIS — F319 Bipolar disorder, unspecified: Secondary | ICD-10-CM | POA: Insufficient documentation

## 2021-05-14 DIAGNOSIS — F418 Other specified anxiety disorders: Secondary | ICD-10-CM | POA: Insufficient documentation

## 2021-05-14 DIAGNOSIS — Z4889 Encounter for other specified surgical aftercare: Secondary | ICD-10-CM | POA: Diagnosis not present

## 2021-05-14 DIAGNOSIS — Z6841 Body Mass Index (BMI) 40.0 and over, adult: Secondary | ICD-10-CM | POA: Insufficient documentation

## 2021-05-14 DIAGNOSIS — G43909 Migraine, unspecified, not intractable, without status migrainosus: Secondary | ICD-10-CM | POA: Diagnosis not present

## 2021-05-14 DIAGNOSIS — M79672 Pain in left foot: Secondary | ICD-10-CM | POA: Insufficient documentation

## 2021-05-14 DIAGNOSIS — Z79899 Other long term (current) drug therapy: Secondary | ICD-10-CM | POA: Diagnosis not present

## 2021-05-14 DIAGNOSIS — T8189XA Other complications of procedures, not elsewhere classified, initial encounter: Secondary | ICD-10-CM | POA: Diagnosis not present

## 2021-05-14 DIAGNOSIS — Y838 Other surgical procedures as the cause of abnormal reaction of the patient, or of later complication, without mention of misadventure at the time of the procedure: Secondary | ICD-10-CM | POA: Diagnosis not present

## 2021-05-14 DIAGNOSIS — M216X2 Other acquired deformities of left foot: Secondary | ICD-10-CM | POA: Diagnosis not present

## 2021-05-14 DIAGNOSIS — F17210 Nicotine dependence, cigarettes, uncomplicated: Secondary | ICD-10-CM | POA: Insufficient documentation

## 2021-05-14 HISTORY — PX: HARDWARE REMOVAL: SHX979

## 2021-05-14 LAB — POCT PREGNANCY, URINE: Preg Test, Ur: NEGATIVE

## 2021-05-14 SURGERY — REMOVAL, HARDWARE
Anesthesia: Monitor Anesthesia Care | Site: Foot | Laterality: Left

## 2021-05-14 MED ORDER — BUPIVACAINE HCL 0.5 % IJ SOLN
INTRAMUSCULAR | Status: DC | PRN
  Administered 2021-05-14: 5 mL

## 2021-05-14 MED ORDER — FENTANYL CITRATE (PF) 100 MCG/2ML IJ SOLN
INTRAMUSCULAR | Status: AC
  Filled 2021-05-14: qty 2

## 2021-05-14 MED ORDER — ONDANSETRON HCL 4 MG/2ML IJ SOLN: 4.0000 mg | Freq: Once | INTRAMUSCULAR | Status: DC | PRN

## 2021-05-14 MED ORDER — FENTANYL CITRATE (PF) 100 MCG/2ML IJ SOLN
INTRAMUSCULAR | Status: DC | PRN
  Administered 2021-05-14: 25 ug via INTRAVENOUS

## 2021-05-14 MED ORDER — MIDAZOLAM HCL 2 MG/2ML IJ SOLN
INTRAMUSCULAR | Status: AC
  Filled 2021-05-14: qty 2

## 2021-05-14 MED ORDER — MIDAZOLAM HCL 2 MG/2ML IJ SOLN
INTRAMUSCULAR | Status: DC | PRN
  Administered 2021-05-14: 2 mg via INTRAVENOUS

## 2021-05-14 MED ORDER — OXYCODONE HCL 5 MG PO TABS
5.0000 mg | ORAL_TABLET | ORAL | Status: AC | PRN
  Administered 2021-05-14: 5 mg via ORAL

## 2021-05-14 MED ORDER — LIDOCAINE HCL (CARDIAC) PF 100 MG/5ML IV SOSY
PREFILLED_SYRINGE | INTRAVENOUS | Status: DC | PRN
  Administered 2021-05-14: 100 mg via INTRAVENOUS

## 2021-05-14 MED ORDER — OXYCODONE HCL 5 MG PO TABS
ORAL_TABLET | ORAL | Status: AC
  Filled 2021-05-14: qty 1

## 2021-05-14 MED ORDER — FAMOTIDINE 20 MG PO TABS
ORAL_TABLET | ORAL | Status: AC
  Administered 2021-05-14: 20 mg via ORAL
  Filled 2021-05-14: qty 1

## 2021-05-14 MED ORDER — LACTATED RINGERS IV SOLN: INTRAVENOUS | Status: DC

## 2021-05-14 MED ORDER — PROPOFOL 10 MG/ML IV BOLUS
INTRAVENOUS | Status: AC
  Filled 2021-05-14: qty 40

## 2021-05-14 MED ORDER — LIDOCAINE HCL (PF) 1 % IJ SOLN
INTRAMUSCULAR | Status: AC
  Filled 2021-05-14: qty 30

## 2021-05-14 MED ORDER — KETAMINE HCL 50 MG/5ML IJ SOSY
PREFILLED_SYRINGE | INTRAMUSCULAR | Status: AC
  Filled 2021-05-14: qty 5

## 2021-05-14 MED ORDER — CHLORHEXIDINE GLUCONATE 0.12 % MT SOLN
OROMUCOSAL | Status: AC
  Filled 2021-05-14: qty 15

## 2021-05-14 MED ORDER — PROPOFOL 10 MG/ML IV BOLUS
INTRAVENOUS | Status: DC | PRN
  Administered 2021-05-14 (×2): 50 mg via INTRAVENOUS

## 2021-05-14 MED ORDER — PROPOFOL 500 MG/50ML IV EMUL
INTRAVENOUS | Status: DC | PRN
  Administered 2021-05-14: 150 ug/kg/min via INTRAVENOUS

## 2021-05-14 MED ORDER — OXYCODONE-ACETAMINOPHEN 5-325 MG PO TABS: 1.0000 | ORAL_TABLET | ORAL | 0 refills | Status: DC | PRN

## 2021-05-14 MED ORDER — BUPIVACAINE HCL (PF) 0.5 % IJ SOLN
INTRAMUSCULAR | Status: AC
  Filled 2021-05-14: qty 30

## 2021-05-14 MED ORDER — LIDOCAINE HCL 1 % IJ SOLN
INTRAMUSCULAR | Status: DC | PRN
  Administered 2021-05-14: 5 mL

## 2021-05-14 SURGICAL SUPPLY — 55 items
BNDG COHESIVE 4X5 TAN ST LF (GAUZE/BANDAGES/DRESSINGS) IMPLANT
BNDG COHESIVE 6X5 TAN ST LF (GAUZE/BANDAGES/DRESSINGS) IMPLANT
BNDG CONFORM 2 STRL LF (GAUZE/BANDAGES/DRESSINGS) IMPLANT
BNDG CONFORM 3 STRL LF (GAUZE/BANDAGES/DRESSINGS) IMPLANT
BNDG ELASTIC 4X5.8 VLCR STR LF (GAUZE/BANDAGES/DRESSINGS) ×2 IMPLANT
BNDG ESMARK 4X12 TAN STRL LF (GAUZE/BANDAGES/DRESSINGS) IMPLANT
BNDG GAUZE ELAST 4 BULKY (GAUZE/BANDAGES/DRESSINGS) ×2 IMPLANT
CUFF TOURN SGL QUICK 18X4 (TOURNIQUET CUFF) IMPLANT
CUFF TOURN SGL QUICK 24 (TOURNIQUET CUFF)
CUFF TRNQT CYL 24X4X16.5-23 (TOURNIQUET CUFF) IMPLANT
DRSG EMULSION OIL 3X8 NADH (GAUZE/BANDAGES/DRESSINGS) IMPLANT
DURAPREP 26ML APPLICATOR (WOUND CARE) IMPLANT
ELECT REM PT RETURN 9FT ADLT (ELECTROSURGICAL)
ELECTRODE REM PT RTRN 9FT ADLT (ELECTROSURGICAL) IMPLANT
GAUZE 4X4 16PLY ~~LOC~~+RFID DBL (SPONGE) IMPLANT
GAUZE PACKING 1/4 X5 YD (GAUZE/BANDAGES/DRESSINGS) IMPLANT
GAUZE PACKING IODOFORM 1X5 (PACKING) IMPLANT
GAUZE SPONGE 4X4 12PLY STRL (GAUZE/BANDAGES/DRESSINGS) ×2 IMPLANT
GAUZE XEROFORM 1X8 LF (GAUZE/BANDAGES/DRESSINGS) ×2 IMPLANT
GLOVE SRG 8 PF TXTR STRL LF DI (GLOVE) IMPLANT
GLOVE SURG ENC TEXT LTX SZ8 (GLOVE) ×2 IMPLANT
GLOVE SURG UNDER POLY LF SZ8 (GLOVE)
GOWN STRL REUS W/ TWL XL LVL3 (GOWN DISPOSABLE) IMPLANT
GOWN STRL REUS W/TWL MED LVL3 (GOWN DISPOSABLE) IMPLANT
GOWN STRL REUS W/TWL XL LVL3 (GOWN DISPOSABLE)
HANDPIECE VERSAJET DEBRIDEMENT (MISCELLANEOUS) IMPLANT
IV NS 1000ML (IV SOLUTION)
IV NS 1000ML BAXH (IV SOLUTION) IMPLANT
IV NS IRRIG 3000ML ARTHROMATIC (IV SOLUTION) IMPLANT
KIT TURNOVER KIT A (KITS) IMPLANT
LABEL OR SOLS (LABEL) IMPLANT
MANIFOLD NEPTUNE II (INSTRUMENTS) IMPLANT
NEEDLE FILTER BLUNT 18X 1/2SAF (NEEDLE) ×1
NEEDLE FILTER BLUNT 18X1 1/2 (NEEDLE) ×1 IMPLANT
NEEDLE HYPO 25X1 1.5 SAFETY (NEEDLE) ×2 IMPLANT
NS IRRIG 500ML POUR BTL (IV SOLUTION) IMPLANT
PACK EXTREMITY ARMC (MISCELLANEOUS) ×2 IMPLANT
PAD ABD DERMACEA PRESS 5X9 (GAUZE/BANDAGES/DRESSINGS) ×2 IMPLANT
PULSAVAC PLUS IRRIG FAN TIP (DISPOSABLE)
SOL PREP PVP 2OZ (MISCELLANEOUS)
SOLUTION PREP PVP 2OZ (MISCELLANEOUS) IMPLANT
STAPLER SKIN PROX 35W (STAPLE) IMPLANT
STOCKINETTE IMPERVIOUS 9X36 MD (GAUZE/BANDAGES/DRESSINGS) IMPLANT
SUT ETHILON 2 0 FS 18 (SUTURE) IMPLANT
SUT ETHILON 4-0 (SUTURE)
SUT ETHILON 4-0 FS2 18XMFL BLK (SUTURE)
SUT PROLENE 3 0 PS 2 (SUTURE) IMPLANT
SUT VIC AB 3-0 SH 27 (SUTURE)
SUT VIC AB 3-0 SH 27X BRD (SUTURE) IMPLANT
SUT VIC AB 4-0 FS2 27 (SUTURE) IMPLANT
SUTURE ETHLN 4-0 FS2 18XMF BLK (SUTURE) IMPLANT
SWAB CULTURE AMIES ANAERIB BLU (MISCELLANEOUS) IMPLANT
SYR 10ML LL (SYRINGE) ×4 IMPLANT
TIP FAN IRRIG PULSAVAC PLUS (DISPOSABLE) IMPLANT
WATER STERILE IRR 500ML POUR (IV SOLUTION) IMPLANT

## 2021-05-14 NOTE — H&P (Signed)
History and Physical Interval Note:  05/14/2021 4:22 PM  Maria Espinoza  has presented today for surgery, with the diagnosis of PAINFUL HARDWARE.  The various methods of treatment have been discussed with the patient and family. After consideration of risks, benefits and other options for treatment, the patient has consented to  Procedure(s): PAINFUL HARDWARE REMOVAL (Left) as a surgical intervention.  The patient's history has been reviewed, patient examined, no change in status, stable for surgery.  I have reviewed the patient's chart and labs.  Questions were answered to the patient's satisfaction.     Felecia Shelling

## 2021-05-14 NOTE — Discharge Instructions (Signed)
AMBULATORY SURGERY  ?DISCHARGE INSTRUCTIONS ? ? ?The drugs that you were given will stay in your system until tomorrow so for the next 24 hours you should not: ? ?Drive an automobile ?Make any legal decisions ?Drink any alcoholic beverage ? ? ?You may resume regular meals tomorrow.  Today it is better to start with liquids and gradually work up to solid foods. ? ?You may eat anything you prefer, but it is better to start with liquids, then soup and crackers, and gradually work up to solid foods. ? ? ?Please notify your doctor immediately if you have any unusual bleeding, trouble breathing, redness and pain at the surgery site, drainage, fever, or pain not relieved by medication. ? ? ? ?Additional Instructions: ? ? ? ?Please contact your physician with any problems or Same Day Surgery at [phone], Monday through Friday 6 am to 4 pm, or [place] [facility] [place] [facility] number at [phone].  ?

## 2021-05-14 NOTE — Brief Op Note (Signed)
05/14/2021  4:53 PM  PATIENT:  Maria Espinoza  30 y.o. female  PRE-OPERATIVE DIAGNOSIS:  PAINFUL HARDWARE  POST-OPERATIVE DIAGNOSIS:  * No post-op diagnosis entered *  PROCEDURE:  Procedure(s): PAINFUL HARDWARE REMOVAL (Left)  SURGEON:  Surgeon(s) and Role:    Edrick Kins, DPM - Primary  PHYSICIAN ASSISTANT:   ASSISTANTS: none   ANESTHESIA:   MAC  EBL:  minimal   BLOOD ADMINISTERED:none  DRAINS: none   LOCAL MEDICATIONS USED:  MARCAINE    and LIDOCAINE   SPECIMEN:  No Specimen  DISPOSITION OF SPECIMEN:  N/A  COUNTS:  YES  TOURNIQUET:  * Missing tourniquet times found for documented tourniquets in log: [id number] *  DICTATION: .Dragon Dictation  PLAN OF CARE: Discharge to home after PACU  PATIENT DISPOSITION:  PACU - hemodynamically stable.   Delay start of Pharmacological VTE agent (>24hrs) due to surgical blood loss or risk of bleeding: not applicable

## 2021-05-14 NOTE — Anesthesia Preprocedure Evaluation (Addendum)
Anesthesia Evaluation  Patient identified by MRN, date of birth, ID band Patient awake    Reviewed: Allergy & Precautions, NPO status , Patient's Chart, lab work & pertinent test results  History of Anesthesia Complications Negative for: history of anesthetic complications  Airway Mallampati: II  TM Distance: >3 FB Neck ROM: Full    Dental no notable dental hx. (+) Teeth Intact   Pulmonary neg pulmonary ROS, Current Smoker and Patient abstained from smoking.,    Pulmonary exam normal breath sounds clear to auscultation       Cardiovascular Exercise Tolerance: Good METS: 3 - Mets (-) hypertension(-) CAD negative cardio ROS Normal cardiovascular exam(-) dysrhythmias  Rhythm:Regular Rate:Normal     Neuro/Psych  Headaches, PSYCHIATRIC DISORDERS Anxiety Depression Bipolar Disorder    GI/Hepatic negative GI ROS, Neg liver ROS,   Endo/Other  negative endocrine ROS  Renal/GU negative Renal ROS  negative genitourinary   Musculoskeletal negative musculoskeletal ROS (+)   Abdominal (+) + obese,   Peds negative pediatric ROS (+)  Hematology negative hematology ROS (+)   Anesthesia Other Findings   Reproductive/Obstetrics negative OB ROS                            Anesthesia Physical Anesthesia Plan  ASA: 2  Anesthesia Plan: MAC   Post-op Pain Management:    Induction: Intravenous  PONV Risk Score and Plan:   Airway Management Planned: Natural Airway and Nasal Cannula  Additional Equipment:   Intra-op Plan:   Post-operative Plan:   Informed Consent: I have reviewed the patients History and Physical, chart, labs and discussed the procedure including the risks, benefits and alternatives for the proposed anesthesia with the patient or authorized representative who has indicated his/her understanding and acceptance.     Dental Advisory Given  Plan Discussed with: Anesthesiologist, CRNA  and Surgeon  Anesthesia Plan Comments: (Patient consented for risks of anesthesia including but not limited to:  - adverse reactions to medications - damage to eyes, teeth, lips or other oral mucosa - nerve damage due to positioning  - sore throat or hoarseness - Damage to heart, brain, nerves, lungs, other parts of body or loss of life  Patient voiced understanding.)        Anesthesia Quick Evaluation

## 2021-05-14 NOTE — Anesthesia Procedure Notes (Signed)
Procedure Name: MAC Date/Time: 05/14/2021 4:43 PM Performed by: Tollie Eth, CRNA Pre-anesthesia Checklist: Patient identified, Emergency Drugs available, Suction available and Patient being monitored Patient Re-evaluated:Patient Re-evaluated prior to induction Oxygen Delivery Method: Simple face mask

## 2021-05-14 NOTE — Interval H&P Note (Signed)
History and Physical Interval Note:  05/14/2021 4:23 PM  Maria Espinoza  has presented today for surgery, with the diagnosis of PAINFUL HARDWARE.  The various methods of treatment have been discussed with the patient and family. After consideration of risks, benefits and other options for treatment, the patient has consented to  Procedure(s): PAINFUL HARDWARE REMOVAL (Left) as a surgical intervention.  The patient's history has been reviewed, patient examined, no change in status, stable for surgery.  I have reviewed the patient's chart and labs.  Questions were answered to the patient's satisfaction.     Felecia Shelling

## 2021-05-14 NOTE — Transfer of Care (Signed)
Immediate Anesthesia Transfer of Care Note  Patient: Maria Espinoza  Procedure(s) Performed: PAINFUL HARDWARE REMOVAL (Left)  Patient Location: PACU  Anesthesia Type:General  Level of Consciousness: drowsy  Airway & Oxygen Therapy: Patient Spontanous Breathing and Patient connected to face mask oxygen  Post-op Assessment: Report given to RN and Post -op Vital signs reviewed and stable  Post vital signs: Reviewed and stable  Last Vitals:  Vitals Value Taken Time  BP    Temp    Pulse 86 05/14/21 1657  Resp 23 05/14/21 1657  SpO2 99 % 05/14/21 1657  Vitals shown include unvalidated device data.  Last Pain:  Vitals:   05/14/21 1542  TempSrc: Temporal         Complications: No notable events documented.

## 2021-05-14 NOTE — Anesthesia Postprocedure Evaluation (Addendum)
Anesthesia Post Note  Patient: Maria Espinoza  Procedure(s) Performed: LEFT FOOT HARDWARE REMOVAL (Left: Foot)  Patient location during evaluation: PACU Anesthesia Type: MAC Level of consciousness: awake and alert Pain management: pain level controlled Vital Signs Assessment: post-procedure vital signs reviewed and stable Respiratory status: spontaneous breathing, nonlabored ventilation, respiratory function stable and patient connected to nasal cannula oxygen Cardiovascular status: blood pressure returned to baseline and stable Postop Assessment: no apparent nausea or vomiting Anesthetic complications: no   No notable events documented.   Last Vitals:  Vitals:   05/14/21 1730 05/14/21 1746  BP: 119/73 127/68  Pulse: 73 70  Resp: (!) 23 16  Temp: (!) 36.1 C (!) 36.2 C  SpO2: 99% 100%    Last Pain:  Vitals:   05/14/21 1746  TempSrc: Temporal  PainSc: 0-No pain                 Tonny Bollman

## 2021-05-17 ENCOUNTER — Encounter: Payer: Self-pay | Admitting: Podiatry

## 2021-05-18 NOTE — Op Note (Signed)
   OPERATIVE REPORT Patient name: Maria Espinoza MRN: [id number] DOB: [birth year]  DOS: 05/14/2021  Preop Dx: Intact external fixator left foot Postop Dx: same  Procedure:  1.  Removal of external fixation device left foot  Surgeon: Felecia Shelling DPM  Anesthesia: 50-50 mixture of 2% lidocaine plain with 0.5% Marcaine plain totaling 20 mL infiltrated in the patient's left lower extremity in a localized block fashion  Hemostasis: None  EBL: Minimal mL Materials: None Injectables: None Pathology: None  Condition: The patient tolerated the procedure and anesthesia well. No complications noted or reported   Justification for procedure: The patient is a 30 y.o. female who presents today for surgical removal of an external fixation device to the left foot that was applied prior to correct for brachia metatarsalgia. The patient was told benefits as well as possible side effects of the surgery. The patient consented for surgical correction. The patient consent form was reviewed. All patient questions were answered. No guarantees were expressed or implied. The patient and the surgeon both signed the patient consent form with the witness present and placed in the patient's chart.   Procedure in Detail: The patient was brought to the operating room, remained in the hospital bed in the supine position at which time an aseptic scrub and drape were performed about the patient's respective lower extremity after anesthesia was induced as described above. Attention was then directed to the surgical area where procedure number one commenced.  Procedure #1: Removal of external fixator LT foot After aseptic preparation of the area the external fixator was loosened and removed.  The percutaneous external fixator Schanz pins x4 were then removed.  Dry sterile compressive dressings were then applied to all previously mentioned incision sites about the patient's lower extremity. The tourniquet which was used  for hemostasis was deflated. All normal neurovascular responses including pink color and warmth returned all the digits of patient's lower extremity.  The patient was then transferred from the operating room to the recovery room having tolerated the procedure and anesthesia well. All vital signs are stable. After a brief stay in the recovery room the patient was discharged with adequate prescriptions for analgesia. Verbal as well as written instructions were provided for the patient regarding wound care. The patient is to keep the dressings clean dry and intact until they are to follow surgeon Dr. Gala Lewandowsky in the office upon discharge.   Felecia Shelling, DPM [facility]  Dr. Felecia Shelling, DPM    [address]                                        [place], [place] [address]                Office [phone]  Fax [phone]

## 2021-05-21 ENCOUNTER — Encounter: Payer: Self-pay | Admitting: Podiatry

## 2021-05-21 ENCOUNTER — Encounter: Payer: Medicaid Other | Admitting: Podiatry

## 2021-05-21 ENCOUNTER — Other Ambulatory Visit: Payer: Self-pay

## 2021-05-21 ENCOUNTER — Ambulatory Visit (INDEPENDENT_AMBULATORY_CARE_PROVIDER_SITE_OTHER): Payer: Medicaid Other | Admitting: Podiatry

## 2021-05-21 DIAGNOSIS — Q72899 Other reduction defects of unspecified lower limb: Secondary | ICD-10-CM

## 2021-05-21 DIAGNOSIS — M216X1 Other acquired deformities of right foot: Secondary | ICD-10-CM | POA: Diagnosis not present

## 2021-05-21 DIAGNOSIS — Z9889 Other specified postprocedural states: Secondary | ICD-10-CM

## 2021-05-21 NOTE — Progress Notes (Signed)
   Subjective:  Patient presents today status post removal of external fixation device left foot. DOS: 05/14/2021.  Patient states that her left foot is feeling well.  She is back into regular shoes.  Patient has no pain associated to the area.  Patient states that she is ready to have her right foot surgery performed..  Patient has experienced brachia metatarsalgia to the bilateral feet and now the left is corrected she would like to have the right one performed soon as possible.  She presents for further treatment and evaluation  Past Medical History:  Diagnosis Date   Bipolar depression (HCC)    Difficult intubation    Dizziness    Migraines    Nausea    Rectal bleeding       Objective/Physical Exam Neurovascular status intact.  Skin incisions appear to be well coapted.  No drainage.  The incision is healed however there is some hypertrophic scar along the dorsum of the foot.  Right foot continues to demonstrate brachia metatarsalgia.  It is symptomatic and she would like to have it distracted and proceed with the surgery.  No change in the foot since x-rays were initially taken   Assessment: 1. s/p removal of external fixation device LT foot. DOS: 05/14/2021 2.  Brachymetatarsalgia right fourth metatarsal   Plan of Care:  1. Patient was evaluated. 2. 2. Today we discussed the conservative versus surgical management of the presenting pathology. The patient opts for surgical management. All possible complications and details of the procedure were explained. All patient questions were answered. No guarantees were expressed or implied. 3. Authorization for surgery was initiated today. Surgery will consist of fourth metatarsal osteotomy with application of mini rail external fixation device for callus distraction 4.  Return to clinic 1 week postop    Felecia Shelling, DPM [facility]  Dr. Felecia Shelling, DPM    [address] [place], [place] [address]                Office [phone]  Fax [phone]

## 2021-05-27 ENCOUNTER — Other Ambulatory Visit
Admission: RE | Admit: 2021-05-27 | Discharge: 2021-05-27 | Disposition: A | Discharge status: Home / Self Care | Payer: Medicaid Other | Source: Ambulatory Visit | Attending: Podiatry | Admitting: Podiatry

## 2021-05-27 ENCOUNTER — Other Ambulatory Visit: Payer: Self-pay

## 2021-05-27 ENCOUNTER — Inpatient Hospital Stay: Admission: RE | Admit: 2021-05-27 | Payer: Medicaid Other | Source: Ambulatory Visit

## 2021-05-27 DIAGNOSIS — Z6841 Body Mass Index (BMI) 40.0 and over, adult: Secondary | ICD-10-CM

## 2021-05-27 HISTORY — DX: Anemia, unspecified: D64.9

## 2021-05-27 HISTORY — DX: Depression, unspecified: F32.A

## 2021-05-27 HISTORY — DX: Anxiety disorder, unspecified: F41.9

## 2021-05-27 MED ORDER — LACTATED RINGERS IV SOLN: INTRAVENOUS | Status: DC

## 2021-05-27 MED ORDER — CEFAZOLIN IN SODIUM CHLORIDE 3-0.9 GM/100ML-% IV SOLN
3.0000 g | INTRAVENOUS | Status: AC
  Administered 2021-05-28: 3 g via INTRAVENOUS
  Filled 2021-05-27: qty 100

## 2021-05-27 MED ORDER — CHLORHEXIDINE GLUCONATE 0.12 % MT SOLN: 15.0000 mL | Freq: Once | OROMUCOSAL | Status: AC

## 2021-05-27 MED ORDER — ORAL CARE MOUTH RINSE: 15.0000 mL | Freq: Once | OROMUCOSAL | Status: AC

## 2021-05-27 MED ORDER — FAMOTIDINE 20 MG PO TABS: 20.0000 mg | ORAL_TABLET | Freq: Once | ORAL | Status: AC

## 2021-05-27 NOTE — Patient Instructions (Addendum)
Your procedure is scheduled on: 05/28/21 Report to the Registration Desk on the 1st floor of the [facility]. To find out your arrival time, please call [phone] between 1PM - 3PM on: 05/27/21  REMEMBER: Instructions that are not followed completely may result in serious medical risk, up to and including death; or upon the discretion of your surgeon and anesthesiologist your surgery may need to be rescheduled.  Do not eat food after midnight the night before surgery.  No gum chewing, lozengers or hard candies.  You may however, drink CLEAR liquids up to 2 hours before you are scheduled to arrive for your surgery. Do not drink anything within 2 hours of your scheduled arrival time.  Clear liquids include: - water  - apple juice without pulp - gatorade (not RED, PURPLE, OR BLUE) - black coffee or tea (Do NOT add milk or creamers to the coffee or tea) Do NOT drink anything that is not on this list.  TAKE THESE MEDICATIONS THE MORNING OF SURGERY WITH A SIP OF WATER:  - VRAYLAR 3 MG capsule - sertraline (ZOLOFT) 100 MG tablet - terbinafine (LAMISIL) 250 MG tablet  One week prior to surgery: Stop Anti-inflammatories (NSAIDS) such as Advil, Aleve, Ibuprofen, Motrin, Naproxen, Naprosyn and Aspirin based products such as Excedrin, Goodys Powder, BC Powder.  Stop ANY OVER THE COUNTER supplements until after surgery.  You may take Tylenol as directed if needed for pain up until the day of surgery.  No Alcohol for 24 hours before or after surgery.  No Smoking including e-cigarettes for 24 hours prior to surgery.  No chewable tobacco products for at least 6 hours prior to surgery.  No nicotine patches on the day of surgery.  Do not use any "recreational" drugs for at least a week prior to your surgery.  Please be advised that the combination of cocaine and anesthesia may have negative outcomes, up to and including death. If you test positive for cocaine, your surgery will be  cancelled.  On the morning of surgery brush your teeth with toothpaste and water, you may rinse your mouth with mouthwash if you wish. Do not swallow any toothpaste or mouthwash.  Take a fresh shower/bath the morning of surgery, you may apply deodorant.  Do not wear jewelry, make-up, hairpins, clips or nail polish.  Do not wear lotions, powders, or perfumes.   Do not shave body from the neck down 48 hours prior to surgery just in case you cut yourself which could leave a site for infection.  Also, freshly shaved skin may become irritated if using the CHG soap.  Contact lenses, hearing aids and dentures may not be worn into surgery.  Do not bring valuables to the hospital. [facility] is not responsible for any missing/lost belongings or valuables.   Notify your doctor if there is any change in your medical condition (cold, fever, infection).  Wear comfortable clothing (specific to your surgery type) to the hospital.  After surgery, you can help prevent lung complications by doing breathing exercises.  Take deep breaths and cough every 1-2 hours. Your doctor may order a device called an Incentive Spirometer to help you take deep breaths. When coughing or sneezing, hold a pillow firmly against your incision with both hands. This is called "splinting." Doing this helps protect your incision. It also decreases belly discomfort.  If you are being admitted to the hospital overnight, leave your suitcase in the car. After surgery it may be brought to your room.  If you are being discharged the day of surgery, you will not be allowed to drive home. You will need a responsible adult (18 years or older) to drive you home and stay with you that night.   If you are taking public transportation, you will need to have a responsible adult (18 years or older) with you. Please confirm with your physician that it is acceptable to use public transportation.   Please call the Pre-admissions Testing  Dept. at [phone] if you have any questions about these instructions.  Surgery Visitation Policy:  Patients undergoing a surgery or procedure may have one family member or support person with them as long as that person is not COVID-19 positive or experiencing its symptoms.  That person may remain in the waiting area during the procedure and may rotate out with other people.  Inpatient Visitation:    Visiting hours are 7 a.m. to 8 p.m. Up to two visitors ages 16+ are allowed at one time in a patient room. The visitors may rotate out with other people during the day. Visitors must check out when they leave, or other visitors will not be allowed. One designated support person may remain overnight. The visitor must pass COVID-19 screenings, use hand sanitizer when entering and exiting the patient's room and wear a mask at all times, including in the patient's room. Patients must also wear a mask when staff or their visitor are in the room. Masking is required regardless of vaccination status.

## 2021-05-28 ENCOUNTER — Encounter
Admission: RE | Disposition: A | Discharge status: Home / Self Care | Payer: Self-pay | Source: Ambulatory Visit | Attending: Podiatry

## 2021-05-28 ENCOUNTER — Encounter: Payer: Self-pay | Admitting: Podiatry

## 2021-05-28 ENCOUNTER — Ambulatory Visit: Payer: Medicaid Other

## 2021-05-28 ENCOUNTER — Ambulatory Visit
Admission: RE | Admit: 2021-05-28 | Discharge: 2021-05-28 | Disposition: A | Discharge status: Home / Self Care | Payer: Medicaid Other | Source: Ambulatory Visit | Attending: Podiatry | Admitting: Podiatry

## 2021-05-28 ENCOUNTER — Other Ambulatory Visit: Payer: Self-pay

## 2021-05-28 ENCOUNTER — Ambulatory Visit: Payer: Medicaid Other | Admitting: Anesthesiology

## 2021-05-28 ENCOUNTER — Ambulatory Visit: Payer: Medicaid Other | Admitting: Urgent Care

## 2021-05-28 ENCOUNTER — Encounter: Payer: Medicaid Other | Admitting: Podiatry

## 2021-05-28 DIAGNOSIS — M1990 Unspecified osteoarthritis, unspecified site: Secondary | ICD-10-CM | POA: Diagnosis not present

## 2021-05-28 DIAGNOSIS — M7741 Metatarsalgia, right foot: Secondary | ICD-10-CM | POA: Diagnosis present

## 2021-05-28 DIAGNOSIS — Q72891 Other reduction defects of right lower limb: Secondary | ICD-10-CM

## 2021-05-28 DIAGNOSIS — F17210 Nicotine dependence, cigarettes, uncomplicated: Secondary | ICD-10-CM | POA: Insufficient documentation

## 2021-05-28 DIAGNOSIS — Z6841 Body Mass Index (BMI) 40.0 and over, adult: Secondary | ICD-10-CM | POA: Diagnosis not present

## 2021-05-28 DIAGNOSIS — Q666 Other congenital valgus deformities of feet: Secondary | ICD-10-CM

## 2021-05-28 DIAGNOSIS — F319 Bipolar disorder, unspecified: Secondary | ICD-10-CM | POA: Diagnosis not present

## 2021-05-28 HISTORY — PX: METATARSAL OSTEOTOMY: SHX1641

## 2021-05-28 LAB — BASIC METABOLIC PANEL
Anion gap: 7 (ref 5–15)
BUN: 14 mg/dL (ref 6–20)
CO2: 23 mmol/L (ref 22–32)
Calcium: 8.9 mg/dL (ref 8.9–10.3)
Chloride: 109 mmol/L (ref 98–111)
Creatinine, Ser: 0.68 mg/dL (ref 0.44–1.00)
GFR, Estimated: >60 mL/min (ref >60)
Glucose, Bld: 95 mg/dL (ref 70–99)
Potassium: 4 mmol/L (ref 3.5–5.1)
Sodium: 139 mmol/L (ref 135–145)

## 2021-05-28 LAB — POCT PREGNANCY, URINE: Preg Test, Ur: NEGATIVE

## 2021-05-28 SURGERY — OSTEOTOMY, METATARSAL BONE
Anesthesia: General | Site: Toe | Laterality: Right

## 2021-05-28 MED ORDER — CHLORHEXIDINE GLUCONATE 0.12 % MT SOLN
OROMUCOSAL | Status: AC
  Administered 2021-05-28: 15 mL via OROMUCOSAL
  Filled 2021-05-28: qty 15

## 2021-05-28 MED ORDER — FENTANYL CITRATE (PF) 100 MCG/2ML IJ SOLN
INTRAMUSCULAR | Status: AC
  Administered 2021-05-28: 50 ug via INTRAVENOUS
  Filled 2021-05-28: qty 2

## 2021-05-28 MED ORDER — OXYCODONE HCL 5 MG/5ML PO SOLN: 5.0000 mg | Freq: Once | ORAL | Status: AC | PRN

## 2021-05-28 MED ORDER — PROPOFOL 500 MG/50ML IV EMUL
INTRAVENOUS | Status: DC | PRN
  Administered 2021-05-28: 150 ug/kg/min via INTRAVENOUS

## 2021-05-28 MED ORDER — OXYCODONE HCL 5 MG PO TABS
ORAL_TABLET | ORAL | Status: AC
  Administered 2021-05-28: 5 mg via ORAL
  Filled 2021-05-28: qty 1

## 2021-05-28 MED ORDER — OXYCODONE-ACETAMINOPHEN 5-325 MG PO TABS: 1.0000 | ORAL_TABLET | ORAL | 0 refills | Status: DC | PRN

## 2021-05-28 MED ORDER — MIDAZOLAM HCL 2 MG/2ML IJ SOLN
INTRAMUSCULAR | Status: DC | PRN
  Administered 2021-05-28: 2 mg via INTRAVENOUS

## 2021-05-28 MED ORDER — KETAMINE HCL 10 MG/ML IJ SOLN
INTRAMUSCULAR | Status: DC | PRN
  Administered 2021-05-28: 30 mg via INTRAVENOUS
  Administered 2021-05-28: 20 mg via INTRAVENOUS

## 2021-05-28 MED ORDER — KETAMINE HCL 50 MG/5ML IJ SOSY
PREFILLED_SYRINGE | INTRAMUSCULAR | Status: AC
  Filled 2021-05-28: qty 5

## 2021-05-28 MED ORDER — BUPIVACAINE HCL (PF) 0.5 % IJ SOLN
INTRAMUSCULAR | Status: AC
  Filled 2021-05-28: qty 30

## 2021-05-28 MED ORDER — MIDAZOLAM HCL 2 MG/2ML IJ SOLN
INTRAMUSCULAR | Status: AC
  Filled 2021-05-28: qty 2

## 2021-05-28 MED ORDER — PROMETHAZINE HCL 25 MG/ML IJ SOLN: 6.2500 mg | INTRAMUSCULAR | Status: DC | PRN

## 2021-05-28 MED ORDER — PROPOFOL 1000 MG/100ML IV EMUL
INTRAVENOUS | Status: AC
  Filled 2021-05-28: qty 100

## 2021-05-28 MED ORDER — FENTANYL CITRATE (PF) 100 MCG/2ML IJ SOLN
25.0000 ug | INTRAMUSCULAR | Status: DC | PRN
Start: 2021-05-28 — End: 2021-05-29
  Administered 2021-05-28 (×2): 50 ug via INTRAVENOUS

## 2021-05-28 MED ORDER — ACETAMINOPHEN 10 MG/ML IV SOLN: 1000.0000 mg | Freq: Once | INTRAVENOUS | Status: DC | PRN

## 2021-05-28 MED ORDER — PROPOFOL 10 MG/ML IV BOLUS
INTRAVENOUS | Status: DC | PRN
  Administered 2021-05-28: 80 mg via INTRAVENOUS

## 2021-05-28 MED ORDER — FENTANYL CITRATE (PF) 100 MCG/2ML IJ SOLN
INTRAMUSCULAR | Status: AC
  Filled 2021-05-28: qty 2

## 2021-05-28 MED ORDER — OXYCODONE HCL 5 MG PO TABS: 5.0000 mg | ORAL_TABLET | Freq: Once | ORAL | Status: AC | PRN

## 2021-05-28 MED ORDER — LIDOCAINE HCL 1 % IJ SOLN
INTRAMUSCULAR | Status: DC | PRN
  Administered 2021-05-28: 20 mL via INTRAMUSCULAR

## 2021-05-28 MED ORDER — LIDOCAINE HCL (PF) 1 % IJ SOLN
INTRAMUSCULAR | Status: AC
  Filled 2021-05-28: qty 30

## 2021-05-28 MED ORDER — FENTANYL CITRATE (PF) 100 MCG/2ML IJ SOLN
INTRAMUSCULAR | Status: DC | PRN
  Administered 2021-05-28: 50 ug via INTRAVENOUS

## 2021-05-28 MED ORDER — FAMOTIDINE 20 MG PO TABS
ORAL_TABLET | ORAL | Status: AC
  Administered 2021-05-28: 20 mg via ORAL
  Filled 2021-05-28: qty 1

## 2021-05-28 SURGICAL SUPPLY — 78 items
.045 K wire  spade tip 00505004300 (Wire) ×3 IMPLANT
ADH LQ OCL WTPRF AMP STRL LF (MISCELLANEOUS) ×2
ADHESIVE MASTISOL STRL (MISCELLANEOUS) ×3 IMPLANT
ASMB FX MN 55 NS LF MINIRAIL (Miscellaneous) ×2 IMPLANT
ASSEMBLY X-FIX MINI 55 (Miscellaneous) ×2 IMPLANT
BLADE OSCILLATING/SAGITTAL (BLADE) ×3
BLADE SURG 10 STRL SS SAFETY (BLADE) IMPLANT
BLADE SURG 15 STRL LF DISP TIS (BLADE) ×2 IMPLANT
BLADE SURG 15 STRL SS (BLADE) ×3
BLADE SW THK.38XMED LNG THN (BLADE) ×2 IMPLANT
BNDG CMPR 75X41 PLY HI ABS (GAUZE/BANDAGES/DRESSINGS) ×4
BNDG CMPR STD VLCR NS LF 5.8X4 (GAUZE/BANDAGES/DRESSINGS) ×4
BNDG COHESIVE 4X5 TAN ST LF (GAUZE/BANDAGES/DRESSINGS) ×3 IMPLANT
BNDG COHESIVE 6X5 TAN ST LF (GAUZE/BANDAGES/DRESSINGS) IMPLANT
BNDG CONFORM 2 STRL LF (GAUZE/BANDAGES/DRESSINGS) ×3 IMPLANT
BNDG CONFORM 3 STRL LF (GAUZE/BANDAGES/DRESSINGS) ×3 IMPLANT
BNDG ELASTIC 4X5.8 VLCR NS LF (GAUZE/BANDAGES/DRESSINGS) ×6 IMPLANT
BNDG ELASTIC 4X5.8 VLCR STR LF (GAUZE/BANDAGES/DRESSINGS) ×3 IMPLANT
BNDG GAUZE ELAST 4 BULKY (GAUZE/BANDAGES/DRESSINGS) ×3 IMPLANT
BNDG STRETCH 4X75 STRL LF (GAUZE/BANDAGES/DRESSINGS) ×6 IMPLANT
COVER PIN YLW 0.028-062 (MISCELLANEOUS) ×3 IMPLANT
CUFF TOURN SGL QUICK 18 (TOURNIQUET CUFF) ×3 IMPLANT
DRAIN PENROSE 12X.25 LTX STRL (MISCELLANEOUS) IMPLANT
DRAPE C-ARMOR (DRAPES) ×3 IMPLANT
DRAPE FLUOR MINI C-ARM 54X84 (DRAPES) IMPLANT
DRSG EMULSION OIL 3X8 NADH (GAUZE/BANDAGES/DRESSINGS) IMPLANT
ELECT REM PT RETURN 9FT ADLT (ELECTROSURGICAL) ×3
ELECTRODE REM PT RTRN 9FT ADLT (ELECTROSURGICAL) ×2 IMPLANT
GAUZE 4X4 16PLY ~~LOC~~+RFID DBL (SPONGE) ×3 IMPLANT
GAUZE PACKING IODOFORM 1/2 (PACKING) IMPLANT
GAUZE SPONGE 4X4 12PLY STRL (GAUZE/BANDAGES/DRESSINGS) ×3 IMPLANT
GAUZE XEROFORM 1X8 LF (GAUZE/BANDAGES/DRESSINGS) ×3 IMPLANT
GLOVE SRG 8 PF TXTR STRL LF DI (GLOVE) ×2 IMPLANT
GLOVE SURG ENC MOIS LTX SZ7 (GLOVE) ×6 IMPLANT
GLOVE SURG ENC TEXT LTX SZ8 (GLOVE) ×3 IMPLANT
GLOVE SURG UNDER POLY LF SZ8 (GLOVE) ×3
GOWN STRL REUS W/ TWL XL LVL3 (GOWN DISPOSABLE) ×2 IMPLANT
GOWN STRL REUS W/TWL MED LVL3 (GOWN DISPOSABLE) ×3 IMPLANT
GOWN STRL REUS W/TWL XL LVL3 (GOWN DISPOSABLE) ×3
HANDLE YANKAUER SUCT BULB TIP (MISCELLANEOUS) IMPLANT
IV NS 1000ML (IV SOLUTION) ×3
IV NS 1000ML BAXH (IV SOLUTION) ×2 IMPLANT
KIT TURNOVER KIT A (KITS) ×3 IMPLANT
MANIFOLD NEPTUNE II (INSTRUMENTS) ×3 IMPLANT
NDL SAFETY ECLIPSE 18X1.5 (NEEDLE) ×2 IMPLANT
NEEDLE FILTER BLUNT 18X 1/2SAF (NEEDLE) ×1
NEEDLE FILTER BLUNT 18X1 1/2 (NEEDLE) ×2 IMPLANT
NEEDLE HYPO 18GX1.5 SHARP (NEEDLE) ×3
NEEDLE HYPO 25X1 1.5 SAFETY (NEEDLE) ×3 IMPLANT
NS IRRIG 500ML POUR BTL (IV SOLUTION) ×3 IMPLANT
PACK EXTREMITY ARMC (MISCELLANEOUS) ×3 IMPLANT
PAD ABD DERMACEA PRESS 5X9 (GAUZE/BANDAGES/DRESSINGS) ×6 IMPLANT
PENCIL ELECTRO HAND CTR (MISCELLANEOUS) ×3 IMPLANT
PIN CORTICAL S DRILL 2.4X50X15 (Pin) ×6 IMPLANT
PIN CORTICAL S DRILL 2X4.50X10 (Pin) ×6 IMPLANT
SOL PREP PVP 2OZ (MISCELLANEOUS) ×3
SOLUTION PREP PVP 2OZ (MISCELLANEOUS) ×2 IMPLANT
SPONGE T-LAP 18X18 ~~LOC~~+RFID (SPONGE) IMPLANT
STAPLER SKIN PROX 35W (STAPLE) IMPLANT
STOCKINETTE IMPERVIOUS 9X36 MD (GAUZE/BANDAGES/DRESSINGS) IMPLANT
STOCKINETTE M/LG 89821 (MISCELLANEOUS) ×3 IMPLANT
STRIP CLOSURE SKIN 1/4X4 (GAUZE/BANDAGES/DRESSINGS) ×3 IMPLANT
SUT ETHILON 2 0 FS 18 (SUTURE) ×3 IMPLANT
SUT ETHILON 4-0 (SUTURE) ×3
SUT ETHILON 4-0 FS2 18XMFL BLK (SUTURE) ×2
SUT MNCRL+ 5-0 UNDYED PC-3 (SUTURE) ×2 IMPLANT
SUT MONOCRYL 5-0 (SUTURE) ×1
SUT PROLENE 3 0 PS 2 (SUTURE) IMPLANT
SUT VICRYL 4-0  27 PS-2 BARIAT (SUTURE) ×1
SUT VICRYL 4-0 27 PS-2 BARIAT (SUTURE) ×2
SUTURE ETHLN 4-0 FS2 18XMF BLK (SUTURE) ×2 IMPLANT
SUTURE VICRYL 4-0 27 PS-2 BART (SUTURE) ×2 IMPLANT
SWAB CULTURE AMIES ANAERIB BLU (MISCELLANEOUS) IMPLANT
SYR 10ML LL (SYRINGE) ×6 IMPLANT
UNDERPAD 30X36 HEAVY ABSORB (UNDERPADS AND DIAPERS) ×3 IMPLANT
WATER STERILE IRR 500ML POUR (IV SOLUTION) ×3 IMPLANT
WIRE Z .045 C-WIRE SPADE TIP (WIRE) ×3 IMPLANT
X-FIX MINI ASSEMBLY 55 (Miscellaneous) ×3 IMPLANT

## 2021-05-28 NOTE — Interval H&P Note (Signed)
History and Physical Interval Note:  05/28/2021 2:00 PM  Maria Espinoza  has presented today for surgery, with the diagnosis of PLANTAR FLEX METATARSAL.  The various methods of treatment have been discussed with the patient and family. After consideration of risks, benefits and other options for treatment, the patient has consented to  Procedure(s): METATARSAL OSTEOTOMYM FOURTH TOE RIGHT FOOT (Right) WITH APPLICATION OF EXTERNAL FIXATOR (Right) as a surgical intervention.  The patient's history has been reviewed, patient examined, no change in status, stable for surgery.  I have reviewed the patient's chart and labs.  Questions were answered to the patient's satisfaction.     Felecia Shelling

## 2021-05-28 NOTE — Transfer of Care (Signed)
Immediate Anesthesia Transfer of Care Note  Patient: Maria Espinoza  Procedure(s) Performed: METATARSAL OSTEOTOMY FOURTH TOE RIGHT FOOT (Right: Toe) WITH APPLICATION OF EXTERNAL FIXATOR (Right)  Patient Location: PACU  Anesthesia Type:General  Level of Consciousness: awake, alert  and oriented  Airway & Oxygen Therapy: Patient Spontanous Breathing and Patient connected to face mask oxygen  Post-op Assessment: Report given to RN and Post -op Vital signs reviewed and stable  Post vital signs: Reviewed and stable  Last Vitals:  Vitals Value Taken Time  BP 130/80 05/28/21 1619  Temp    Pulse    Resp    SpO2    Vitals shown include unvalidated device data.  Last Pain:  Vitals:   05/28/21 1401  TempSrc: Tympanic  PainSc: 0-No pain         Complications: No notable events documented.

## 2021-05-28 NOTE — Anesthesia Preprocedure Evaluation (Addendum)
Anesthesia Evaluation  Patient identified by MRN, date of birth, ID band Patient awake    Reviewed: Allergy & Precautions, NPO status , Patient's Chart, lab work & pertinent test results  Airway Mallampati: III  TM Distance: >3 FB Neck ROM: Full    Dental no notable dental hx.    Pulmonary Current Smoker and Patient abstained from smoking.,     + decreased breath sounds      Cardiovascular Exercise Tolerance: Poor Normal cardiovascular exam     Neuro/Psych  Headaches, PSYCHIATRIC DISORDERS Bipolar Disorder    GI/Hepatic negative GI ROS, Neg liver ROS,   Endo/Other  Morbid obesity  Renal/GU negative Renal ROS     Musculoskeletal  (+) Arthritis ,   Abdominal (+) + obese,   Peds  Hematology   Anesthesia Other Findings PLANTAR FLEX METATARSAL  Previous ETT: 01/15/21; 1511 (created via procedure documentation); Grade 1; Endotracheal Tube; 3; Oral; 7 mm; Cuffed, Min.occ.pres.; 1; Stylet; ETCO2 (Capnography), Bilateral Breath Sounds, Direct Visualization; 20 cm. McGraph  Reproductive/Obstetrics negative OB ROS                           Anesthesia Physical Anesthesia Plan  ASA: 3  Anesthesia Plan: General   Post-op Pain Management:    Induction: Intravenous  PONV Risk Score and Plan: 3 and Ondansetron, Dexamethasone and Midazolam  Airway Management Planned: LMA  Additional Equipment:   Intra-op Plan:   Post-operative Plan:   Informed Consent: I have reviewed the patients History and Physical, chart, labs and discussed the procedure including the risks, benefits and alternatives for the proposed anesthesia with the patient or authorized representative who has indicated his/her understanding and acceptance.     Dental advisory given  Plan Discussed with: CRNA and Anesthesiologist  Anesthesia Plan Comments:         Anesthesia Quick Evaluation

## 2021-05-28 NOTE — Op Note (Signed)
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  Osteotomy fourth metatarsal right foot with application of external fixator  Surgeon: Felecia Shelling DPM  Anesthesia: 50-50 mixture of 1% lidocaine plain with 0.5% Marcaine plain totaling 20 mL in the right lower extremity ankle block fashion  Hemostasis: Calf tourniquet inflated to a pressure of after esmarch exsanguination   EBL: Minimal mL Materials: Stryker mini rail external fixator Injectables: None Pathology: None  Condition: The patient tolerated the procedure and anesthesia well. No complications noted or reported   Justification for procedure: The patient is a 30 y.o. female who presents today for surgical correction of brachia metatarsalgia fourth metatarsal right foot. All conservative modalities of been unsuccessful in providing any sort of satisfactory alleviation of symptoms with the patient. The patient was told benefits as well as possible side effects of the surgery. The patient consented for surgical correction. The patient consent form was reviewed. All patient questions were answered. No guarantees were expressed or implied. The patient and the surgeon both signed the patient consent form with the witness present and placed in the patient's chart.   Procedure in Detail: The patient was brought to the operating room, placed in the operating table in the supine position at which time an aseptic scrub and drape were performed about the patient's respective lower extremity after anesthesia was induced as described above. Attention was then directed to the surgical area where procedure number one commenced.  Procedure #1: Fourth metatarsal osteotomy with application of external fixator right foot A 5 cm linear longitudinal skin incision was planned and made overlying the fourth  metatarsal of the right foot.  The incision was carried down to the level of bone with care taken to cut clamp ligate or retract away all small neurovascular structures traversing the incision site.  The fourth metatarsal was identified and placement of the percutaneous self driving Shanz pins for the mini rail external fixator were placed appropriately into the metatarsal.  This was verified by intraoperative x-ray fluoroscopy.  After appropriate placement of the external fixator pins, an osteotomy was performed using a sagittal blade mounted on a sagittal saw.  The external fixator was then applied and compressed slightly.  Again, the external fixator was verified by intraoperative x-ray fluoroscopy and the osteotomy site was also verified which was satisfactory.  The osteotomy site was planned at the metaphyseal diaphyseal junction.  After application of the external fixator attention was directed to the fourth MTPJ of the surgical foot.  A McGlamery elevator was utilized to release the fourth MTPJ of the foot.  This allowed for more mobility at the MTPJ and a 0.045 K wire was driven through the fourth MTPJ to prevent subluxation or dorsal dislocation as the fourth metatarsal was distracted out distally.  The percutaneous portion of the K wire was bent dorsal at a 90 degree angle and cut with a synthetic ball cap placed over the percutaneous portion.  Again, this K wire was verified by intraoperative x-ray fluoroscopy which was satisfactory.  Copious irrigation was then utilized in preparation for primary closure.  4-0 vicryl and 5-0 monocryl was utilized to reapproximate the skin edges around the external fixator pins and reinforced with steristrips.  Dry sterile compressive dressings were then applied to all previously mentioned incision sites about the patient's lower extremity. The tourniquet which was used for hemostasis was  deflated. All normal neurovascular responses including pink color and warmth  returned all the digits of patient's lower extremity.  The patient was then transferred from the operating room to the recovery room having tolerated the procedure and anesthesia well. All vital signs are stable. After a brief stay in the recovery room the patient was discharged with adequate prescriptions for analgesia. Verbal as well as written instructions were provided for the patient regarding wound care. The patient is to keep the dressings clean dry and intact until they are to follow surgeon Dr. Gala Lewandowsky in the office upon discharge.   Felecia Shelling, DPM [facility]  Dr. Felecia Shelling, DPM    [address] [place], [place] [address]                Office [phone]  Fax [phone]

## 2021-05-28 NOTE — Discharge Instructions (Addendum)
Strict nonweight bearing to right foot. Elevate foot. Ice 20/hour for the first 24 hours. Keep dressings clean and dry. Follow up next week in office. Please call for appointment.   AMBULATORY SURGERY  DISCHARGE INSTRUCTIONS   The drugs that you were given will stay in your system until tomorrow so for the next 24 hours you should not:  Drive an automobile Make any legal decisions Drink any alcoholic beverage   You may resume regular meals tomorrow.  Today it is better to start with liquids and gradually work up to solid foods.  You may eat anything you prefer, but it is better to start with liquids, then soup and crackers, and gradually work up to solid foods.   Please notify your doctor immediately if you have any unusual bleeding, trouble breathing, redness and pain at the surgery site, drainage, fever, or pain not relieved by medication.    Additional Instructions:        Please contact your physician with any problems or Same Day Surgery at [phone], Monday through Friday 6 am to 4 pm, or  at [facility] number at [phone].

## 2021-05-28 NOTE — H&P (Signed)
History and Physical Interval Note:  05/28/2021 1:42 PM  Maria Espinoza  has presented today for surgery, with the diagnosis of PLANTAR FLEX METATARSAL.  The various methods of treatment have been discussed with the patient and family. After consideration of risks, benefits and other options for treatment, the patient has consented to  Procedure(s): METATARSAL OSTEOTOMYM FOURTH TOE RIGHT FOOT (Right) WITH APPLICATION OF EXTERNAL FIXATOR (Right) as a surgical intervention.  The patient's history has been reviewed, patient examined, no change in status, stable for surgery.  I have reviewed the patient's chart and labs.  Questions were answered to the patient's satisfaction.     Felecia Shelling

## 2021-05-28 NOTE — Anesthesia Postprocedure Evaluation (Signed)
Anesthesia Post Note  Patient: Maria Espinoza  Procedure(s) Performed: METATARSAL OSTEOTOMY FOURTH TOE RIGHT FOOT (Right: Toe) WITH APPLICATION OF EXTERNAL FIXATOR (Right)  Patient location during evaluation: PACU Anesthesia Type: General Level of consciousness: awake and alert Pain management: pain level controlled Vital Signs Assessment: post-procedure vital signs reviewed and stable Respiratory status: spontaneous breathing, nonlabored ventilation, respiratory function stable and patient connected to nasal cannula oxygen Cardiovascular status: blood pressure returned to baseline and stable Postop Assessment: no apparent nausea or vomiting Anesthetic complications: no   No notable events documented.   Last Vitals:  Vitals:   05/28/21 1757 05/28/21 1913  BP: 135/72 130/82  Pulse: 63 67  Resp: 18 18  Temp:    SpO2: 100% 100%    Last Pain:  Vitals:   05/28/21 1913  TempSrc:   PainSc: 4                  Lenard Simmer

## 2021-05-28 NOTE — Brief Op Note (Signed)
05/28/2021  4:17 PM  PATIENT:  Maria Espinoza  30 y.o. female  PRE-OPERATIVE DIAGNOSIS:  Brachy Metatarsalgia   POST-OPERATIVE DIAGNOSIS:  Brachy Metatarsalgia   PROCEDURE:  Procedure(s): METATARSAL OSTEOTOMY FOURTH TOE RIGHT FOOT (Right) WITH APPLICATION OF EXTERNAL FIXATOR (Right)  SURGEON:  Surgeon(s) and Role:    Felecia Shelling, DPM - Primary  PHYSICIAN ASSISTANT:   ASSISTANTS: none   ANESTHESIA:   local and IV sedation  EBL:  0 mL   BLOOD ADMINISTERED:none  DRAINS: none   LOCAL MEDICATIONS USED:  MARCAINE    and LIDOCAINE   SPECIMEN:  No Specimen  DISPOSITION OF SPECIMEN:  PATHOLOGY  COUNTS:  YES  TOURNIQUET:   Total Tourniquet Time Documented: Calf (Right) - 47 minutes Total: Calf (Right) - 47 minutes   DICTATION: .Reubin Milan Dictation  PLAN OF CARE: Discharge to home after PACU  PATIENT DISPOSITION:  PACU - hemodynamically stable.   Delay start of Pharmacological VTE agent (>24hrs) due to surgical blood loss or risk of bleeding: not applicable

## 2021-05-31 ENCOUNTER — Encounter: Payer: Self-pay | Admitting: Podiatry

## 2021-06-04 ENCOUNTER — Encounter: Payer: Self-pay | Admitting: Podiatry

## 2021-06-04 ENCOUNTER — Encounter: Payer: Medicaid Other | Admitting: Podiatry

## 2021-06-04 ENCOUNTER — Ambulatory Visit (INDEPENDENT_AMBULATORY_CARE_PROVIDER_SITE_OTHER): Payer: Medicaid Other

## 2021-06-04 ENCOUNTER — Ambulatory Visit (INDEPENDENT_AMBULATORY_CARE_PROVIDER_SITE_OTHER): Payer: Medicaid Other | Admitting: Podiatry

## 2021-06-04 ENCOUNTER — Other Ambulatory Visit: Payer: Self-pay

## 2021-06-04 DIAGNOSIS — M216X2 Other acquired deformities of left foot: Secondary | ICD-10-CM

## 2021-06-04 DIAGNOSIS — Z9889 Other specified postprocedural states: Secondary | ICD-10-CM

## 2021-06-14 ENCOUNTER — Telehealth: Payer: Self-pay | Admitting: Podiatry

## 2021-06-14 NOTE — Telephone Encounter (Signed)
Call and ask if she can have a refill on pain meds

## 2021-06-15 ENCOUNTER — Encounter: Payer: Medicaid Other | Admitting: Podiatry

## 2021-06-15 ENCOUNTER — Other Ambulatory Visit: Payer: Self-pay | Admitting: Podiatry

## 2021-06-15 MED ORDER — OXYCODONE-ACETAMINOPHEN 5-325 MG PO TABS: 1.0000 | ORAL_TABLET | ORAL | 0 refills | Status: DC | PRN

## 2021-06-15 NOTE — Telephone Encounter (Signed)
Rx sent. - Dr. Keshanna Riso

## 2021-06-15 NOTE — Progress Notes (Signed)
   Subjective:  Patient presents today status post fourth metatarsal osteotomy with application of external fixator right foot. DOS: 05/28/2021.  Patient states that she is having pain 7/10.  Over the past week the pain has improved somewhat.  She is taking pain medication as prescribed and nonweightbearing.  She presents for further treatment evaluation  Past Medical History:  Diagnosis Date   Anemia    Anxiety    Bipolar depression (HCC)    Depression    Difficult intubation    Dizziness    Migraines    Nausea    Rectal bleeding       Objective/Physical Exam Neurovascular status intact.  Skin incisions appear to be well coapted with sutures and staples intact. No sign of infectious process noted. No dehiscence. No active bleeding noted. Moderate edema noted to the surgical extremity.  Radiographic Exam:  Osteotomy noted to the metaphysis of the proximal portion of the fourth metatarsal right foot.  Percutaneous K wire intact crossing the MTP joint.  External fixator intact.  The distal pins are not quite parallel with the proximal pins and there is some slight diastases within the osteotomy site.  Overall the alignment is good and the hardware is intact  Assessment: 1. s/p fourth metatarsal osteotomy with application of external fixator right. DOS: 05/28/2021   Plan of Care:  1. Patient was evaluated. X-rays reviewed 2.  Continue nonweightbearing right lower extremity 3.  Patient may begin distraction 1 mm/day.  Quarter turn 4 times per day 4.  Continue pain medicine as prescribed. 5.  Return to clinic in 2 weeks  Felecia Shelling, DPM [facility]  Dr. Felecia Shelling, DPM    [address] [place], [place] [address]                Office [phone]  Fax [phone]

## 2021-06-18 ENCOUNTER — Ambulatory Visit (INDEPENDENT_AMBULATORY_CARE_PROVIDER_SITE_OTHER): Payer: Medicaid Other

## 2021-06-18 ENCOUNTER — Other Ambulatory Visit: Payer: Self-pay

## 2021-06-18 ENCOUNTER — Ambulatory Visit (INDEPENDENT_AMBULATORY_CARE_PROVIDER_SITE_OTHER): Payer: Medicaid Other | Admitting: Podiatry

## 2021-06-18 ENCOUNTER — Encounter: Payer: Self-pay | Admitting: Podiatry

## 2021-06-18 VITALS — Temp 97.5°F

## 2021-06-18 DIAGNOSIS — M216X1 Other acquired deformities of right foot: Secondary | ICD-10-CM | POA: Diagnosis not present

## 2021-06-18 DIAGNOSIS — Z9889 Other specified postprocedural states: Secondary | ICD-10-CM

## 2021-06-18 DIAGNOSIS — L03119 Cellulitis of unspecified part of limb: Secondary | ICD-10-CM

## 2021-06-18 DIAGNOSIS — L02619 Cutaneous abscess of unspecified foot: Secondary | ICD-10-CM

## 2021-06-18 MED ORDER — SULFAMETHOXAZOLE-TRIMETHOPRIM 800-160 MG PO TABS: 1.0000 | ORAL_TABLET | Freq: Two times a day (BID) | ORAL | 0 refills | Status: DC

## 2021-06-18 MED ORDER — OXYCODONE-ACETAMINOPHEN 5-325 MG PO TABS: 1.0000 | ORAL_TABLET | ORAL | 0 refills | Status: DC | PRN

## 2021-06-18 NOTE — Addendum Note (Signed)
Addended by: Enedina Finner on: 06/18/2021 11:33 AM   Modules accepted: Orders

## 2021-06-18 NOTE — Progress Notes (Signed)
   Subjective:  Patient presents today status post fourth metatarsal osteotomy with application of external fixator right foot. DOS: 05/28/2021.  Patient states that she is having pain 7/10.  Over the past week the pain has improved somewhat.  She is taking pain medication as prescribed and nonweightbearing.  She presents for further treatment evaluation  Past Medical History:  Diagnosis Date   Anemia    Anxiety    Bipolar depression (HCC)    Depression    Difficult intubation    Dizziness    Migraines    Nausea    Rectal bleeding       Objective/Physical Exam Neurovascular status intact.  Distraction between the percutaneous pins appears to have created some dehiscence along the incision site.  Serous drainage noted.  No active bleeding noted.  Heavy edema noted to the surgical foot with some erythema.  Radiographic Exam:  Osteotomy of the fourth metatarsal was distracted nicely about 1 cm.  On lateral view there is some demonstration of callus formation between the osteotomy site which is promising.  Good alignment of the fourth ray.  Percutaneous fixation pin is intact. Soft tissue edema noted diffusely around the foot.  No radiolucencies or gas within the tissue.  Assessment: 1. s/p fourth metatarsal osteotomy with application of external fixator right. DOS: 05/28/2021 2.  Postoperative cellulitis/infection right foot   Plan of Care:  1. Patient was evaluated. X-rays reviewed.  Cultures taken of the drainage and sent to pathology 2.  Continue nonweightbearing right lower extremity 3.  For now we are going to hold off on distraction.  Once the infection is resolved we may resume distraction at 1 mm/day. 4.  Continue pain medicine as prescribed.  Patient has had an increase in pain.  Refill prescription for Percocet 5/3 2 5  mg every 4 hours as needed 5.  Prescription for Bactrim DS #20 twice daily.  I advised the patient that if she does not begin to improve over the next 24-48  hours with the Bactrim DS that she goes to the emergency department for postoperative infection and IV antibiotics 6.  Return to clinic in 2 weeks  Felecia Shelling, DPM [facility]  Dr. Felecia Shelling, DPM    [address] [place], [place] [address]                Office [phone]  Fax [phone]

## 2021-06-24 LAB — WOUND CULTURE

## 2021-06-25 ENCOUNTER — Other Ambulatory Visit: Payer: Self-pay | Admitting: Podiatry

## 2021-06-25 MED ORDER — OXYCODONE-ACETAMINOPHEN 5-325 MG PO TABS: 1.0000 | ORAL_TABLET | ORAL | 0 refills | Status: DC | PRN

## 2021-06-25 NOTE — Telephone Encounter (Signed)
Spoke with patient on the phone.  Cultures were reviewed.  She is feeling much better.  She is still taking the Bactrim DS that was prescribed.  She says it is helping significantly with the redness and swelling.  Refill prescription for Percocet 5/325 mg for the pain.  She has an appointment coming up on 07/02/2021.  We will follow-up in the office at that time.  Felecia Shelling, DPM [facility]  Dr. Felecia Shelling, DPM    [address] [place], [place] [address]                Office [phone]  Fax [phone]

## 2021-06-28 ENCOUNTER — Telehealth: Payer: Self-pay | Admitting: Podiatry

## 2021-06-28 NOTE — Telephone Encounter (Signed)
Patient called stating she wanted to know if she can get a note written that she has been out of work and not able to move or be weight bearing. Patient states she is trying to apply for disability.

## 2021-06-28 NOTE — Telephone Encounter (Signed)
Okay to provide a note.  She can come pick it up in the office anytime.  Thanks, Dr. Logan Bores

## 2021-06-29 ENCOUNTER — Encounter: Payer: Medicaid Other | Admitting: Podiatry

## 2021-06-29 NOTE — Telephone Encounter (Signed)
Called pt let her know. Patient states she has appt next Tuesday she will get it then.

## 2021-07-01 ENCOUNTER — Other Ambulatory Visit: Payer: Self-pay | Admitting: Podiatry

## 2021-07-01 ENCOUNTER — Telehealth: Payer: Self-pay | Admitting: Podiatry

## 2021-07-01 MED ORDER — OXYCODONE-ACETAMINOPHEN 5-325 MG PO TABS: 1.0000 | ORAL_TABLET | ORAL | 0 refills | Status: DC | PRN

## 2021-07-01 NOTE — Telephone Encounter (Signed)
Patient called she would like a refill of her pain medication. Pt uses [facility].

## 2021-07-01 NOTE — Telephone Encounter (Signed)
Sent!

## 2021-07-01 NOTE — Telephone Encounter (Signed)
Called pt let her know RX was sent to pharmacy per Dr Logan Bores.

## 2021-07-02 ENCOUNTER — Encounter: Payer: Medicaid Other | Admitting: Podiatry

## 2021-07-06 ENCOUNTER — Encounter: Payer: Medicaid Other | Admitting: Podiatry

## 2021-07-08 ENCOUNTER — Other Ambulatory Visit: Payer: Self-pay | Admitting: Podiatry

## 2021-07-08 ENCOUNTER — Telehealth: Payer: Self-pay | Admitting: Podiatry

## 2021-07-08 MED ORDER — OXYCODONE-ACETAMINOPHEN 5-325 MG PO TABS
1.0000 | ORAL_TABLET | Freq: Three times a day (TID) | ORAL | 0 refills | Status: DC | PRN
Start: 2021-07-08 — End: 2021-07-20

## 2021-07-08 NOTE — Telephone Encounter (Signed)
Patient called wouyld like refill of pain medication sent in.

## 2021-07-08 NOTE — Telephone Encounter (Signed)
Prescription sent.  Thanks, Dr. Izela Altier

## 2021-07-08 NOTE — Telephone Encounter (Signed)
Called pt let her know RX was sent over to pharmacy °

## 2021-07-09 ENCOUNTER — Other Ambulatory Visit: Payer: Self-pay

## 2021-07-09 ENCOUNTER — Ambulatory Visit: Payer: Medicaid Other

## 2021-07-09 ENCOUNTER — Ambulatory Visit (INDEPENDENT_AMBULATORY_CARE_PROVIDER_SITE_OTHER): Payer: Medicaid Other | Admitting: Podiatry

## 2021-07-09 DIAGNOSIS — L03119 Cellulitis of unspecified part of limb: Secondary | ICD-10-CM

## 2021-07-09 DIAGNOSIS — Z9889 Other specified postprocedural states: Secondary | ICD-10-CM

## 2021-07-09 DIAGNOSIS — L02619 Cutaneous abscess of unspecified foot: Secondary | ICD-10-CM

## 2021-07-09 MED ORDER — CIPROFLOXACIN HCL 500 MG PO TABS: 500.0000 mg | ORAL_TABLET | Freq: Two times a day (BID) | ORAL | 0 refills | Status: AC

## 2021-07-09 NOTE — H&P (View-Only) (Signed)
° °  Subjective:  Patient presents today status post fourth metatarsal osteotomy with application of external fixator right foot. DOS: 05/28/2021.  Patient continues to have pain and tenderness associated to the surgical foot.  She is taking pain medication as prescribed and nonweightbearing.  Patient last seen in the office on 06/18/2021.  And at that time there was some concern for postoperative cellulitis of infection and bactrim DS was prescribed. Patient has been taking the antibiotic as directed. She presents for further treatment and evaluation.   Past Medical History:  Diagnosis Date   Anemia    Anxiety    Bipolar depression (HCC)    Depression    Difficult intubation    Dizziness    Migraines    Nausea    Rectal bleeding        Objective/Physical Exam Neurovascular status intact.  There continues to be edema associated around the surgical foot.  No malodor.  Moderate drainage noted. Dehiscence along the incision noted. The 4th toe has distracted out however is not completely out to length.   Radiographic Exam:  Distraction of the 4th metatarsal noted with external minirail fixator stable and intact. No gas within the tissues. There is minimal callus formation along the area of distraction. Overall decent alignment of the fourth ray.   Assessment: 1. s/p fourth metatarsal osteotomy with application of external fixator right. DOS: 05/28/2021 2.  Postoperative cellulitis/infection right foot   Plan of Care:  1. Patient was evaluated. X-rays reviewed.  New cultures taken of the drainage and sent to pathology.  Last cultures were reviewed today.  Prescription for ciprofloxacin 500 mg 2 times daily based on last wound cultures 2.  Continue nonweightbearing right lower extremity 3.  Weighing the benefits and disadvantages of the hardware creating stability to the osteotomy site, I do believe at this point that removal of the external fixator is appropriate since the patient is not  improving with the infection despite oral antibiotics.  We will go ahead and schedule removal of the external fixator in the OR setting under anesthesia 4.  Authorization for surgery was initiated today.  Surgery will consist of removal of mini rail external fixator left 5.  Refill prescription for Percocet 5/3 2 5  mg 6.  Return to clinic 1 week postop  Felecia Shelling, DPM [facility]  Dr. Felecia Shelling, DPM    [address] [place], [place] [address]                Office [phone]  Fax [phone]

## 2021-07-09 NOTE — Progress Notes (Signed)
° °  Subjective:  Patient presents today status post fourth metatarsal osteotomy with application of external fixator right foot. DOS: 05/28/2021.  Patient continues to have pain and tenderness associated to the surgical foot.  She is taking pain medication as prescribed and nonweightbearing.  Patient last seen in the office on 06/18/2021.  And at that time there was some concern for postoperative cellulitis of infection and bactrim DS was prescribed. Patient has been taking the antibiotic as directed. She presents for further treatment and evaluation.   Past Medical History:  Diagnosis Date   Anemia    Anxiety    Bipolar depression (HCC)    Depression    Difficult intubation    Dizziness    Migraines    Nausea    Rectal bleeding        Objective/Physical Exam Neurovascular status intact.  There continues to be edema associated around the surgical foot.  No malodor.  Moderate drainage noted. Dehiscence along the incision noted. The 4th toe has distracted out however is not completely out to length.   Radiographic Exam:  Distraction of the 4th metatarsal noted with external minirail fixator stable and intact. No gas within the tissues. There is minimal callus formation along the area of distraction. Overall decent alignment of the fourth ray.   Assessment: 1. s/p fourth metatarsal osteotomy with application of external fixator right. DOS: 05/28/2021 2.  Postoperative cellulitis/infection right foot   Plan of Care:  1. Patient was evaluated. X-rays reviewed.  New cultures taken of the drainage and sent to pathology.  Last cultures were reviewed today.  Prescription for ciprofloxacin 500 mg 2 times daily based on last wound cultures 2.  Continue nonweightbearing right lower extremity 3.  Weighing the benefits and disadvantages of the hardware creating stability to the osteotomy site, I do believe at this point that removal of the external fixator is appropriate since the patient is not  improving with the infection despite oral antibiotics.  We will go ahead and schedule removal of the external fixator in the OR setting under anesthesia 4.  Authorization for surgery was initiated today.  Surgery will consist of removal of mini rail external fixator left 5.  Refill prescription for Percocet 5/3 2 5  mg 6.  Return to clinic 1 week postop  Felecia Shelling, DPM [facility]  Dr. Felecia Shelling, DPM    [address] [place], [place] [address]                Office [phone]  Fax [phone]

## 2021-07-17 LAB — WOUND CULTURE: Organism ID, Bacteria: NONE SEEN

## 2021-07-20 ENCOUNTER — Other Ambulatory Visit: Payer: Self-pay | Admitting: Podiatry

## 2021-07-20 ENCOUNTER — Telehealth: Payer: Self-pay | Admitting: Podiatry

## 2021-07-20 MED ORDER — OXYCODONE-ACETAMINOPHEN 5-325 MG PO TABS: 1.0000 | ORAL_TABLET | Freq: Three times a day (TID) | ORAL | 0 refills | Status: DC | PRN

## 2021-07-20 NOTE — Telephone Encounter (Signed)
Patient can come in and get the H&P papers to take to her PCP. Refill sent. - Dr. Logan Bores

## 2021-07-20 NOTE — Telephone Encounter (Signed)
Pt call she needs H&P  papers  and she also needs a refill on pain meds

## 2021-07-21 ENCOUNTER — Encounter: Payer: Self-pay | Admitting: Internal Medicine

## 2021-07-21 ENCOUNTER — Encounter
Admission: RE | Admit: 2021-07-21 | Discharge: 2021-07-21 | Disposition: A | Discharge status: Home / Self Care | Payer: Medicaid Other | Source: Ambulatory Visit | Attending: Podiatry | Admitting: Podiatry

## 2021-07-21 ENCOUNTER — Other Ambulatory Visit: Payer: Self-pay

## 2021-07-21 ENCOUNTER — Ambulatory Visit (INDEPENDENT_AMBULATORY_CARE_PROVIDER_SITE_OTHER): Payer: Medicaid Other | Admitting: Internal Medicine

## 2021-07-21 VITALS — BP 102/69 | HR 80 | Ht 63.0 in | Wt 306.8 lb

## 2021-07-21 DIAGNOSIS — Z6841 Body Mass Index (BMI) 40.0 and over, adult: Secondary | ICD-10-CM | POA: Diagnosis not present

## 2021-07-21 DIAGNOSIS — Z01818 Encounter for other preprocedural examination: Secondary | ICD-10-CM | POA: Diagnosis not present

## 2021-07-21 DIAGNOSIS — F418 Other specified anxiety disorders: Secondary | ICD-10-CM

## 2021-07-21 NOTE — Patient Instructions (Addendum)
Your procedure is scheduled on:07-23-21 Friday Report to the Registration Desk on the 1st floor of the Medical Mall.Then proceed to the 2nd floor Surgery Desk in the Medical Mall To find out your arrival time, please call [phone] between 1PM - 3PM on:07-22-21 Thursday  REMEMBER: Instructions that are not followed completely may result in serious medical risk, up to and including death; or upon the discretion of your surgeon and anesthesiologist your surgery may need to be rescheduled.  Do not eat food after midnight the night before surgery.  No gum chewing, lozengers or hard candies.  You may however, drink CLEAR liquids up to 2 hours before you are scheduled to arrive for your surgery. Do not drink anything within 2 hours of your scheduled arrival time.  Clear liquids include: - water  - apple juice without pulp - gatorade (not RED, PURPLE, OR BLUE) - black coffee or tea (Do NOT add milk or creamers to the coffee or tea) Do NOT drink anything that is not on this list.  TAKE THESE MEDICATIONS THE MORNING OF SURGERY WITH A SIP OF WATER: -sertraline (ZOLOFT)  -you may take oxyCODONE-acetaminophen (PERCOCET) for pain if needed  One week prior to surgery: Stop Anti-inflammatories (NSAIDS) such as Advil, Aleve, Ibuprofen, Motrin, Naproxen, Naprosyn and Aspirin based products such as Excedrin, Goodys Powder, BC Powder.You may however, take Tylenol/Percocet if needed for pain up until the day of surgery.  Stop ANY OVER THE COUNTER supplements/vitamins NOW (07-21-21) until after surgery  No Alcohol for 24 hours before or after surgery.  No Smoking including e-cigarettes for 24 hours prior to surgery.  No chewable tobacco products for at least 6 hours prior to surgery.  No nicotine patches on the day of surgery.  Do not use any "recreational" drugs for at least a week prior to your surgery.  Please be advised that the combination of cocaine and anesthesia may have negative outcomes, up to  and including death. If you test positive for cocaine, your surgery will be cancelled.  On the morning of surgery brush your teeth with toothpaste and water, you may rinse your mouth with mouthwash if you wish. Do not swallow any toothpaste or mouthwash.  Use CHG Soap as directed on instruction sheet.  Do not wear jewelry, make-up, hairpins, clips or nail polish.  Do not wear lotions, powders, or perfumes.   Do not shave body from the neck down 48 hours prior to surgery just in case you cut yourself which could leave a site for infection.  Also, freshly shaved skin may become irritated if using the CHG soap.  Contact lenses, hearing aids and dentures may not be worn into surgery.  Do not bring valuables to the hospital. The [facility] is not responsible for any missing/lost belongings or valuables.   Notify your doctor if there is any change in your medical condition (cold, fever, infection).  Wear comfortable clothing (specific to your surgery type) to the hospital.  After surgery, you can help prevent lung complications by doing breathing exercises.  Take deep breaths and cough every 1-2 hours. Your doctor may order a device called an Incentive Spirometer to help you take deep breaths. When coughing or sneezing, hold a pillow firmly against your incision with both hands. This is called splinting. Doing this helps protect your incision. It also decreases belly discomfort.  If you are being admitted to the hospital overnight, leave your suitcase in the car. After surgery it may be brought to your room.  If you are being discharged the day of surgery, you will not be allowed to drive home. You will need a responsible adult (18 years or older) to drive you home and stay with you that night.   If you are taking public transportation, you will need to have a responsible adult (18 years or older) with you. Please confirm with your physician that it is acceptable to use public  transportation.   Please call the Pre-admissions Testing Dept. at [phone] if you have any questions about these instructions.  Surgery Visitation Policy:  Patients undergoing a surgery or procedure may have one family member or support person with them as long as that person is not COVID-19 positive or experiencing its symptoms.  That person may remain in the waiting area during the procedure and may rotate out with other people.  Inpatient Visitation:    Visiting hours are 7 a.m. to 8 p.m. Up to two visitors ages 16+ are allowed at one time in a patient room. The visitors may rotate out with other people during the day. Visitors must check out when they leave, or other visitors will not be allowed. One designated support person may remain overnight. The visitor must pass COVID-19 screenings, use hand sanitizer when entering and exiting the patients room and wear a mask at all times, including in the patients room. Patients must also wear a mask when staff or their visitor are in the room. Masking is required regardless of vaccination status.

## 2021-07-21 NOTE — Progress Notes (Signed)
Established Patient Office Visit  Subjective:  Patient ID: Maria Espinoza, female    DOB: Jul 17, 1991  Age: 31 y.o. MRN: [id number]  CC:  Chief Complaint  Patient presents with   Pre-op Exam    Patient is here for surgical clearance for hardware removal of her right foot.    HPI  Maria Espinoza presents for surgical clearance.  Patient has hardware on the right foot needto be  removed, she denies any chest pain shortness of breath.  Respiratory infection or kidney infection.   Past Medical History:  Diagnosis Date   Anemia    Anxiety    Bipolar depression (HCC)    Depression    Difficult intubation    x 1 with foot surgery   Dizziness    Migraines    Nausea    Rectal bleeding     Past Surgical History:  Procedure Laterality Date   HARDWARE REMOVAL Left 05/14/2021   Procedure: LEFT FOOT HARDWARE REMOVAL;  Surgeon: Felecia Shelling, DPM;  Location: [facility] OR[facility];  Service: Podiatry;  Laterality: Left;   METATARSAL OSTEOTOMY Left 01/15/2021   Procedure: METATARSAL OSTEOTOMY;  Surgeon: Felecia Shelling, DPM;  Location: [facility];  Service: Podiatry;  Laterality: Left;   METATARSAL OSTEOTOMY Right 05/28/2021   Procedure: METATARSAL OSTEOTOMY FOURTH TOE RIGHT FOOT;  Surgeon: Felecia Shelling, DPM;  Location: [facility];  Service: Podiatry;  Laterality: Right;   TONSILLECTOMY     age 86   WISDOM TOOTH EXTRACTION      Family History  Problem Relation Age of Onset   Migraines Father    Heart disease Maternal Grandmother    Depression Maternal Grandmother    Hypertension Maternal Grandmother    Breast cancer Paternal Grandfather     Social History   Socioeconomic History   Marital status: Single    Spouse name: Not on file   Number of children: 3   Years of education: Not on file   Highest education level: Not on file  Occupational History   Not on file  Tobacco Use   Smoking status: Some Days    Packs/day: 0.50    Years: 3.00    Pack years: 1.50    Types: Cigarettes    Smokeless tobacco: Never  Vaping Use   Vaping Use: Never used  Substance and Sexual Activity   Alcohol use: Not Currently    Comment: occasional   Drug use: Never   Sexual activity: Not Currently    Partners: Male    Birth control/protection: Abstinence  Other Topics Concern   Not on file  Social History Narrative   Not on file   Social Determinants of Health   Financial Resource Strain: Not on file  Food Insecurity: Not on file  Transportation Needs: Not on file  Physical Activity: Not on file  Stress: Not on file  Social Connections: Not on file  Intimate Partner Violence: Not At Risk   Fear of Current or Ex-Partner: No   Emotionally Abused: No   Physically Abused: No   Sexually Abused: No     Current Outpatient Medications:    ciprofloxacin (CIPRO) 500 MG tablet, Take 500 mg by mouth 2 (two) times daily., Disp: , Rfl:    ibuprofen (ADVIL) 200 MG tablet, Take 400 mg by mouth every 8 (eight) hours as needed for moderate pain., Disp: , Rfl:    oxyCODONE-acetaminophen (PERCOCET) 5-325 MG tablet, Take 1 tablet by mouth every 8 (eight) hours as needed for severe pain.,  Disp: 30 tablet, Rfl: 0   sertraline (ZOLOFT) 100 MG tablet, Take 100 mg by mouth every morning., Disp: , Rfl:    traZODone (DESYREL) 150 MG tablet, Take 150 mg by mouth at bedtime as needed for sleep., Disp: , Rfl:    VRAYLAR 3 MG capsule, Take 3 mg by mouth at bedtime., Disp: , Rfl:    Multiple Vitamin (MULTIVITAMIN WITH MINERALS) TABS tablet, Take 1 tablet by mouth daily. (Patient not taking: Reported on 07/21/2021), Disp: , Rfl:    Allergies  Allergen Reactions   Latex Rash    ROS Review of Systems  Constitutional: Negative.   HENT: Negative.    Eyes: Negative.   Respiratory: Negative.    Cardiovascular: Negative.   Gastrointestinal: Negative.   Endocrine: Negative.   Genitourinary: Negative.   Musculoskeletal: Negative.   Skin: Negative.   Allergic/Immunologic: Negative.   Neurological:  Negative.   Hematological: Negative.   Psychiatric/Behavioral: Negative.    All other systems reviewed and are negative.    Objective:    Physical Exam Vitals reviewed.  Constitutional:      Appearance: Normal appearance.  HENT:     Mouth/Throat:     Mouth: Mucous membranes are moist.  Eyes:     Pupils: Pupils are equal, round, and reactive to light.  Neck:     Vascular: No carotid bruit.  Cardiovascular:     Rate and Rhythm: Normal rate and regular rhythm.     Pulses: Normal pulses.     Heart sounds: Normal heart sounds.  Pulmonary:     Effort: Pulmonary effort is normal.     Breath sounds: Normal breath sounds.  Abdominal:     General: Bowel sounds are normal.     Palpations: Abdomen is soft. There is no hepatomegaly, splenomegaly or mass.     Tenderness: There is no abdominal tenderness.     Hernia: No hernia is present.  Musculoskeletal:        General: No tenderness.     Cervical back: Neck supple.     Right lower leg: No edema.     Left lower leg: No edema.     Comments: Patient has hardware in the right foot  Skin:    Findings: No rash.  Neurological:     Mental Status: She is alert and oriented to person, place, and time.     Motor: No weakness.  Psychiatric:        Mood and Affect: Mood and affect normal.        Behavior: Behavior normal.    BP 102/69    Pulse 80    Ht 5\' 3"  (1.6 m)    Wt (!) 306 lb 12.8 oz (139.2 kg)    LMP 07/14/2021 (Exact Date)    BMI 54.35 kg/m  Wt Readings from Last 3 Encounters:  07/21/21 (!) 306 lb 12.8 oz (139.2 kg)  05/28/21 295 lb (133.8 kg)  05/14/21 (!) 302 lb 4 oz (137.1 kg)     Health Maintenance Due  Topic Date Due   COVID-19 Vaccine (1) Never done   Pneumococcal Vaccine 73-38 Years old (1 - PCV) Never done   INFLUENZA VACCINE  02/15/2021    There are no preventive care reminders to display for this patient.  Lab Results  Component Value Date   TSH 3.614 10/03/2018   Lab Results  Component Value Date    WBC 7.3 09/30/2018   HGB 13.2 09/30/2018   HCT 40.5 09/30/2018   MCV  89.2 09/30/2018   PLT 336 09/30/2018   Lab Results  Component Value Date   NA 139 05/28/2021   K 4.0 05/28/2021   CO2 23 05/28/2021   GLUCOSE 95 05/28/2021   BUN 14 05/28/2021   CREATININE 0.68 05/28/2021   BILITOT 0.7 10/01/2018   ALKPHOS 61 10/01/2018   AST 18 10/01/2018   ALT 12 10/01/2018   PROT 6.2 (L) 10/01/2018   ALBUMIN 3.1 (L) 10/01/2018   CALCIUM 8.9 05/28/2021   ANIONGAP 7 05/28/2021   Lab Results  Component Value Date   CHOL 134 10/03/2018   Lab Results  Component Value Date   HDL 38 (L) 10/03/2018   Lab Results  Component Value Date   LDLCALC 83 10/03/2018   Lab Results  Component Value Date   TRIG 63 10/03/2018   Lab Results  Component Value Date   CHOLHDL 3.5 10/03/2018   Lab Results  Component Value Date   HGBA1C 5.3 10/03/2018      Assessment & Plan:   Patient was approved for surgery on the right foot  for removal Of the hardware  No orders of the defined types were placed in this encounter.   Follow-up: No follow-ups on file.    Corky DownsJaved Kimara Bencomo, MD

## 2021-07-21 NOTE — Assessment & Plan Note (Signed)
Anxiety and depression is stable

## 2021-07-23 ENCOUNTER — Ambulatory Visit: Payer: Medicaid Other

## 2021-07-23 ENCOUNTER — Encounter: Payer: Self-pay | Admitting: Podiatry

## 2021-07-23 ENCOUNTER — Ambulatory Visit: Payer: Medicaid Other | Admitting: Anesthesiology

## 2021-07-23 ENCOUNTER — Other Ambulatory Visit: Payer: Self-pay

## 2021-07-23 ENCOUNTER — Encounter
Admission: RE | Disposition: A | Discharge status: Home / Self Care | Payer: Self-pay | Source: Home / Self Care | Attending: Podiatry

## 2021-07-23 ENCOUNTER — Ambulatory Visit
Admission: RE | Admit: 2021-07-23 | Discharge: 2021-07-23 | Disposition: A | Discharge status: Home / Self Care | Payer: Medicaid Other | Attending: Podiatry | Admitting: Podiatry

## 2021-07-23 DIAGNOSIS — T8484XA Pain due to internal orthopedic prosthetic devices, implants and grafts, initial encounter: Secondary | ICD-10-CM | POA: Insufficient documentation

## 2021-07-23 DIAGNOSIS — Y798 Miscellaneous orthopedic devices associated with adverse incidents, not elsewhere classified: Secondary | ICD-10-CM | POA: Diagnosis not present

## 2021-07-23 DIAGNOSIS — Q666 Other congenital valgus deformities of feet: Secondary | ICD-10-CM | POA: Diagnosis not present

## 2021-07-23 DIAGNOSIS — F17200 Nicotine dependence, unspecified, uncomplicated: Secondary | ICD-10-CM | POA: Diagnosis not present

## 2021-07-23 DIAGNOSIS — Z4889 Encounter for other specified surgical aftercare: Secondary | ICD-10-CM | POA: Diagnosis not present

## 2021-07-23 HISTORY — PX: HARDWARE REMOVAL: SHX979

## 2021-07-23 LAB — POCT PREGNANCY, URINE: Preg Test, Ur: NEGATIVE

## 2021-07-23 SURGERY — REMOVAL, HARDWARE
Anesthesia: General | Laterality: Right

## 2021-07-23 MED ORDER — FENTANYL CITRATE (PF) 100 MCG/2ML IJ SOLN
INTRAMUSCULAR | Status: DC | PRN
  Administered 2021-07-23: 25 ug via INTRAVENOUS

## 2021-07-23 MED ORDER — FENTANYL CITRATE (PF) 100 MCG/2ML IJ SOLN
INTRAMUSCULAR | Status: AC
  Filled 2021-07-23: qty 2

## 2021-07-23 MED ORDER — MIDAZOLAM HCL 2 MG/2ML IJ SOLN
INTRAMUSCULAR | Status: DC | PRN
  Administered 2021-07-23: 2 mg via INTRAVENOUS

## 2021-07-23 MED ORDER — PROPOFOL 10 MG/ML IV BOLUS
INTRAVENOUS | Status: AC
  Filled 2021-07-23: qty 40

## 2021-07-23 MED ORDER — FENTANYL CITRATE (PF) 100 MCG/2ML IJ SOLN: 25.0000 ug | INTRAMUSCULAR | Status: DC | PRN

## 2021-07-23 MED ORDER — 0.9 % SODIUM CHLORIDE (POUR BTL) OPTIME
TOPICAL | Status: DC | PRN
  Administered 2021-07-23: 50 mL

## 2021-07-23 MED ORDER — ORAL CARE MOUTH RINSE: 15.0000 mL | Freq: Once | OROMUCOSAL | Status: AC

## 2021-07-23 MED ORDER — PROPOFOL 500 MG/50ML IV EMUL
INTRAVENOUS | Status: DC | PRN
  Administered 2021-07-23: 200 ug/kg/min via INTRAVENOUS
  Administered 2021-07-23: 150 ug/kg/min via INTRAVENOUS

## 2021-07-23 MED ORDER — PROPOFOL 10 MG/ML IV BOLUS
INTRAVENOUS | Status: DC | PRN
Start: 2021-07-23 — End: 2021-07-23
  Administered 2021-07-23: 50 mg via INTRAVENOUS

## 2021-07-23 MED ORDER — LIDOCAINE HCL (PF) 1 % IJ SOLN
INTRAMUSCULAR | Status: AC
  Filled 2021-07-23: qty 30

## 2021-07-23 MED ORDER — FAMOTIDINE 20 MG PO TABS: 20.0000 mg | ORAL_TABLET | Freq: Once | ORAL | Status: DC

## 2021-07-23 MED ORDER — KETOROLAC TROMETHAMINE 30 MG/ML IJ SOLN
INTRAMUSCULAR | Status: DC | PRN
  Administered 2021-07-23: 30 mg via INTRAVENOUS

## 2021-07-23 MED ORDER — FAMOTIDINE 20 MG PO TABS
ORAL_TABLET | ORAL | Status: AC
  Filled 2021-07-23: qty 1

## 2021-07-23 MED ORDER — CEFAZOLIN IN SODIUM CHLORIDE 3-0.9 GM/100ML-% IV SOLN
3.0000 g | INTRAVENOUS | Status: AC
  Administered 2021-07-23: 3 g via INTRAVENOUS
  Filled 2021-07-23 (×3): qty 100

## 2021-07-23 MED ORDER — OXYCODONE-ACETAMINOPHEN 5-325 MG PO TABS: 1.0000 | ORAL_TABLET | ORAL | 0 refills | Status: DC | PRN

## 2021-07-23 MED ORDER — OXYCODONE HCL 5 MG PO TABS: 5.0000 mg | ORAL_TABLET | Freq: Once | ORAL | Status: DC | PRN

## 2021-07-23 MED ORDER — BUPIVACAINE HCL (PF) 0.5 % IJ SOLN
INTRAMUSCULAR | Status: AC
  Filled 2021-07-23: qty 30

## 2021-07-23 MED ORDER — DEXMEDETOMIDINE HCL IN NACL 200 MCG/50ML IV SOLN
INTRAVENOUS | Status: DC | PRN
  Administered 2021-07-23: 4 ug via INTRAVENOUS
  Administered 2021-07-23 (×2): 8 ug via INTRAVENOUS

## 2021-07-23 MED ORDER — MIDAZOLAM HCL 2 MG/2ML IJ SOLN
INTRAMUSCULAR | Status: AC
  Filled 2021-07-23: qty 2

## 2021-07-23 MED ORDER — LIDOCAINE HCL 1 % IJ SOLN
INTRAMUSCULAR | Status: DC | PRN
  Administered 2021-07-23: 20 mL

## 2021-07-23 MED ORDER — KETOROLAC TROMETHAMINE 30 MG/ML IJ SOLN
INTRAMUSCULAR | Status: AC
  Filled 2021-07-23: qty 1

## 2021-07-23 MED ORDER — CHLORHEXIDINE GLUCONATE 0.12 % MT SOLN: 15.0000 mL | Freq: Once | OROMUCOSAL | Status: AC

## 2021-07-23 MED ORDER — LACTATED RINGERS IV SOLN: INTRAVENOUS | Status: DC

## 2021-07-23 MED ORDER — CHLORHEXIDINE GLUCONATE 0.12 % MT SOLN
OROMUCOSAL | Status: AC
  Administered 2021-07-23: 15 mL via OROMUCOSAL
  Filled 2021-07-23: qty 15

## 2021-07-23 MED ORDER — OXYCODONE HCL 5 MG/5ML PO SOLN: 5.0000 mg | Freq: Once | ORAL | Status: DC | PRN

## 2021-07-23 SURGICAL SUPPLY — 56 items
BNDG COHESIVE 4X5 TAN ST LF (GAUZE/BANDAGES/DRESSINGS) ×2 IMPLANT
BNDG COHESIVE 6X5 TAN ST LF (GAUZE/BANDAGES/DRESSINGS) ×2 IMPLANT
BNDG CONFORM 2 STRL LF (GAUZE/BANDAGES/DRESSINGS) ×2 IMPLANT
BNDG CONFORM 3 STRL LF (GAUZE/BANDAGES/DRESSINGS) ×2 IMPLANT
BNDG ELASTIC 4X5.8 VLCR STR LF (GAUZE/BANDAGES/DRESSINGS) ×2 IMPLANT
BNDG ESMARK 4X12 TAN STRL LF (GAUZE/BANDAGES/DRESSINGS) ×1 IMPLANT
BNDG GAUZE ELAST 4 BULKY (GAUZE/BANDAGES/DRESSINGS) ×2 IMPLANT
CUFF TOURN SGL QUICK 18X4 (TOURNIQUET CUFF) IMPLANT
CUFF TOURN SGL QUICK 24 (TOURNIQUET CUFF)
CUFF TRNQT CYL 24X4X16.5-23 (TOURNIQUET CUFF) IMPLANT
DRSG EMULSION OIL 3X8 NADH (GAUZE/BANDAGES/DRESSINGS) ×1 IMPLANT
DURAPREP 26ML APPLICATOR (WOUND CARE) ×2 IMPLANT
ELECT REM PT RETURN 9FT ADLT (ELECTROSURGICAL) ×2
ELECTRODE REM PT RTRN 9FT ADLT (ELECTROSURGICAL) ×1 IMPLANT
GAUZE 4X4 16PLY ~~LOC~~+RFID DBL (SPONGE) ×2 IMPLANT
GAUZE PACKING 1/4 X5 YD (GAUZE/BANDAGES/DRESSINGS) IMPLANT
GAUZE PACKING IODOFORM 1X5 (PACKING) IMPLANT
GAUZE SPONGE 4X4 12PLY STRL (GAUZE/BANDAGES/DRESSINGS) ×2 IMPLANT
GLOVE SRG 8 PF TXTR STRL LF DI (GLOVE) ×1 IMPLANT
GLOVE SURG ENC TEXT LTX SZ8 (GLOVE) ×1 IMPLANT
GLOVE SURG UNDER POLY LF SZ8 (GLOVE) ×2
GOWN STRL REUS W/ TWL XL LVL3 (GOWN DISPOSABLE) ×1 IMPLANT
GOWN STRL REUS W/TWL MED LVL3 (GOWN DISPOSABLE) ×2 IMPLANT
GOWN STRL REUS W/TWL XL LVL3 (GOWN DISPOSABLE) ×2
HANDPIECE VERSAJET DEBRIDEMENT (MISCELLANEOUS) IMPLANT
IV NS 1000ML (IV SOLUTION)
IV NS 1000ML BAXH (IV SOLUTION) ×1 IMPLANT
IV NS IRRIG 3000ML ARTHROMATIC (IV SOLUTION) IMPLANT
KIT TURNOVER KIT A (KITS) ×2 IMPLANT
LABEL OR SOLS (LABEL) ×1 IMPLANT
MANIFOLD NEPTUNE II (INSTRUMENTS) ×1 IMPLANT
NDL FILTER BLUNT 18X1 1/2 (NEEDLE) ×1 IMPLANT
NDL HYPO 25X1 1.5 SAFETY (NEEDLE) ×1 IMPLANT
NEEDLE FILTER BLUNT 18X 1/2SAF (NEEDLE)
NEEDLE FILTER BLUNT 18X1 1/2 (NEEDLE) IMPLANT
NEEDLE HYPO 25X1 1.5 SAFETY (NEEDLE) IMPLANT
NS IRRIG 500ML POUR BTL (IV SOLUTION) ×2 IMPLANT
PACK EXTREMITY ARMC (MISCELLANEOUS) ×2 IMPLANT
PAD ABD DERMACEA PRESS 5X9 (GAUZE/BANDAGES/DRESSINGS) ×2 IMPLANT
PULSAVAC PLUS IRRIG FAN TIP (DISPOSABLE)
SOL PREP PVP 2OZ (MISCELLANEOUS) ×2
SOLUTION PREP PVP 2OZ (MISCELLANEOUS) ×1 IMPLANT
STAPLER SKIN PROX 35W (STAPLE) IMPLANT
STOCKINETTE IMPERVIOUS 9X36 MD (GAUZE/BANDAGES/DRESSINGS) ×2 IMPLANT
SUT ETHILON 2 0 FS 18 (SUTURE) IMPLANT
SUT ETHILON 4-0 (SUTURE)
SUT ETHILON 4-0 FS2 18XMFL BLK (SUTURE)
SUT PROLENE 3 0 PS 2 (SUTURE) IMPLANT
SUT VIC AB 3-0 SH 27 (SUTURE)
SUT VIC AB 3-0 SH 27X BRD (SUTURE) IMPLANT
SUT VIC AB 4-0 FS2 27 (SUTURE) IMPLANT
SUTURE ETHLN 4-0 FS2 18XMF BLK (SUTURE) IMPLANT
SWAB CULTURE AMIES ANAERIB BLU (MISCELLANEOUS) IMPLANT
SYR 10ML LL (SYRINGE) ×4 IMPLANT
TIP FAN IRRIG PULSAVAC PLUS (DISPOSABLE) IMPLANT
WATER STERILE IRR 500ML POUR (IV SOLUTION) ×1 IMPLANT

## 2021-07-23 NOTE — Brief Op Note (Signed)
07/23/2021  5:11 PM  PATIENT:  Maria Espinoza  31 y.o. female  PRE-OPERATIVE DIAGNOSIS:  PAINFUL HARDWARE RIGHT FOOT  POST-OPERATIVE DIAGNOSIS:  PAINFUL HARDWARE RIGHT FOOT  PROCEDURE:  Procedure(s): HARDWARE REMOVAL (Right)  SURGEON:  Surgeon(s) and Role:    Felecia Shelling, DPM - Primary  PHYSICIAN ASSISTANT:   ASSISTANTS: none   ANESTHESIA:   local and IV sedation  EBL:  minimal   BLOOD ADMINISTERED:none  DRAINS: none   LOCAL MEDICATIONS USED:  MARCAINE    and LIDOCAINE   SPECIMEN:  No Specimen  DISPOSITION OF SPECIMEN:  N/A  COUNTS:  YES  TOURNIQUET: no tourniquet utilized  DICTATION: .Office manager  PLAN OF CARE: Discharge to home after PACU  PATIENT DISPOSITION:  PACU - hemodynamically stable.   Delay start of Pharmacological VTE agent (>24hrs) due to surgical blood loss or risk of bleeding: not applicable

## 2021-07-23 NOTE — Transfer of Care (Signed)
Immediate Anesthesia Transfer of Care Note  Patient: Maria Espinoza  Procedure(s) Performed: HARDWARE REMOVAL (Right)  Patient Location: PACU  Anesthesia Type:General  Level of Consciousness: drowsy and patient cooperative  Airway & Oxygen Therapy: Patient Spontanous Breathing and Patient connected to nasal cannula oxygen  Post-op Assessment: Report given to RN and Post -op Vital signs reviewed and stable  Post vital signs: Reviewed and stable  Last Vitals:  Vitals Value Taken Time  BP 95/60 07/23/21 1515  Temp 36.6 C 07/23/21 1515  Pulse 72 07/23/21 1521  Resp 18 07/23/21 1521  SpO2 95 % 07/23/21 1521  Vitals shown include unvalidated device data.  Last Pain:  Vitals:   07/23/21 1515  TempSrc:   PainSc: 0-No pain         Complications: No notable events documented.

## 2021-07-23 NOTE — Interval H&P Note (Signed)
History and Physical Interval Note:  07/23/2021 2:10 PM  Maria Espinoza  has presented today for surgery, with the diagnosis of PAINFUL HARDWARE RIGHT FOOT.  The various methods of treatment have been discussed with the patient and family. After consideration of risks, benefits and other options for treatment, the patient has consented to  Procedure(s): HARDWARE REMOVAL (Right) as a surgical intervention.  The patient's history has been reviewed, patient examined, no change in status, stable for surgery.  I have reviewed the patient's chart and labs.  Questions were answered to the patient's satisfaction.     Felecia Shelling

## 2021-07-23 NOTE — Discharge Instructions (Addendum)
Nonweight bearing right lower extremity with knee scooter. AMBULATORY SURGERY  DISCHARGE INSTRUCTIONS   The drugs that you were given will stay in your system until tomorrow so for the next 24 hours you should not:  Drive an automobile Make any legal decisions Drink any alcoholic beverage   You may resume regular meals tomorrow.  Today it is better to start with liquids and gradually work up to solid foods.  You may eat anything you prefer, but it is better to start with liquids, then soup and crackers, and gradually work up to solid foods.   Please notify your doctor immediately if you have any unusual bleeding, trouble breathing, redness and pain at the surgery site, drainage, fever, or pain not relieved by medication.    Additional Instructions:        Please contact your physician with any problems or Same Day Surgery at [phone], Monday through Friday 6 am to 4 pm, or [place] at [facility] number at [phone].

## 2021-07-23 NOTE — Anesthesia Preprocedure Evaluation (Signed)
Anesthesia Evaluation  Patient identified by MRN, date of birth, ID band Patient awake    Reviewed: Allergy & Precautions, NPO status , Patient's Chart, lab work & pertinent test results  History of Anesthesia Complications (+) DIFFICULT AIRWAY and history of anesthetic complications  Airway Mallampati: III  TM Distance: >3 FB Neck ROM: full    Dental  (+) Chipped   Pulmonary neg shortness of breath, Current Smoker and Patient abstained from smoking.,    Pulmonary exam normal        Cardiovascular (-) angina(-) Past MI negative cardio ROS Normal cardiovascular exam     Neuro/Psych  Headaches, PSYCHIATRIC DISORDERS    GI/Hepatic negative GI ROS, Neg liver ROS, neg GERD  ,  Endo/Other  negative endocrine ROS  Renal/GU negative Renal ROS  negative genitourinary   Musculoskeletal   Abdominal   Peds  Hematology negative hematology ROS (+)   Anesthesia Other Findings Past Medical History: No date: Anemia No date: Anxiety No date: Bipolar depression (HCC) No date: Depression No date: Difficult intubation     Comment:  x 1 with foot surgery No date: Dizziness No date: Migraines No date: Nausea No date: Rectal bleeding  Past Surgical History: 05/14/2021: HARDWARE REMOVAL; Left     Comment:  Procedure: LEFT FOOT HARDWARE REMOVAL;  Surgeon: Felecia Shelling, DPM;  Location: [facility];  Service: Podiatry;                Laterality: Left; 01/15/2021: METATARSAL OSTEOTOMY; Left     Comment:  Procedure: METATARSAL OSTEOTOMY;  Surgeon: Felecia Shelling, DPM;  Location: [facility];  Service: Podiatry;                Laterality: Left; 05/28/2021: METATARSAL OSTEOTOMY; Right     Comment:  Procedure: METATARSAL OSTEOTOMY FOURTH TOE RIGHT FOOT;                Surgeon: Felecia Shelling, DPM;  Location: [facility];                Service: Podiatry;  Laterality: Right; No date: TONSILLECTOMY     Comment:   age 31 No date: WISDOM TOOTH EXTRACTION  BMI    Body Mass Index: 54.36 kg/m      Reproductive/Obstetrics negative OB ROS                             Anesthesia Physical Anesthesia Plan  ASA: 3  Anesthesia Plan: General   Post-op Pain Management:    Induction: Intravenous  PONV Risk Score and Plan: Propofol infusion and TIVA  Airway Management Planned: Natural Airway and Nasal Cannula  Additional Equipment:   Intra-op Plan:   Post-operative Plan:   Informed Consent: I have reviewed the patients History and Physical, chart, labs and discussed the procedure including the risks, benefits and alternatives for the proposed anesthesia with the patient or authorized representative who has indicated his/her understanding and acceptance.     Dental Advisory Given  Plan Discussed with: Anesthesiologist, CRNA and Surgeon  Anesthesia Plan Comments: (Patient consented for risks of anesthesia including but not limited to:  - adverse reactions to medications - risk of airway placement if required - damage to eyes, teeth, lips or other oral mucosa - nerve damage due to positioning  -  sore throat or hoarseness - Damage to heart, brain, nerves, lungs, other parts of body or loss of life  Patient voiced understanding.)        Anesthesia Quick Evaluation

## 2021-07-23 NOTE — Op Note (Signed)
° °  OPERATIVE REPORT Patient name: Maria Espinoza MRN: [id number] DOB: 11-27-1990  DOS: 07/23/21  Preop Dx: mini rail external fixator right foot Postop Dx: same  Procedure:  1.  Removal of external fixator right foot  Surgeon: Felecia Shelling DPM  Anesthesia: 50-50 mixture of 2% lidocaine plain with 0.5% Marcaine plain totaling 20 mL infiltrated in the patient's right lower extremity an ankle block fashion  Hemostasis: None  EBL: Minimal mL Materials: None Injectables: None Pathology: Cultures were taken and sent to pathology for culture and sensitivity  Condition: The patient tolerated the procedure and anesthesia well. No complications noted or reported   Justification for procedure: The patient is a 31 y.o. female who presents today for removal of external fixator right foot. The patient was told benefits as well as possible side effects of the surgery. The patient consented for surgical correction. The patient consent form was reviewed. All patient questions were answered. No guarantees were expressed or implied. The patient and the surgeon both signed the patient consent form with the witness present and placed in the patient's chart.   Procedure in Detail: The patient was brought to the operating room, remaining in the transfer bed in the supine position at which time an aseptic scrub and drape were performed about the patient's respective lower extremity after anesthesia was induced as described above. Attention was then directed to the surgical area where procedure number one commenced.  Procedure #1: Removal of external fixator device right foot Attention was directed to the mini rail external fixator which was loosened and the mini rail was removed from the 4 Schanz pins.  Wire driver was utilized to remove the 4 Schanz pins from the foot.  Light irrigation was utilized there was an open portion of wound which remained open to allow for drainage.  Cultures were taken and sent  to pathology for culture and sensitivity  Dry sterile compressive dressings were then applied to all previously mentioned incision sites about the patient's lower extremity. The patient was then transferred from the operating room to the recovery room having tolerated the procedure and anesthesia well. All vital signs are stable. After a brief stay in the recovery room the patient was discharged with adequate prescriptions for analgesia. Verbal as well as written instructions were provided for the patient regarding wound care. The patient is to keep the dressings clean dry and intact until they are to follow surgeon Dr. Gala Lewandowsky in the office upon discharge.   Felecia Shelling, DPM [facility]  Dr. Felecia Shelling, DPM    [address] [place], [place] [address]                Office [phone]  Fax [phone]

## 2021-07-24 NOTE — Anesthesia Postprocedure Evaluation (Signed)
Anesthesia Post Note  Patient: Maria Espinoza  Procedure(s) Performed: HARDWARE REMOVAL (Right)  Anesthesia Type: General Anesthetic complications: no   There were no known notable events for this encounter.   Last Vitals:  Vitals:   07/23/21 1557 07/23/21 1608  BP:  112/72  Pulse: 61   Resp: 20 18  Temp: (!) 36.1 C 36.7 C  SpO2: 96% 99%    Last Pain:  Vitals:   07/23/21 1608  TempSrc: Temporal  PainSc: 0-No pain                 Jerlyn Ly

## 2021-07-26 ENCOUNTER — Encounter: Payer: Self-pay | Admitting: Podiatry

## 2021-07-30 ENCOUNTER — Other Ambulatory Visit: Payer: Self-pay

## 2021-07-30 ENCOUNTER — Encounter: Payer: Medicaid Other | Admitting: Podiatry

## 2021-07-30 ENCOUNTER — Ambulatory Visit (INDEPENDENT_AMBULATORY_CARE_PROVIDER_SITE_OTHER): Payer: Medicaid Other

## 2021-07-30 ENCOUNTER — Encounter: Payer: Self-pay | Admitting: Podiatry

## 2021-07-30 ENCOUNTER — Ambulatory Visit (INDEPENDENT_AMBULATORY_CARE_PROVIDER_SITE_OTHER): Payer: Medicaid Other | Admitting: Podiatry

## 2021-07-30 VITALS — BP 113/68 | HR 81 | Temp 97.6°F

## 2021-07-30 DIAGNOSIS — Z9889 Other specified postprocedural states: Secondary | ICD-10-CM

## 2021-07-30 DIAGNOSIS — L02611 Cutaneous abscess of right foot: Secondary | ICD-10-CM | POA: Diagnosis not present

## 2021-07-30 DIAGNOSIS — L02619 Cutaneous abscess of unspecified foot: Secondary | ICD-10-CM

## 2021-07-30 DIAGNOSIS — L03119 Cellulitis of unspecified part of limb: Secondary | ICD-10-CM

## 2021-07-30 NOTE — Progress Notes (Signed)
° °  Subjective:  Patient presents today status post removal of mini rail external fixator secondary to brachia metatarsalgia surgery right foot. DOS: 07/23/2021.  Patient states that she is feeling much better.  She has noticed a decrease in the swelling and the pain.  Unfortunately she is somewhat discouraged since the fourth toe did not distract out distally completely and the decision was made to remove the hardware due to not improving cellulitis postoperatively.  She presents for further treatment and evaluation  Past Medical History:  Diagnosis Date   Anemia    Anxiety    Bipolar depression (HCC)    Depression    Difficult intubation    x 1 with foot surgery   Dizziness    Migraines    Nausea    Rectal bleeding       Objective/Physical Exam Neurovascular status intact.  The incision site to the dorsal lateral aspect of the right foot appears significantly improved.  The majority of the incision has healed with exception of the distal portion approximately 1 cm in length which has a granular wound base.  Minimal serosanguineous drainage noted.  There is no malodor.  The erythema around the area has resolved.  There continues to be some moderate edema  Radiographic Exam:  Osteotomy site to the fourth metatarsal appears stable with osseous growth within the distraction site.  Assessment: 1. s/p brachia metatarsalgia surgery with removal of manual external fixator postoperatively. DOS: 07/23/2021   Plan of Care:  1. Patient was evaluated. X-rays reviewed 2.  Continue nonweightbearing in the cam boot. 3.  Overall the patient is doing much better.  Continue antibiotic ointment and a light dressing over the foot 4.  Return to clinic in 1 month for follow-up x-rays.  Once the osteotomy heals completely and everything resolves we may need to consider revisional surgery to further lengthen the toe out to the length of the adjacent digits.  Unfortunately we did have to remove the Ex-Fix due to  nonhealing cellulitis of the foot which appears to be healing nicely.   Felecia Shelling, DPM [facility]  Dr. Felecia Shelling, DPM    [address] [place], [place] [address]                Office [phone]  Fax [phone]

## 2021-08-06 ENCOUNTER — Encounter: Payer: Medicaid Other | Admitting: Podiatry

## 2021-08-20 ENCOUNTER — Encounter: Payer: Medicaid Other | Admitting: Podiatry

## 2021-08-27 ENCOUNTER — Encounter: Payer: Medicaid Other | Admitting: Podiatry

## 2021-08-31 ENCOUNTER — Encounter: Payer: Self-pay | Admitting: Podiatry

## 2021-08-31 ENCOUNTER — Ambulatory Visit (INDEPENDENT_AMBULATORY_CARE_PROVIDER_SITE_OTHER): Payer: Medicaid Other

## 2021-08-31 ENCOUNTER — Ambulatory Visit (INDEPENDENT_AMBULATORY_CARE_PROVIDER_SITE_OTHER): Payer: Medicaid Other | Admitting: Podiatry

## 2021-08-31 ENCOUNTER — Other Ambulatory Visit: Payer: Self-pay

## 2021-08-31 DIAGNOSIS — Q666 Other congenital valgus deformities of feet: Secondary | ICD-10-CM

## 2021-08-31 DIAGNOSIS — Z9889 Other specified postprocedural states: Secondary | ICD-10-CM | POA: Diagnosis not present

## 2021-08-31 DIAGNOSIS — Q6689 Other  specified congenital deformities of feet: Secondary | ICD-10-CM | POA: Diagnosis not present

## 2021-08-31 DIAGNOSIS — Q72899 Other reduction defects of unspecified lower limb: Secondary | ICD-10-CM

## 2021-08-31 NOTE — Progress Notes (Signed)
HPI: 31 y.o. 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  RT foot surgery 05/28/2021. LT foot surgery 01/15/2021.  Patient states that she feels well.  She believes her right foot is still healing.  She begins to get pain after being on her feet for about 10 minutes.  Otherwise she is doing very well and is improving  Past Medical History:  Diagnosis Date   Anemia    Anxiety    Bipolar depression (HCC)    Depression    Difficult intubation    x 1 with foot surgery   Dizziness    Migraines    Nausea    Rectal bleeding     Past Surgical History:  Procedure Laterality Date   HARDWARE REMOVAL Left 05/14/2021   Procedure: LEFT FOOT HARDWARE REMOVAL;  Surgeon: Felecia Shelling, DPM;  Location: [facility];  Service: Podiatry;  Laterality: Left;   HARDWARE REMOVAL Right 07/23/2021   Procedure: HARDWARE REMOVAL;  Surgeon: Felecia Shelling, DPM;  Location: [facility];  Service: Podiatry;  Laterality: Right;   METATARSAL OSTEOTOMY Left 01/15/2021   Procedure: METATARSAL OSTEOTOMY;  Surgeon: Felecia Shelling, DPM;  Location: [facility];  Service: Podiatry;  Laterality: Left;   METATARSAL OSTEOTOMY Right 05/28/2021   Procedure: METATARSAL OSTEOTOMY FOURTH TOE RIGHT FOOT;  Surgeon: Felecia Shelling, DPM;  Location: [facility];  Service: Podiatry;  Laterality: Right;   TONSILLECTOMY     age 31   WISDOM TOOTH EXTRACTION      Allergies  Allergen Reactions   Latex Rash     Physical Exam: General: The patient is alert and oriented x3 in no acute distress.  Dermatology: Skin is warm, dry and supple bilateral lower extremities. Negative for open lesions or macerations.  Skin incisions healed.  Vascular: Palpable pedal pulses bilaterally. Capillary refill within normal limits.  Negative for any significant edema or erythema.  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  Radiographic Exam:  Degenerative changes noted along the fourth ray of the right foot.  Advanced erosion noted to the fourth MTP joint with dorsal displacement of the toe.  Osseous callus formation noted to the osteotomy site with distraction.  There is some fragmentation within the traction site  Assessment: 1. S/p brachymetatarsia surgery LT 01/15/2021 2. S/p brachymetatarsia surgery RT 05/28/2021   Plan of Care:  1. Patient evaluated. X-Rays reviewed.  2.  Stressed the importance of reducing the patient's activity.  Recommend cam boot when she is on her foot. 3.  Explained to the patient that the osteotomy and callus distraction is still healing.  She understands that the external fixator was removed prematurely given the presence of infection at that time.  Overall the patient is doing very well and is happy.  I explained to the patient that we will allow the foot to heal completely for about 1 year and then readdress possible revisional surgery.  The patient agrees and is okay with this plan 4.  Tentatively plan for the patient to return to work first week of April. 5.  Return to clinic last week of March for follow-up x-ray bilateral feet      Felecia Shelling, DPM [facility]  Dr. Felecia Shelling, DPM    [address] [place], [place] [address]                Office [phone]  Fax [phone]

## 2021-09-01 ENCOUNTER — Encounter: Payer: Self-pay | Admitting: Podiatry

## 2021-09-09 DIAGNOSIS — M79676 Pain in unspecified toe(s): Secondary | ICD-10-CM

## 2021-10-12 ENCOUNTER — Other Ambulatory Visit: Payer: Medicaid Other

## 2021-10-12 ENCOUNTER — Encounter: Payer: Medicaid Other | Admitting: Podiatry

## 2021-10-12 ENCOUNTER — Ambulatory Visit: Payer: Medicaid Other

## 2021-10-14 ENCOUNTER — Encounter: Payer: Self-pay | Admitting: Family Medicine

## 2021-10-14 ENCOUNTER — Ambulatory Visit: Payer: Medicaid Other | Admitting: Family Medicine

## 2021-10-14 DIAGNOSIS — Z113 Encounter for screening for infections with a predominantly sexual mode of transmission: Secondary | ICD-10-CM

## 2021-10-14 LAB — HM HIV SCREENING LAB: HM HIV Screening: NEGATIVE

## 2021-10-14 LAB — WET PREP FOR TRICH, YEAST, CLUE
Trichomonas Exam: NEGATIVE
Yeast Exam: NEGATIVE

## 2021-10-14 NOTE — Patient Instructions (Signed)
Steps to prevent BV and yeast: ?Wear all-cotton underwear ?Sleep without underwear ?Take showers instead of baths ?Keep the area dry  ?Use a hair dryer on low after bathing to dry the area ?Avoid scented soaps and body washes ? ?Wear loose fitting clothing, especially during warm/hot weather ?Do not douche ?May try over the counter probiotics or boric acid gel or suppositories ?Stop smoking  ?

## 2021-10-14 NOTE — Progress Notes (Signed)
[facility] Department ? ?STI clinic/screening visit ?[address] ?[place] [address] ?[phone] ? ?Subjective:  ?Maria Espinoza is a 31 y.o. female being seen today for an STI screening visit. The patient reports they do have symptoms.  Patient reports that they do not desire a pregnancy in the next year.   They reported they are not interested in discussing contraception today.   ? ?Patient's last menstrual period was 09/19/2021 (exact date). ? ? ?Patient has the following medical conditions:   ?Patient Active Problem List  ? Diagnosis Date Noted  ? Establishing care with new doctor, encounter for 01/14/2021  ? Social anxiety disorder 10/03/2018  ? Severe recurrent major depression without psychotic features (HCC) 10/03/2018  ? Intentional doxepin overdose (HCC) 10/01/2018  ? Bipolar disorder, unspecified (HCC) 07/24/2018  ? Depression with anxiety 04/25/2018  ? Migraines 04/25/2018  ? Morbid obesity with BMI of 50.0-59.9, adult (HCC) 04/25/2018  ? ? ?Chief Complaint  ?Patient presents with  ? SEXUALLY TRANSMITTED DISEASE  ?  Screening ?  ? ? ?HPI ? ?Patient reports here for screening  ? ?Last HIV test per patient/review of record was 11/24/2020 ?Patient reports last pap was 11/24/2020.  ? ?Screening for MPX risk: ?Does the patient have an unexplained rash? No ?Is the patient MSM? No ?Does the patient endorse multiple sex partners or anonymous sex partners? No ?Did the patient have close or sexual contact with a person diagnosed with MPX? No ?Has the patient traveled outside the [place] where MPX is endemic? No ?Is there a high clinical suspicion for MPX-- evidenced by one of the following No ? -Unlikely to be chickenpox ? -Lymphadenopathy ? -Rash that present in same phase of evolution on any given body part ?See flowsheet for further details and programmatic requirements.  ? ? ?The following portions of the patient's history were reviewed and updated as appropriate: allergies, current  medications, past medical history, past social history, past surgical history and problem list. ? ?Objective:  ?There were no vitals filed for this visit. ? ?Physical Exam ?Vitals and nursing note reviewed.  ?Constitutional:   ?   Appearance: Normal appearance. She is obese.  ?HENT:  ?   Head: Normocephalic and atraumatic.  ?   Mouth/Throat:  ?   Mouth: Mucous membranes are moist.  ?   Pharynx: Oropharynx is clear. No oropharyngeal exudate or posterior oropharyngeal erythema.  ?Pulmonary:  ?   Effort: Pulmonary effort is normal.  ?Abdominal:  ?   General: Abdomen is flat.  ?   Palpations: There is no mass.  ?   Tenderness: There is no abdominal tenderness. There is no rebound.  ?Genitourinary: ?   General: Normal vulva.  ?   Exam position: Lithotomy position.  ?   Pubic Area: No rash or pubic lice.   ?   Labia:     ?   Right: No rash or lesion.     ?   Left: No rash or lesion.   ?   Vagina: Normal. No vaginal discharge, erythema, bleeding or lesions.  ?   Cervix: No cervical motion tenderness, discharge, friability, lesion or erythema.  ?   Uterus: Normal.   ?   Adnexa: Right adnexa normal and left adnexa normal.  ?   Rectum: Normal.  ?   Comments: External genitalia without, lice, nits, erythema, edema , lesions or inguinal adenopathy. Vagina with normal mucosa and discharge and pH equals 4.  Cervix without visual lesions, uterus firm, mobile,  non-tender, no masses, CMT adnexal fullness or tenderness.   ?Lymphadenopathy:  ?   Head:  ?   Right side of head: No preauricular or posterior auricular adenopathy.  ?   Left side of head: No preauricular or posterior auricular adenopathy.  ?   Cervical: No cervical adenopathy.  ?   Upper Body:  ?   Right upper body: No supraclavicular or axillary adenopathy.  ?   Left upper body: No supraclavicular or axillary adenopathy.  ?   Lower Body: No right inguinal adenopathy. No left inguinal adenopathy.  ?Skin: ?   General: Skin is warm and dry.  ?   Findings: No rash.   ?Neurological:  ?   Mental Status: She is alert and oriented to person, place, and time.  ? ? ? ?Assessment and Plan:  ?Maria Espinoza is a 31 y.o. female presenting to the [facility] Department for STI screening ? ?1. Screening examination for venereal disease ?Patient accepted all screenings including wet prep, oral, vaginal CT/GC and bloodwork for HIV/RPR.  ?Patient meets criteria for HepB screening? No. Ordered? No -   ?Patient meets criteria for HepC screening? No. Ordered? No -   ? ?Wet prep results neg    ?Treatment needed  ?Discussed time line for [facility] results and that patient will be called with positive results and encouraged patient to call if she had not heard in 2 weeks.  ?Counseled to return or seek care for continued or worsening symptoms ?Recommended condom use with all sex ? ?Patient is currently using  no BCM   to prevent pregnancy.   ?- Chlamydia/Gonorrhea [place] [facility] ?- HIV [place] [facility] ?- Syphilis Serology, [place] [facility] ?- WET PREP FOR TRICH, YEAST, CLUE ? ? ? ? ?Return for as needed. ? ?Future Appointments  ?Date Time Provider Department Center  ?10/19/2021  9:45 AM Felecia Shelling, DPM [id number]  ? ? ?Wendi Snipes, FNP ?

## 2021-10-14 NOTE — Progress Notes (Signed)
Pt here for STD screening.  Wet mount results reviewed, no treatment required per Provider.  Pt declined condoms. Marian Meneely M Kyndal Gloster, RN  

## 2021-10-19 ENCOUNTER — Encounter: Payer: Self-pay | Admitting: Podiatry

## 2021-10-19 ENCOUNTER — Ambulatory Visit (INDEPENDENT_AMBULATORY_CARE_PROVIDER_SITE_OTHER): Payer: Medicaid Other

## 2021-10-19 ENCOUNTER — Encounter: Payer: Self-pay | Admitting: *Deleted

## 2021-10-19 ENCOUNTER — Ambulatory Visit (INDEPENDENT_AMBULATORY_CARE_PROVIDER_SITE_OTHER): Payer: Medicaid Other | Admitting: Podiatry

## 2021-10-19 DIAGNOSIS — Q6689 Other  specified congenital deformities of feet: Secondary | ICD-10-CM | POA: Diagnosis not present

## 2021-10-19 DIAGNOSIS — Z9889 Other specified postprocedural states: Secondary | ICD-10-CM | POA: Diagnosis not present

## 2021-10-19 NOTE — Progress Notes (Signed)
? ?HPI: 31 y.o. 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  RT foot surgery 05/28/2021. LT foot surgery 01/15/2021.  Patient doing well.  She continues to have some pain and tenderness especially to the right foot.  No new complaints at this time ? ?Past Medical History:  ?Diagnosis Date  ? Anemia   ? Anxiety   ? Bipolar depression ([place])   ? Depression   ? Difficult intubation   ? x 1 with foot surgery  ? Dizziness   ? Migraines   ? Nausea   ? Rectal bleeding   ? ? ?Past Surgical History:  ?Procedure Laterality Date  ? HARDWARE REMOVAL Left 05/14/2021  ? Procedure: LEFT FOOT HARDWARE REMOVAL;  Surgeon: Edrick Kins, DPM;  Location: [facility];  Service: Podiatry;  Laterality: Left;  ? HARDWARE REMOVAL Right 07/23/2021  ? Procedure: HARDWARE REMOVAL;  Surgeon: Edrick Kins, DPM;  Location: [facility];  Service: Podiatry;  Laterality: Right;  ? METATARSAL OSTEOTOMY Left 01/15/2021  ? Procedure: METATARSAL OSTEOTOMY;  Surgeon: Edrick Kins, DPM;  Location: [facility];  Service: Podiatry;  Laterality: Left;  ? METATARSAL OSTEOTOMY Right 05/28/2021  ? Procedure: METATARSAL OSTEOTOMY FOURTH TOE RIGHT FOOT;  Surgeon: Edrick Kins, DPM;  Location: [facility];  Service: Podiatry;  Laterality: Right;  ? TONSILLECTOMY    ? age 31  ? WISDOM TOOTH EXTRACTION    ? ? ?Allergies  ?Allergen Reactions  ? Latex Rash  ? ?  ?Physical Exam: ?General: The patient is alert and oriented x3 in no acute distress. ? ?Dermatology: Skin is warm, dry and supple bilateral lower extremities. Negative for open lesions or macerations.  Skin incisions healed.  Hypertrophic scar noted especially to the right foot ? ?Vascular: Palpable pedal pulses bilaterally. Capillary refill within normal limits.  Negative for any significant edema or erythema.  Prior infection to the foot is completely resolved ? ?Neurological: Light touch and protective  threshold grossly intact ? 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 ? ?Radiographic Exam:  ?Degenerative changes noted along the fourth ray of the bilateral foot.  Advanced erosion noted to the fourth MTP joint with dorsal displacement of the toe right.  Osseous callus formation noted to the osteotomy site with distraction.  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 ? ?Assessment: ?1. S/p brachymetatarsia surgery LT 01/15/2021 ?2. S/p brachymetatarsia surgery RT 05/28/2021 ? ? ?Plan of Care:  ?1. Patient evaluated. X-Rays reviewed.  ?2.  Explained to the patient that the osteotomy sites of the fourth metatarsals are still healing.  Unfortunately the patient had postoperative complications including infection and injuries to the feet which created less than ideal surgical outcomes. ?3.  I did consider bone stimulator however patient's insurance will not cover. ?4.  Continue conservative treatment including good supportive shoes and sneakers ?5.  Continue to refrain from work an additional 6 weeks.  Patient is actually applying for permanent disability due to mental health issues. ?6.  Return to clinic in 6 weeks for follow-up x-ray.  Planning for revisional surgery late summer/fall 2023 ?  ?  ?Edrick Kins, DPM ?[place] ? ?Dr. Edrick Kins, DPM  ?  ?[address]                                        ?  [place], [place] [address]                ?Office [phone]  ?Fax [phone] ? ? ? ? ?

## 2021-11-09 ENCOUNTER — Ambulatory Visit (LOCAL_COMMUNITY_HEALTH_CENTER): Payer: Self-pay

## 2021-11-09 DIAGNOSIS — Z111 Encounter for screening for respiratory tuberculosis: Secondary | ICD-10-CM

## 2021-11-12 ENCOUNTER — Ambulatory Visit (LOCAL_COMMUNITY_HEALTH_CENTER): Payer: Self-pay

## 2021-11-12 DIAGNOSIS — Z111 Encounter for screening for respiratory tuberculosis: Secondary | ICD-10-CM

## 2021-11-12 LAB — TB SKIN TEST
Induration: 0 mm
TB Skin Test: NEGATIVE

## 2021-11-24 ENCOUNTER — Other Ambulatory Visit: Payer: Self-pay | Admitting: Podiatry

## 2021-11-24 ENCOUNTER — Telehealth: Payer: Self-pay | Admitting: Podiatry

## 2021-11-24 MED ORDER — TERBINAFINE HCL 250 MG PO TABS: 250.0000 mg | ORAL_TABLET | Freq: Every day | ORAL | 0 refills | Status: DC

## 2021-11-24 NOTE — Progress Notes (Signed)
PRN tinea pedis 

## 2021-11-24 NOTE — Telephone Encounter (Signed)
Pt is having a flare up of foot fungus and would like a refill on terbinafine (LAMISIL) 250 MG tablet  ? ?[id number] -  (N), [place] - [address] ? ?Please advise. ?

## 2021-11-25 NOTE — Telephone Encounter (Signed)
Pt is aware that medication was sent to pharmacy.  ?

## 2021-11-30 ENCOUNTER — Ambulatory Visit (INDEPENDENT_AMBULATORY_CARE_PROVIDER_SITE_OTHER): Payer: Medicaid Other | Admitting: Podiatry

## 2021-11-30 ENCOUNTER — Ambulatory Visit (INDEPENDENT_AMBULATORY_CARE_PROVIDER_SITE_OTHER): Payer: Medicaid Other

## 2021-11-30 DIAGNOSIS — M216X2 Other acquired deformities of left foot: Secondary | ICD-10-CM

## 2021-11-30 DIAGNOSIS — Z9889 Other specified postprocedural states: Secondary | ICD-10-CM

## 2021-11-30 DIAGNOSIS — Q6689 Other  specified congenital deformities of feet: Secondary | ICD-10-CM | POA: Diagnosis not present

## 2021-11-30 DIAGNOSIS — M9689 Other intraoperative and postprocedural complications and disorders of the musculoskeletal system: Secondary | ICD-10-CM

## 2021-11-30 MED ORDER — TRAMADOL HCL 50 MG PO TABS: 50.0000 mg | ORAL_TABLET | ORAL | 0 refills | Status: AC | PRN

## 2021-11-30 NOTE — Progress Notes (Signed)
? ?HPI: 31 y.o. 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  RT foot surgery 05/28/2021. LT foot surgery 01/15/2021.  Patient doing well.  She continues to have some pain and tenderness especially to the right foot.  No new complaints at this time ? ?Past Medical History:  ?Diagnosis Date  ? Anemia   ? Anxiety   ? Bipolar depression (HCC)   ? Depression   ? Difficult intubation   ? x 1 with foot surgery  ? Dizziness   ? Migraines   ? Nausea   ? Rectal bleeding   ? ? ?Past Surgical History:  ?Procedure Laterality Date  ? HARDWARE REMOVAL Left 05/14/2021  ? Procedure: LEFT FOOT HARDWARE REMOVAL;  Surgeon: Felecia Shelling, DPM;  Location: [facility];  Service: Podiatry;  Laterality: Left;  ? HARDWARE REMOVAL Right 07/23/2021  ? Procedure: HARDWARE REMOVAL;  Surgeon: Felecia Shelling, DPM;  Location: [facility];  Service: Podiatry;  Laterality: Right;  ? METATARSAL OSTEOTOMY Left 01/15/2021  ? Procedure: METATARSAL OSTEOTOMY;  Surgeon: Felecia Shelling, DPM;  Location: [facility];  Service: Podiatry;  Laterality: Left;  ? METATARSAL OSTEOTOMY Right 05/28/2021  ? Procedure: METATARSAL OSTEOTOMY FOURTH TOE RIGHT FOOT;  Surgeon: Felecia Shelling, DPM;  Location: [facility];  Service: Podiatry;  Laterality: Right;  ? TONSILLECTOMY    ? age 81  ? WISDOM TOOTH EXTRACTION    ? ? ?Allergies  ?Allergen Reactions  ? Latex Rash  ? ?  ?Physical Exam: ?General: The patient is alert and oriented x3 in no acute distress. ? ?Dermatology: Skin is warm, dry and supple bilateral lower extremities. Negative for open lesions or macerations.  Skin incisions healed.  Hypertrophic scar noted especially to the right foot ? ?Vascular: Palpable pedal pulses bilaterally. Capillary refill within normal limits.  Negative for any significant edema or erythema.  Prior infection to the foot is completely resolved ? ?Neurological: Light touch and protective  threshold grossly intact ? 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 ? ?Radiographic Exam:  ?Degenerative changes noted along the fourth ray of the bilateral foot.  Advanced erosion noted to the fourth MTP joint with dorsal displacement of the toe right.  Osseous callus formation noted to the osteotomy site with distraction.  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 ? ?Assessment: ?1. S/p brachymetatarsia surgery LT 01/15/2021 ?2. S/p brachymetatarsia surgery RT 05/28/2021 ?3.  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 ? ?Plan of Care:  ?1. Patient evaluated. X-Rays reviewed.  Today I had a very detailed conversation with the patient regarding the areas of bone that have not healed and explained to the patient that as long as the bones are still healing or have not fused she will continue to have some mild to moderate pain and swelling/edema to the feet.  Patient has been working on her feet 9-hour shifts. ?2.  Explained to the patient that the osteotomy sites of the fourth metatarsals are still healing.  The osteotomy sites demonstrate nonunion 6 months postoperatively.  At this time we are going to try to approve the patient for external bone stimulator pending insurance authorization and preapproval ?3.  Counseled patient on reducing her smoking.  Explained the direct correlation between bone healing and smoking which is detrimental to bone healing.  Patient understands ?4.  Prescription for tramadol 50 mg every 4  hours as needed pain  ?5.  Return to clinic 8 weeks for follow-up x-ray ?  ?  ?Felecia Shelling, DPM ?[facility] ? ?Dr. Felecia Shelling, DPM  ?  ?[address]                                        ?[place], [place] [address]                ?Office [phone]  ?Fax [phone] ? ? ? ? ?

## 2022-01-21 ENCOUNTER — Ambulatory Visit (INDEPENDENT_AMBULATORY_CARE_PROVIDER_SITE_OTHER): Payer: Medicaid Other | Admitting: Podiatry

## 2022-01-21 ENCOUNTER — Other Ambulatory Visit: Payer: Self-pay | Admitting: Podiatry

## 2022-01-21 ENCOUNTER — Encounter: Payer: Self-pay | Admitting: Podiatry

## 2022-01-21 ENCOUNTER — Ambulatory Visit (INDEPENDENT_AMBULATORY_CARE_PROVIDER_SITE_OTHER): Payer: Medicaid Other

## 2022-01-21 ENCOUNTER — Ambulatory Visit: Payer: Medicaid Other

## 2022-01-21 DIAGNOSIS — M216X2 Other acquired deformities of left foot: Secondary | ICD-10-CM

## 2022-01-21 DIAGNOSIS — Z9889 Other specified postprocedural states: Secondary | ICD-10-CM

## 2022-01-21 MED ORDER — CLOTRIMAZOLE-BETAMETHASONE 1-0.05 % EX CREA: 1.0000 | TOPICAL_CREAM | Freq: Two times a day (BID) | CUTANEOUS | 1 refills | Status: DC

## 2022-01-21 MED ORDER — TRAMADOL HCL 50 MG PO TABS: 50.0000 mg | ORAL_TABLET | Freq: Four times a day (QID) | ORAL | 0 refills | Status: AC | PRN

## 2022-01-21 NOTE — Progress Notes (Signed)
HPI: 31 y.o. 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  RT foot surgery 05/28/2021. LT foot surgery 01/15/2021.  Patient is doing well.  She has been using the bone stimulator now for about 26 days.  She continues to have some pain and tenderness with swelling especially by the end of the day.  She also complains of thickened skin to the plantar heels bilateral with fissuring.  She experiences burning sensation as well.  She says that the oral antifungal pills have helped significantly with her nails but she continues to have symptomatic painful skin to the heels  Past Medical History:  Diagnosis Date   Anemia    Anxiety    Bipolar depression (HCC)    Depression    Difficult intubation    x 1 with foot surgery   Dizziness    Migraines    Nausea    Rectal bleeding     Past Surgical History:  Procedure Laterality Date   HARDWARE REMOVAL Left 05/14/2021   Procedure: LEFT FOOT HARDWARE REMOVAL;  Surgeon: Felecia Shelling, DPM;  Location: [facility];  Service: Podiatry;  Laterality: Left;   HARDWARE REMOVAL Right 07/23/2021   Procedure: HARDWARE REMOVAL;  Surgeon: Felecia Shelling, DPM;  Location: [facility];  Service: Podiatry;  Laterality: Right;   METATARSAL OSTEOTOMY Left 01/15/2021   Procedure: METATARSAL OSTEOTOMY;  Surgeon: Felecia Shelling, DPM;  Location: [facility];  Service: Podiatry;  Laterality: Left;   METATARSAL OSTEOTOMY Right 05/28/2021   Procedure: METATARSAL OSTEOTOMY FOURTH TOE RIGHT FOOT;  Surgeon: Felecia Shelling, DPM;  Location: [facility];  Service: Podiatry;  Laterality: Right;   TONSILLECTOMY     age 31   WISDOM TOOTH EXTRACTION      Allergies  Allergen Reactions   Latex Rash     Physical Exam: General: The patient is alert and oriented x3 in no acute distress.  Dermatology: Skin is warm, dry and supple bilateral lower extremities. Negative for open lesions or  macerations.  Skin incisions healed.  Hypertrophic scar noted especially to the right foot.  There is some hyperkeratotic skin with superficial fissuring noted to the plantar heels bilateral  Vascular: Palpable pedal pulses bilaterally. Capillary refill within normal limits.  Negative for any significant edema or erythema.  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  Radiographic Exam B/L feet:  Right: Degenerative changes noted along the fourth ray of the bilateral foot.  Advanced degenerative changes noted to the fourth MTP joint with dorsal displacement of the toe right.  Osseous callus formation noted to the osteotomy site with distraction appears to be healing and improving with osseous reorganization and restructuring.  There continues to be nonunion however.  Clinically and radiographically there is no concern for osteomyelitis.  No acute fractures identified Left: There continues to be nonunion.  Demonstration of bone growth noted around the osteotomy site.  Joint spaces preserved.  No acute fractures identified  Assessment: 1. S/p brachymetatarsia surgery LT 01/15/2021 2. S/p brachymetatarsia surgery RT 05/28/2021 3.  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  Tinea pedis bilateral heels with superficial fissuring of skin   Plan of Care:  1. Patient evaluated. X-Rays reviewed.  2.  Continue the Biomet external bone stimulators daily 3.  Orders placed for BMP and vitamin D levels. 4.  Prescription for Lotrisone cream applied daily. Foot Miracle lotion also dispensed at checkout to apply daily to the heels 5.  Refill prescription for tramadol 50 mg every 6 hours as needed mild to moderate pain 6.  Return to clinic 6 weeks for follow-up x-ray.  At this time we may  consider revisional surgery especially to the right foot breaking metatarsalgia site.   Felecia Shelling, DPM [facility]  Dr. Felecia Shelling, DPM    [address] [place], [place] [address]                Office [phone]  Fax [phone]

## 2022-01-22 ENCOUNTER — Other Ambulatory Visit: Payer: Self-pay | Admitting: Podiatry

## 2022-01-22 LAB — BASIC METABOLIC PANEL
BUN/Creatinine Ratio: 16 (ref 9–23)
BUN: 13 mg/dL (ref 6–20)
CO2: 20 mmol/L (ref 20–29)
Calcium: 9.3 mg/dL (ref 8.7–10.2)
Chloride: 105 mmol/L (ref 96–106)
Creatinine, Ser: 0.8 mg/dL (ref 0.57–1.00)
Glucose: 100 mg/dL — ABNORMAL HIGH (ref 70–99)
Potassium: 4.3 mmol/L (ref 3.5–5.2)
Sodium: 139 mmol/L (ref 134–144)
eGFR: 101 mL/min/{1.73_m2} (ref >59)

## 2022-01-22 LAB — VITAMIN D 25 HYDROXY (VIT D DEFICIENCY, FRACTURES): Vit D, 25-Hydroxy: 12.7 ng/mL — ABNORMAL LOW (ref 30.0–100.0)

## 2022-01-22 MED ORDER — VITAMIN D (ERGOCALCIFEROL) 1.25 MG (50000 UNIT) PO CAPS: 50000.0000 [IU] | ORAL_CAPSULE | ORAL | 1 refills | Status: DC

## 2022-01-22 NOTE — Progress Notes (Signed)
Rx vitamin D, ergocalciferol (Drisdol) 1.25mg  (50,000 units) every 7 days based on Vit D levels taken 01/22/2022.  -Dr. Logan Bores  Felecia Shelling, DPM [facility]  Dr. Felecia Shelling, DPM    [address] [place], [place] [address]                Office [phone]  Fax [phone]

## 2022-03-04 ENCOUNTER — Ambulatory Visit: Payer: Medicaid Other | Admitting: Podiatry

## 2022-04-25 ENCOUNTER — Ambulatory Visit (INDEPENDENT_AMBULATORY_CARE_PROVIDER_SITE_OTHER): Payer: Medicaid Other | Admitting: *Deleted

## 2022-04-25 ENCOUNTER — Encounter: Payer: Self-pay | Admitting: Internal Medicine

## 2022-04-25 DIAGNOSIS — R8290 Unspecified abnormal findings in urine: Secondary | ICD-10-CM | POA: Diagnosis not present

## 2022-04-25 DIAGNOSIS — N390 Urinary tract infection, site not specified: Secondary | ICD-10-CM

## 2022-04-25 LAB — POCT URINALYSIS DIPSTICK
Bilirubin, UA: NEGATIVE
Blood, UA: NEGATIVE
Glucose, UA: NEGATIVE
Ketones, UA: NEGATIVE
Leukocytes, UA: NEGATIVE
Nitrite, UA: NEGATIVE
Protein, UA: NEGATIVE
Spec Grav, UA: 1.02 (ref 1.010–1.025)
Urobilinogen, UA: 0.2 E.U./dL
pH, UA: 6 (ref 5.0–8.0)

## 2022-04-25 NOTE — Progress Notes (Signed)
Patient having lower back pain especially when urinating. Has been dark, strong smelling urine. Patient states she has been drinking a gallon of water per day, but urine still extremely dark. Patient's urine is neg for blood or leukocytes. We will send to Urology for second opinion and to rule out kidney stone.

## 2022-04-26 ENCOUNTER — Ambulatory Visit (INDEPENDENT_AMBULATORY_CARE_PROVIDER_SITE_OTHER): Payer: Medicaid Other | Admitting: Podiatry

## 2022-04-26 ENCOUNTER — Other Ambulatory Visit: Payer: Self-pay | Admitting: *Deleted

## 2022-04-26 ENCOUNTER — Other Ambulatory Visit
Admission: RE | Admit: 2022-04-26 | Discharge: 2022-04-26 | Disposition: A | Discharge status: Home / Self Care | Payer: Medicaid Other | Attending: Urology | Admitting: Urology

## 2022-04-26 ENCOUNTER — Other Ambulatory Visit: Payer: Self-pay | Admitting: Podiatry

## 2022-04-26 ENCOUNTER — Ambulatory Visit (INDEPENDENT_AMBULATORY_CARE_PROVIDER_SITE_OTHER): Payer: Medicaid Other | Admitting: Urology

## 2022-04-26 ENCOUNTER — Encounter: Payer: Self-pay | Admitting: Urology

## 2022-04-26 ENCOUNTER — Ambulatory Visit (INDEPENDENT_AMBULATORY_CARE_PROVIDER_SITE_OTHER): Payer: Medicaid Other

## 2022-04-26 VITALS — BP 139/82 | HR 72 | Ht 63.0 in | Wt 296.0 lb

## 2022-04-26 DIAGNOSIS — R8290 Unspecified abnormal findings in urine: Secondary | ICD-10-CM

## 2022-04-26 DIAGNOSIS — M216X1 Other acquired deformities of right foot: Secondary | ICD-10-CM | POA: Diagnosis not present

## 2022-04-26 DIAGNOSIS — R3915 Urgency of urination: Secondary | ICD-10-CM

## 2022-04-26 DIAGNOSIS — Q72891 Other reduction defects of right lower limb: Secondary | ICD-10-CM | POA: Diagnosis not present

## 2022-04-26 DIAGNOSIS — Z113 Encounter for screening for infections with a predominantly sexual mode of transmission: Secondary | ICD-10-CM

## 2022-04-26 DIAGNOSIS — R350 Frequency of micturition: Secondary | ICD-10-CM | POA: Diagnosis not present

## 2022-04-26 DIAGNOSIS — R399 Unspecified symptoms and signs involving the genitourinary system: Secondary | ICD-10-CM

## 2022-04-26 DIAGNOSIS — N200 Calculus of kidney: Secondary | ICD-10-CM

## 2022-04-26 LAB — URINALYSIS, COMPLETE (UACMP) WITH MICROSCOPIC
Bilirubin Urine: NEGATIVE
Glucose, UA: NEGATIVE mg/dL
Hgb urine dipstick: NEGATIVE
Leukocytes,Ua: NEGATIVE
Nitrite: NEGATIVE
Protein, ur: NEGATIVE mg/dL
Specific Gravity, Urine: 1.02 (ref 1.005–1.030)
pH: 7.5 (ref 5.0–8.0)

## 2022-04-26 LAB — CHLAMYDIA/NGC RT PCR (ARMC ONLY)
Chlamydia Tr: NOT DETECTED
N gonorrhoeae: NOT DETECTED

## 2022-04-26 MED ORDER — DOXYCYCLINE HYCLATE 100 MG PO CAPS: 100.0000 mg | ORAL_CAPSULE | Freq: Two times a day (BID) | ORAL | 0 refills | Status: DC

## 2022-04-26 NOTE — Progress Notes (Signed)
04/26/22 3:24 PM   Maria Espinoza [birth year] [id number]  CC: Dark urine, foul-smelling urine, low back pain, urinary urgency/frequency  HPI: 31 year old female with morbid obesity and BMI 52 who reports a few months of the above complaints.  Primary issues have been dark-colored and foul-smelling urine with some increased urinary urgency and frequency, occasional urge incontinence, as well as low midline bilateral low back pain.  Dipstick UA with PCP was benign yesterday.  Most recent STD testing was in March 2023.  She reports new diet of intermittent fasting over the last few weeks, and drinks a gallon of water, 1 cup of coffee, no other fluids.  She has had about 25 pounds of weight loss in the last few weeks.  She denies any gross hematuria.  She reports her symptoms feel similar to prior UTIs.  Urinalysis today with 6-10 squamous cells, negative leukocytes, nitrite negative, 6-10 RBC, few bacteria.   PMH: Past Medical History:  Diagnosis Date   Anemia    Anxiety    Bipolar depression ([place])    Depression    Difficult intubation    x 1 with foot surgery   Dizziness    Migraines    Nausea    Rectal bleeding     Surgical History: Past Surgical History:  Procedure Laterality Date   HARDWARE REMOVAL Left 05/14/2021   Procedure: LEFT FOOT HARDWARE REMOVAL;  Surgeon: Edrick Kins, DPM;  Location: [facility];  Service: Podiatry;  Laterality: Left;   HARDWARE REMOVAL Right 07/23/2021   Procedure: HARDWARE REMOVAL;  Surgeon: Edrick Kins, DPM;  Location: [facility];  Service: Podiatry;  Laterality: Right;   METATARSAL OSTEOTOMY Left 01/15/2021   Procedure: METATARSAL OSTEOTOMY;  Surgeon: Edrick Kins, DPM;  Location: [facility];  Service: Podiatry;  Laterality: Left;   METATARSAL OSTEOTOMY Right 05/28/2021   Procedure: METATARSAL OSTEOTOMY FOURTH TOE RIGHT FOOT;  Surgeon: Edrick Kins, DPM;  Location: [facility];  Service: Podiatry;  Laterality: Right;   TONSILLECTOMY     age 77    WISDOM TOOTH EXTRACTION       Family History: Family History  Problem Relation Age of Onset   Migraines Father    Heart disease Maternal Grandmother    Depression Maternal Grandmother    Hypertension Maternal Grandmother    Breast cancer Paternal Grandfather     Social History:  reports that she has been smoking cigarettes. She has a 1.50 pack-year smoking history. She has been exposed to tobacco smoke. She has never used smokeless tobacco. She reports current alcohol use. She reports that she does not use drugs.  Physical Exam: BP 139/82   Pulse 72   Ht 5\' 3"  (1.6 m)   Wt 296 lb (134.3 kg)   BMI 52.43 kg/m    Constitutional:  Alert and oriented, No acute distress. Cardiovascular: No clubbing, cyanosis, or edema. Respiratory: Normal respiratory effort, no increased work of breathing. GI: Abdomen is soft, nontender, nondistended, no abdominal masses  Assessment & Plan:   31 year old female with foul-smelling urine, dark-colored urine, midline low back pain, urinary urgency/frequency of unclear etiology.  Urinalysis today contaminated but relatively benign aside from 6-10 RBCs.  We reviewed possible etiologies including UTI, STD, urethritis, nephrolithiasis, overactive bladder.  Urine today sent for culture, atypicals, and STD testing.  I also recommended a KUB today to evaluate for kidney stones.  Doxycycline 100 mg twice daily x7 days for possible urethritis KUB today, call with results Follow-up cultures RTC 2 weeks symptom check  Nickolas Madrid, MD 04/26/2022  [facility] [address] [place], [place] [address] [phone]

## 2022-04-26 NOTE — Progress Notes (Signed)
HPI: 31 y.o. 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  RT foot surgery 05/28/2021. LT foot surgery 01/15/2021.  Patient is doing well.  She has been using the bone stimulator now for about 26 days.  She continues to have some pain and tenderness with swelling especially by the end of the day.  She also complains of thickened skin to the plantar heels bilateral with fissuring.  She experiences burning sensation as well.  She says that the oral antifungal pills have helped significantly with her nails but she continues to have symptomatic painful skin to the heels  Past Medical History:  Diagnosis Date   Anemia    Anxiety    Bipolar depression ([place])    Depression    Difficult intubation    x 1 with foot surgery   Dizziness    Migraines    Nausea    Rectal bleeding     Past Surgical History:  Procedure Laterality Date   HARDWARE REMOVAL Left 05/14/2021   Procedure: LEFT FOOT HARDWARE REMOVAL;  Surgeon: Edrick Kins, DPM;  Location: [facility];  Service: Podiatry;  Laterality: Left;   HARDWARE REMOVAL Right 07/23/2021   Procedure: HARDWARE REMOVAL;  Surgeon: Edrick Kins, DPM;  Location: [facility];  Service: Podiatry;  Laterality: Right;   METATARSAL OSTEOTOMY Left 01/15/2021   Procedure: METATARSAL OSTEOTOMY;  Surgeon: Edrick Kins, DPM;  Location: [facility];  Service: Podiatry;  Laterality: Left;   METATARSAL OSTEOTOMY Right 05/28/2021   Procedure: METATARSAL OSTEOTOMY FOURTH TOE RIGHT FOOT;  Surgeon: Edrick Kins, DPM;  Location: [facility];  Service: Podiatry;  Laterality: Right;   TONSILLECTOMY     age 31   WISDOM TOOTH EXTRACTION      Allergies  Allergen Reactions   Latex Rash     Physical Exam: General: The patient is alert and oriented x3 in no acute distress.  Dermatology: Skin is warm, dry and supple bilateral lower extremities. Negative for open lesions or  macerations.  Skin incisions healed.  Hypertrophic scar noted especially to the right foot.  There is some hyperkeratotic skin with superficial fissuring noted to the plantar heels bilateral  Vascular: Palpable pedal pulses bilaterally. Capillary refill within normal limits.  Negative for any significant edema or erythema.  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  Radiographic Exam B/L feet:  Right: Degenerative changes noted along the fourth ray of the bilateral foot.  Advanced degenerative changes noted to the fourth MTP joint with dorsal displacement of the toe right.  Osseous callus formation noted to the osteotomy site with distraction appears to be healing and improving with osseous reorganization and restructuring.  There continues to be nonunion however.  Clinically and radiographically there is no concern for osteomyelitis.  No acute fractures identified Left: There continues to be nonunion.  Demonstration of bone growth noted around the osteotomy site.  Joint spaces preserved.  No acute fractures identified  Assessment: 1. S/p brachymetatarsia surgery LT 01/15/2021 2. S/p brachymetatarsia surgery RT 05/28/2021 3.  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  Tinea pedis bilateral heels with superficial fissuring of skin   Plan of Care:  1. Patient evaluated. X-Rays reviewed which demonstrate complete nonunion of the osteotomy site to the fourth  metatarsal.  2.  Patient is very satisfied with the outcome of the left foot.  She no longer has any pain or tenderness associated to the left foot.   3.  She would like to proceed with revision of the right foot to bring the toe out to length.  She continues to have pain and tenderness on a daily basis with increased  shortening of the fourth toe. 4.  Again, risk benefits advantages and disadvantages of the procedure were explained.  This procedure will be primary lengthening with bone allograft.  This procedure was explained in detail.  She knows that she will be nonweightbearing for about 8 weeks.  Patient agrees and like to proceed 5.  Authorization for surgery was initiated today.  Surgery will consist of revisional osteotomy fourth metatarsal right with lengthening and bone allograft (requested Paragon 28) 6.  Return to clinic 1 week postop   Edrick Kins, DPM [facility]  Dr. Edrick Kins, DPM    [address] [place], [place] [address]                Office [phone]  Fax [phone]

## 2022-04-28 LAB — URINE CULTURE

## 2022-05-02 LAB — MISC LABCORP TEST (SEND OUT): Labcorp test code: 86884

## 2022-05-05 ENCOUNTER — Ambulatory Visit: Payer: Medicaid Other | Admitting: Nurse Practitioner

## 2022-05-06 ENCOUNTER — Telehealth: Payer: Self-pay | Admitting: Podiatry

## 2022-05-06 NOTE — Telephone Encounter (Signed)
Pharmacy [facility] in [place]   Medication terbinafine (LAMISIL) 250 MG tablet  patient called to medication filled

## 2022-05-09 ENCOUNTER — Other Ambulatory Visit: Payer: Self-pay | Admitting: Podiatry

## 2022-05-09 MED ORDER — TERBINAFINE HCL 250 MG PO TABS: 250.0000 mg | ORAL_TABLET | Freq: Every day | ORAL | 0 refills | Status: AC

## 2022-05-12 ENCOUNTER — Ambulatory Visit: Payer: Medicaid Other | Admitting: Physician Assistant

## 2022-05-18 ENCOUNTER — Other Ambulatory Visit: Payer: Self-pay

## 2022-05-18 ENCOUNTER — Encounter
Admission: RE | Admit: 2022-05-18 | Discharge: 2022-05-18 | Disposition: A | Discharge status: Home / Self Care | Payer: Medicaid Other | Source: Ambulatory Visit | Attending: Podiatry | Admitting: Podiatry

## 2022-05-18 VITALS — Ht 63.0 in | Wt 290.0 lb

## 2022-05-18 DIAGNOSIS — Z01818 Encounter for other preprocedural examination: Secondary | ICD-10-CM

## 2022-05-18 NOTE — Patient Instructions (Signed)
Your procedure is scheduled on: 05/27/22 Report to [place]. To find out your arrival time please call [phone] between 1PM - 3PM on 05/26/22.  Remember: Instructions that are not followed completely may result in serious medical risk, up to and including death, or upon the discretion of your surgeon and anesthesiologist your surgery may need to be rescheduled.     _X__ 1. Do not eat food or drink any liquids after midnight the night before your procedure.                 No gum chewing or hard candies.   __X__2.  On the morning of surgery brush your teeth with toothpaste and water, you                 may rinse your mouth with mouthwash if you wish.  Do not swallow any              toothpaste of mouthwash.     _X__ 3.  No Alcohol for 24 hours before or after surgery.   _X__ 4.  Do Not Smoke or use e-cigarettes For 24 Hours Prior to Your Surgery.                 Do not use any chewable tobacco products for at least 6 hours prior to                 surgery.  ____  5.  Bring all medications with you on the day of surgery if instructed.   __X__  6.  Notify your doctor if there is any change in your medical condition      (cold, fever, infections).     Do not wear jewelry, make-up, hairpins, clips or nail polish. Do not wear lotions, powders, or perfumes. You may use deodorant Do not shave body hair 48 hours prior to surgery. Men may shave face and neck. Do not bring valuables to the hospital.    [facility] is not responsible for any belongings or valuables.  Contacts, dentures/partials or body piercings may not be worn into surgery. Bring a case for your contacts, glasses or hearing aids, a denture cup will be supplied. Leave your suitcase in the car. After surgery it may be brought to your room. For patients admitted to the hospital, discharge time is determined by your treatment team.   Patients discharged the day of  surgery will not be allowed to drive home.    __X__ Take these medicines the morning of surgery with A SIP OF WATER:    1. sertraline (ZOLOFT) 100 MG tablet  2.   3.   4.  5.  6.  ____ Fleet Enema (as directed)   ____ Use CHG Soap/SAGE wipes as directed  ____ Use inhalers on the day of surgery  ____ Stop metformin/Janumet/Farxiga 2 days prior to surgery    ____ Take 1/2 of usual insulin dose the night before surgery. No insulin the morning          of surgery.   ____ Stop Blood Thinners Coumadin/Plavix/Xarelto/Pleta/Pradaxa/Eliquis/Effient/Aspirin  on   Or contact your Surgeon, Cardiologist or Medical Doctor regarding  ability to stop your blood thinners  __X__ Stop Anti-inflammatories 7 days before surgery such as Advil, Ibuprofen, Motrin,  BC or Goodies Powder, Naprosyn, Naproxen, Aleve, Aspirin    __X__ Stop all herbals and supplements, fish oil or vitamins for 7 days until after surgery.  ____ Bring C-Pap to the hospital.

## 2022-05-24 ENCOUNTER — Ambulatory Visit: Payer: Medicaid Other | Admitting: Internal Medicine

## 2022-05-26 MED ORDER — CEFAZOLIN SODIUM-DEXTROSE 2-4 GM/100ML-% IV SOLN
2.0000 g | INTRAVENOUS | Status: AC
  Administered 2022-05-27: 2 g via INTRAVENOUS

## 2022-05-26 MED ORDER — CHLORHEXIDINE GLUCONATE 0.12 % MT SOLN: 15.0000 mL | Freq: Once | OROMUCOSAL | Status: AC

## 2022-05-26 MED ORDER — LACTATED RINGERS IV SOLN: INTRAVENOUS | Status: DC

## 2022-05-26 MED ORDER — ORAL CARE MOUTH RINSE: 15.0000 mL | Freq: Once | OROMUCOSAL | Status: AC

## 2022-05-26 MED ORDER — FAMOTIDINE 20 MG PO TABS: 20.0000 mg | ORAL_TABLET | Freq: Once | ORAL | Status: AC

## 2022-05-27 ENCOUNTER — Other Ambulatory Visit: Payer: Self-pay

## 2022-05-27 ENCOUNTER — Ambulatory Visit: Payer: Medicaid Other | Admitting: Urgent Care

## 2022-05-27 ENCOUNTER — Ambulatory Visit
Admission: RE | Admit: 2022-05-27 | Discharge: 2022-05-27 | Disposition: A | Discharge status: Home / Self Care | Payer: Medicaid Other | Attending: Podiatry | Admitting: Podiatry

## 2022-05-27 ENCOUNTER — Ambulatory Visit: Payer: Medicaid Other

## 2022-05-27 ENCOUNTER — Encounter
Admission: RE | Disposition: A | Discharge status: Home / Self Care | Payer: Self-pay | Source: Home / Self Care | Attending: Podiatry

## 2022-05-27 ENCOUNTER — Encounter: Payer: Self-pay | Admitting: Podiatry

## 2022-05-27 DIAGNOSIS — B353 Tinea pedis: Secondary | ICD-10-CM | POA: Diagnosis not present

## 2022-05-27 DIAGNOSIS — Q666 Other congenital valgus deformities of feet: Secondary | ICD-10-CM | POA: Diagnosis not present

## 2022-05-27 DIAGNOSIS — Z6841 Body Mass Index (BMI) 40.0 and over, adult: Secondary | ICD-10-CM | POA: Diagnosis not present

## 2022-05-27 DIAGNOSIS — Y838 Other surgical procedures as the cause of abnormal reaction of the patient, or of later complication, without mention of misadventure at the time of the procedure: Secondary | ICD-10-CM | POA: Diagnosis not present

## 2022-05-27 DIAGNOSIS — Z01818 Encounter for other preprocedural examination: Secondary | ICD-10-CM

## 2022-05-27 DIAGNOSIS — M9689 Other intraoperative and postprocedural complications and disorders of the musculoskeletal system: Secondary | ICD-10-CM | POA: Diagnosis present

## 2022-05-27 DIAGNOSIS — Z9889 Other specified postprocedural states: Secondary | ICD-10-CM | POA: Insufficient documentation

## 2022-05-27 DIAGNOSIS — Q6689 Other  specified congenital deformities of feet: Secondary | ICD-10-CM | POA: Diagnosis not present

## 2022-05-27 HISTORY — PX: METATARSAL OSTEOTOMY: SHX1641

## 2022-05-27 LAB — POCT PREGNANCY, URINE: Preg Test, Ur: NEGATIVE

## 2022-05-27 SURGERY — OSTEOTOMY, METATARSAL BONE
Anesthesia: General | Site: Toe | Laterality: Right

## 2022-05-27 MED ORDER — FAMOTIDINE 20 MG PO TABS
ORAL_TABLET | ORAL | Status: AC
  Administered 2022-05-27: 20 mg via ORAL
  Filled 2022-05-27: qty 1

## 2022-05-27 MED ORDER — LIDOCAINE HCL (PF) 1 % IJ SOLN
INTRAMUSCULAR | Status: DC | PRN
  Administered 2022-05-27: 5 mL
  Administered 2022-05-27: 8 mL

## 2022-05-27 MED ORDER — PROPOFOL 1000 MG/100ML IV EMUL
INTRAVENOUS | Status: AC
  Filled 2022-05-27: qty 100

## 2022-05-27 MED ORDER — OXYCODONE-ACETAMINOPHEN 5-325 MG PO TABS: 1.0000 | ORAL_TABLET | ORAL | 0 refills | Status: DC | PRN

## 2022-05-27 MED ORDER — LIDOCAINE HCL (PF) 2 % IJ SOLN
INTRAMUSCULAR | Status: AC
  Filled 2022-05-27: qty 5

## 2022-05-27 MED ORDER — FENTANYL CITRATE (PF) 100 MCG/2ML IJ SOLN
INTRAMUSCULAR | Status: DC | PRN
  Administered 2022-05-27 (×4): 25 ug via INTRAVENOUS

## 2022-05-27 MED ORDER — VANCOMYCIN HCL 1000 MG IV SOLR
INTRAVENOUS | Status: AC
  Filled 2022-05-27: qty 20

## 2022-05-27 MED ORDER — KETAMINE HCL 50 MG/5ML IJ SOSY
PREFILLED_SYRINGE | INTRAMUSCULAR | Status: AC
  Filled 2022-05-27: qty 5

## 2022-05-27 MED ORDER — ONDANSETRON HCL 4 MG/2ML IJ SOLN
INTRAMUSCULAR | Status: DC | PRN
  Administered 2022-05-27: 4 mg via INTRAVENOUS

## 2022-05-27 MED ORDER — FENTANYL CITRATE (PF) 100 MCG/2ML IJ SOLN
25.0000 ug | INTRAMUSCULAR | Status: DC | PRN
  Administered 2022-05-27: 25 ug via INTRAVENOUS
  Administered 2022-05-27: 50 ug via INTRAVENOUS

## 2022-05-27 MED ORDER — MIDAZOLAM HCL 2 MG/2ML IJ SOLN
INTRAMUSCULAR | Status: AC
  Filled 2022-05-27: qty 2

## 2022-05-27 MED ORDER — FENTANYL CITRATE (PF) 100 MCG/2ML IJ SOLN
INTRAMUSCULAR | Status: AC
  Filled 2022-05-27: qty 2

## 2022-05-27 MED ORDER — CEFAZOLIN SODIUM-DEXTROSE 2-4 GM/100ML-% IV SOLN
INTRAVENOUS | Status: AC
  Filled 2022-05-27: qty 100

## 2022-05-27 MED ORDER — BUPIVACAINE HCL 0.5 % IJ SOLN
INTRAMUSCULAR | Status: DC | PRN
  Administered 2022-05-27: 5 mL

## 2022-05-27 MED ORDER — BUPIVACAINE HCL (PF) 0.5 % IJ SOLN
INTRAMUSCULAR | Status: AC
  Filled 2022-05-27: qty 30

## 2022-05-27 MED ORDER — MIDAZOLAM HCL 2 MG/2ML IJ SOLN
INTRAMUSCULAR | Status: DC | PRN
  Administered 2022-05-27: 2 mg via INTRAVENOUS

## 2022-05-27 MED ORDER — PROPOFOL 500 MG/50ML IV EMUL
INTRAVENOUS | Status: DC | PRN
  Administered 2022-05-27: 200 ug/kg/min via INTRAVENOUS

## 2022-05-27 MED ORDER — OXYCODONE-ACETAMINOPHEN 5-325 MG PO TABS
ORAL_TABLET | ORAL | Status: AC
  Filled 2022-05-27: qty 1

## 2022-05-27 MED ORDER — CHLORHEXIDINE GLUCONATE 0.12 % MT SOLN
OROMUCOSAL | Status: AC
  Administered 2022-05-27: 15 mL via OROMUCOSAL
  Filled 2022-05-27: qty 15

## 2022-05-27 MED ORDER — LIDOCAINE HCL (PF) 1 % IJ SOLN
INTRAMUSCULAR | Status: AC
  Filled 2022-05-27: qty 30

## 2022-05-27 MED ORDER — 0.9 % SODIUM CHLORIDE (POUR BTL) OPTIME
TOPICAL | Status: DC | PRN
  Administered 2022-05-27: 1000 mL

## 2022-05-27 MED ORDER — GLYCOPYRROLATE 0.2 MG/ML IJ SOLN
INTRAMUSCULAR | Status: AC
  Filled 2022-05-27: qty 1

## 2022-05-27 MED ORDER — OXYCODONE-ACETAMINOPHEN 5-325 MG PO TABS
1.0000 | ORAL_TABLET | ORAL | Status: DC | PRN
  Administered 2022-05-27 (×2): 1 via ORAL

## 2022-05-27 MED ORDER — NEOMYCIN-POLYMYXIN B GU 40-200000 IR SOLN
Status: AC
  Filled 2022-05-27: qty 20

## 2022-05-27 SURGICAL SUPPLY — 64 items
1.2mm wire IMPLANT
BIT DRILL 1.3 (BIT) ×1
BIT DRILL 100X1.3XAO CNCT (BIT) IMPLANT
BIT DRL 100X1.3XAO CNCT (BIT) ×1
BLADE MED AGGRESSIVE (BLADE) ×1 IMPLANT
BLADE OSC/SAGITTAL MD 5.5X18 (BLADE) IMPLANT
BLADE SURG 15 STRL LF DISP TIS (BLADE) IMPLANT
BLADE SURG 15 STRL SS (BLADE)
BLADE SURG MINI STRL (BLADE) IMPLANT
BNDG CMPR 75X21 PLY HI ABS (MISCELLANEOUS)
BNDG ELASTIC 4X5.8 VLCR STR LF (GAUZE/BANDAGES/DRESSINGS) ×1 IMPLANT
BNDG ESMARK 4X12 TAN STRL LF (GAUZE/BANDAGES/DRESSINGS) ×1 IMPLANT
BNDG GAUZE DERMACEA FLUFF 4 (GAUZE/BANDAGES/DRESSINGS) ×1 IMPLANT
BNDG GZE DERMACEA 4 6PLY (GAUZE/BANDAGES/DRESSINGS) ×1
COVER PIN YLW 0.028-062 (MISCELLANEOUS) IMPLANT
CUFF TOURN DUAL QUICK 24 (TOURNIQUET CUFF) IMPLANT
CUFF TOURN SGL QUICK 12 (TOURNIQUET CUFF) IMPLANT
CUFF TOURN SGL QUICK 18X4 (TOURNIQUET CUFF) IMPLANT
CUFF TOURN SGL QUICK 24 (TOURNIQUET CUFF)
CUFF TRNQT CYL 24X4X16.5-23 (TOURNIQUET CUFF) IMPLANT
DRAPE FLUOR MINI C-ARM 54X84 (DRAPES) IMPLANT
DURAPREP 26ML APPLICATOR (WOUND CARE) ×1 IMPLANT
ELECT REM PT RETURN 9FT ADLT (ELECTROSURGICAL) ×1
ELECTRODE REM PT RTRN 9FT ADLT (ELECTROSURGICAL) ×1 IMPLANT
GAUZE SPONGE 4X4 12PLY STRL (GAUZE/BANDAGES/DRESSINGS) ×2 IMPLANT
GAUZE STRETCH 2X75IN STRL (MISCELLANEOUS) IMPLANT
GAUZE XEROFORM 1X8 LF (GAUZE/BANDAGES/DRESSINGS) ×1 IMPLANT
GLOVE BIO SURGEON STRL SZ8 (GLOVE) ×1 IMPLANT
GLOVE BIOGEL PI IND STRL 8 (GLOVE) ×1 IMPLANT
GOWN STRL REUS W/ TWL LRG LVL3 (GOWN DISPOSABLE) ×2 IMPLANT
GOWN STRL REUS W/TWL LRG LVL3 (GOWN DISPOSABLE) ×2
GRAFT AVITRAC MTP SZ 13 (Tissue) IMPLANT
GUIDEWIRE SMOOTH 1.2X100 (WIRE) IMPLANT
KIT TURNOVER KIT A (KITS) ×1 IMPLANT
LABEL OR SOLS (LABEL) ×1 IMPLANT
MANIFOLD NEPTUNE II (INSTRUMENTS) ×1 IMPLANT
NDL FILTER BLUNT 18X1 1/2 (NEEDLE) ×1 IMPLANT
NDL HYPO 25X1 1.5 SAFETY (NEEDLE) ×2 IMPLANT
NEEDLE FILTER BLUNT 18X1 1/2 (NEEDLE) ×1 IMPLANT
NEEDLE HYPO 25X1 1.5 SAFETY (NEEDLE) ×2 IMPLANT
NS IRRIG 500ML POUR BTL (IV SOLUTION) ×1 IMPLANT
PACK EXTREMITY ARMC (MISCELLANEOUS) ×1 IMPLANT
PLATE OBLIQUE 8H (Plate) IMPLANT
SCREW LOCKING 2.0X12 (Screw) IMPLANT
SCREW LOCKING 2.0X13 (Screw) IMPLANT
SCREW LOCKING 2.0X14 (Screw) IMPLANT
SOL PREP PVP 2OZ (MISCELLANEOUS) ×1
SOLUTION PREP PVP 2OZ (MISCELLANEOUS) ×1 IMPLANT
STAPLER SKIN PROX 35W (STAPLE) IMPLANT
STIMULATOR BONE (ORTHOPEDIC SUPPLIES) ×1
STIMULATOR BONE GROWTH EMG EXT (ORTHOPEDIC SUPPLIES) IMPLANT
STOCKINETTE STRL 6IN 960660 (GAUZE/BANDAGES/DRESSINGS) ×1 IMPLANT
SUT ETHILON 3-0 FS-10 30 BLK (SUTURE) ×1
SUT PROLENE 4 0 PS 2 18 (SUTURE) IMPLANT
SUT VIC AB 3-0 SH 27 (SUTURE) ×1
SUT VIC AB 3-0 SH 27X BRD (SUTURE) ×1 IMPLANT
SUT VIC AB 4-0 PS2 18 (SUTURE) IMPLANT
SUTURE EHLN 3-0 FS-10 30 BLK (SUTURE) ×2 IMPLANT
SYR 10ML LL (SYRINGE) ×1 IMPLANT
TRAP FLUID SMOKE EVACUATOR (MISCELLANEOUS) ×1 IMPLANT
WATER STERILE IRR 500ML POUR (IV SOLUTION) ×1 IMPLANT
WIRE OLIVE SMOOTH 1.3 (WIRE) IMPLANT
WIRE Z .045 C-WIRE SPADE TIP (WIRE) IMPLANT
WIRE Z .062 C-WIRE SPADE TIP (WIRE) IMPLANT

## 2022-05-27 NOTE — H&P (Signed)
Pre Procedure note for patients:   Maria Espinoza has been scheduled for Procedure(s): METATARSAL OSTEOTOMY FOURTH TOE (Right) today. The various methods of treatment have been discussed with the patient. After consideration of the risks, benefits and treatment options the patient has consented to the planned procedure.   The patient has been seen and labs reviewed. There are no changes in the patient's condition to prevent proceeding with the planned procedure today.  Felecia Shelling, DPM [facility]  Dr. Felecia Shelling, DPM    [address] [place], [place] [address]                Office [phone]  Fax [phone]

## 2022-05-27 NOTE — Discharge Instructions (Signed)
AMBULATORY SURGERY  DISCHARGE INSTRUCTIONS   The drugs that you were given will stay in your system until tomorrow so for the next 24 hours you should not:  Drive an automobile Make any legal decisions Drink any alcoholic beverage   You may resume regular meals tomorrow.  Today it is better to start with liquids and gradually work up to solid foods.  You may eat anything you prefer, but it is better to start with liquids, then soup and crackers, and gradually work up to solid foods.   Please notify your doctor immediately if you have any unusual bleeding, trouble breathing, redness and pain at the surgery site, drainage, fever, or pain not relieved by medication.    Additional Instructions: follow Dr Logan Bores preprinted dc instructions        Please contact your physician with any problems or Same Day Surgery at [phone], Monday through Friday 6 am to 4 pm, or [place] at [facility] number at [phone].

## 2022-05-27 NOTE — Op Note (Signed)
OPERATIVE REPORT Patient name: Maria Espinoza MRN: [id number] DOB: 11/08/1990  DOS: 05/27/22  Preop Dx: brachymetatarsia right foot Postop Dx: same  Procedure:  1.  Fourth metatarsal osteotomy with lengthening and application of bone allograft right foot  Surgeon: Felecia Shelling DPM  Anesthesia: Monitored anesthesia care with 50-50 mixture of 2% lidocaine plain with 0.5% Marcaine plain totaling 20 infiltrated in the patient's right lower extremity and ankle block fashion  Hemostasis: Calf tourniquet inflated to a pressure of after esmarch exsanguination   EBL: Minimal mL Materials: Paragon28 t-plate w/ respective screws.  Paragon 28 bone allograft. Orthofix bone stimulator Injectables: None Pathology: None  Condition: The patient tolerated the procedure and anesthesia well. No complications noted or reported   Justification for procedure: The patient is a 31 y.o. 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  The patient was told benefits as well as possible side effects of the surgery. The patient consented for surgical correction. The patient consent form was reviewed. All patient questions were answered. No guarantees were expressed or implied. The patient and the surgeon both signed the patient consent form with the witness present and placed in the patient's chart.   Procedure in Detail: The patient was brought to the operating room, placed in the operating table in the supine position at which time an aseptic scrub and drape were performed about the patient's respective lower extremity after anesthesia was induced as described above. Attention was then directed to the surgical area where procedure number one commenced.  Procedure #1: Fourth metatarsal osteotomy with lengthening and application of bone allograft right foot An 8 cm linear longitudinal skin incision was planned and made  overlying the fourth metatarsal of the right foot.  The incision was carried down to the level of metatarsal bone with care taken to Complicate retract away all small neurovascular structures traversing the incision site.  The fourth metatarsal was identified and careful soft tissue was reflected away from the fourth metatarsal using a #15 scalpel.  The fourth MTP joint was also identified.  There was advanced degenerative changes noted on preoperative x-ray and intraoperatively it was discovered that the fourth MTP had fused.  A small osteotome was utilized to mobilize the fourth MTP and McClamary elevator was inserted to release the plantar apparatus of the fourth MTP and mobilized the toe which was sitting dorsal to the head of the fourth metatarsal.  The toe was plantarflexed and a 0.62 inch percutaneous K wire was driven through the fourth ray for temporary stabilization.  The most proximal portion of the K wire was just distal to the nonunion/previous osteotomy site along the fourth metatarsal.  This was verified by intraoperative x-ray fluoroscopy. Osteotomy was then created using a sagittal blade bone sagittal saw essentially resecting out the nonunion of the fourth metatarsal.  After appropriate resection in preparation of the osteotomy site the osteotomy site was distracted to allow for a 15 mm bone allograft which was placed within the osteotomy site which allowed for primary lengthening of the fourth metatarsal and fourth toe.  With distraction the EDL was very tight and limiting the distraction of the toe.  Tenotomy of the EDL tendon was performed which relaxed the soft tissue and allowed for appropriate distraction at the osteotomy site. After appropriate placement of the bone allograft which was verified by intraoperative x-ray fluoroscopy the 0.62 K wire was driven more proximal to stabilize the fourth ray  of the foot.  This allowed for permanent internal fixation using Paragon 28 T plate with  respective screws which again were verified by intraoperative x-ray fluoroscopy and inserted in the standard AO fashion.   The percutaneous portion of the K wire extending out of the distal tip of the toe was bent dorsal and With a synthetic ball Placed of the percutaneous portion.  The toe was distracted distally to a nice length to the adjacent toes.  Copious irrigation was utilized and primary closure was achieved using a combination of 3-0 Vicryl 4-0 Vicryl and 4-0 Prolene suture.  Dry sterile compressive dressings were then applied.  Orthofix bone stimulator was also applied to the patient's foot. The tourniquet which was used for hemostasis was deflated. All normal neurovascular responses including pink color and warmth returned all the digits of patient's lower extremity.  The patient was then transferred from the operating room to the recovery room having tolerated the procedure and anesthesia well. All vital signs are stable. After a brief stay in the recovery room the patient was discharged with adequate prescriptions for analgesia. Verbal as well as written instructions were provided for the patient regarding wound care. The patient is to keep the dressings clean dry and intact until they are to follow surgeon Dr. Gala Lewandowsky in the office upon discharge.   Impression: Bone quality was solid and appeared to be healthy.  Good potential for healing.  Felecia Shelling, DPM [facility]  Dr. Felecia Shelling, DPM    [address] [place], [place] [address]                Office [phone]  Fax [phone]

## 2022-05-27 NOTE — Transfer of Care (Signed)
Immediate Anesthesia Transfer of Care Note  Patient: Maria Espinoza  Procedure(s) Performed: METATARSAL OSTEOTOMY FOURTH TOE (Right: Toe)  Patient Location: PACU  Anesthesia Type:General  Level of Consciousness: awake and sedated  Airway & Oxygen Therapy: Patient Spontanous Breathing and Patient connected to face mask oxygen  Post-op Assessment: Report given to RN and Post -op Vital signs reviewed and stable  Post vital signs: Reviewed and stable  Last Vitals:  Vitals Value Taken Time  BP    Temp    Pulse    Resp 20 05/27/22 1810  SpO2    Vitals shown include unvalidated device data.  Last Pain:  Vitals:   05/27/22 1419  TempSrc: Temporal  PainSc: 0-No pain         Complications: No notable events documented.

## 2022-05-27 NOTE — Anesthesia Preprocedure Evaluation (Signed)
Anesthesia Evaluation  Patient identified by MRN, date of birth, ID band Patient awake    Reviewed: Allergy & Precautions, NPO status , Patient's Chart, lab work & pertinent test results  History of Anesthesia Complications (+) DIFFICULT AIRWAY and history of anesthetic complications  Airway Mallampati: III  TM Distance: >3 FB Neck ROM: full    Dental  (+) Chipped, Dental Advidsory Given   Pulmonary neg shortness of breath, neg recent URI, Current Smoker and Patient abstained from smoking.   Pulmonary exam normal        Cardiovascular (-) angina (-) Past MI negative cardio ROS Normal cardiovascular exam     Neuro/Psych  Headaches, neg Seizures PSYCHIATRIC DISORDERS Anxiety Depression Bipolar Disorder      GI/Hepatic negative GI ROS, Neg liver ROS,neg GERD  ,,  Endo/Other  neg diabetes  Morbid obesity  Renal/GU negative Renal ROS  negative genitourinary   Musculoskeletal   Abdominal   Peds  Hematology negative hematology ROS (+)   Anesthesia Other Findings Past Medical History: No date: Anemia No date: Anxiety No date: Bipolar depression (HCC) No date: Depression No date: Difficult intubation     Comment:  x 1 with foot surgery No date: Dizziness No date: Migraines No date: Nausea No date: Rectal bleeding  Past Surgical History: 05/14/2021: HARDWARE REMOVAL; Left     Comment:  Procedure: LEFT FOOT HARDWARE REMOVAL;  Surgeon: Felecia Shelling, DPM;  Location: [facility];  Service: Podiatry;                Laterality: Left; 01/15/2021: METATARSAL OSTEOTOMY; Left     Comment:  Procedure: METATARSAL OSTEOTOMY;  Surgeon: Felecia Shelling, DPM;  Location: [facility];  Service: Podiatry;                Laterality: Left; 05/28/2021: METATARSAL OSTEOTOMY; Right     Comment:  Procedure: METATARSAL OSTEOTOMY FOURTH TOE RIGHT FOOT;                Surgeon: Felecia Shelling, DPM;  Location: [facility];                 Service: Podiatry;  Laterality: Right; No date: TONSILLECTOMY     Comment:  age 31 No date: WISDOM TOOTH EXTRACTION  BMI    Body Mass Index: 54.36 kg/m      Reproductive/Obstetrics negative OB ROS                             Anesthesia Physical Anesthesia Plan  ASA: 3  Anesthesia Plan: General   Post-op Pain Management:    Induction: Intravenous  PONV Risk Score and Plan: Propofol infusion and TIVA  Airway Management Planned: Natural Airway and Nasal Cannula  Additional Equipment:   Intra-op Plan:   Post-operative Plan:   Informed Consent: I have reviewed the patients History and Physical, chart, labs and discussed the procedure including the risks, benefits and alternatives for the proposed anesthesia with the patient or authorized representative who has indicated his/her understanding and acceptance.     Dental Advisory Given  Plan Discussed with: Anesthesiologist, CRNA and Surgeon  Anesthesia Plan Comments: (Patient consented for risks of anesthesia including but not limited to:  - adverse reactions to medications - risk of airway placement if required -  damage to eyes, teeth, lips or other oral mucosa - nerve damage due to positioning  - sore throat or hoarseness - Damage to heart, brain, nerves, lungs, other parts of body or loss of life  Patient voiced understanding.)        Anesthesia Quick Evaluation

## 2022-05-27 NOTE — Brief Op Note (Signed)
05/27/2022  6:04 PM  PATIENT:  Maria Espinoza  31 y.o. 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  PROCEDURE:  Procedure(s): METATARSAL OSTEOTOMY FOURTH TOE (Right)  SURGEON:  Surgeon(s) and Role:    Edrick Kins, DPM - Primary  PHYSICIAN ASSISTANT: none  ASSISTANTS: none   ANESTHESIA:   Local + MAC  EBL:  5 mL   BLOOD ADMINISTERED:none  DRAINS: none   LOCAL MEDICATIONS USED:  MARCAINE   , LIDOCAINE , and Amount: 20 ml  SPECIMEN:  No Specimen  DISPOSITION OF SPECIMEN:  N/A  COUNTS:  YES  TOURNIQUET:   Total Tourniquet Time Documented: Calf (Right) - 89 minutes Total: Calf (Right) - 89 minutes   DICTATION: .Viviann Spare Dictation  PLAN OF CARE: Discharge to home after PACU  PATIENT DISPOSITION:  PACU - hemodynamically stable.   Delay start of Pharmacological VTE agent (>24hrs) due to surgical blood loss or risk of bleeding: not applicable

## 2022-05-27 NOTE — Interval H&P Note (Signed)
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  The various methods of treatment have been discussed with the patient and family. After consideration of risks, benefits and other options for treatment, the patient has consented to  Procedure(s): METATARSAL OSTEOTOMY FOURTH TOE (Right) as a surgical intervention.  The patient's history has been reviewed, patient examined, no change in status, stable for surgery.  I have reviewed the patient's chart and labs.  Questions were answered to the patient's satisfaction.     Felecia Shelling

## 2022-05-27 NOTE — Anesthesia Postprocedure Evaluation (Signed)
Anesthesia Post Note  Patient: Maria Espinoza  Procedure(s) Performed: METATARSAL OSTEOTOMY FOURTH TOE (Right: Toe)  Patient location during evaluation: PACU Anesthesia Type: General Level of consciousness: awake and alert Pain management: pain level controlled Vital Signs Assessment: post-procedure vital signs reviewed and stable Respiratory status: spontaneous breathing, nonlabored ventilation, respiratory function stable and patient connected to nasal cannula oxygen Cardiovascular status: blood pressure returned to baseline and stable Postop Assessment: no apparent nausea or vomiting Anesthetic complications: no   No notable events documented.   Last Vitals:  Vitals:   05/27/22 1847 05/27/22 1848  BP: 127/83 127/83  Pulse: (!) 50 (!) 58  Resp: 20 18  Temp: (!) 36.1 C (!) 36.1 C  SpO2: 99% 100%    Last Pain:  Vitals:   05/27/22 1848  TempSrc:   PainSc: 0-No pain                 Corinda Gubler

## 2022-06-01 ENCOUNTER — Encounter: Payer: Self-pay | Admitting: Podiatry

## 2022-06-02 ENCOUNTER — Encounter: Payer: Self-pay | Admitting: Podiatry

## 2022-06-03 ENCOUNTER — Other Ambulatory Visit (INDEPENDENT_AMBULATORY_CARE_PROVIDER_SITE_OTHER): Payer: Self-pay | Admitting: Podiatry

## 2022-06-03 ENCOUNTER — Other Ambulatory Visit: Payer: Self-pay | Admitting: Podiatry

## 2022-06-03 ENCOUNTER — Encounter: Payer: Medicaid Other | Admitting: Podiatry

## 2022-06-03 ENCOUNTER — Telehealth: Payer: Self-pay | Admitting: Podiatry

## 2022-06-03 MED ORDER — OXYCODONE-ACETAMINOPHEN 5-325 MG PO TABS: 1.0000 | ORAL_TABLET | ORAL | 0 refills | Status: DC | PRN

## 2022-06-03 NOTE — Telephone Encounter (Signed)
Patient is calling and saying she is out of pain meds now and she is in severe pain and would like a refill.  Please advvise

## 2022-06-03 NOTE — Telephone Encounter (Signed)
Pt called and has a emergency and is not able to make appt today so I r/s to next Tuesday with Dr Allena Katz.  She also only has 2 pain pills left and would like a refill sent in please. She does not want to go the weekend without any pain medication.

## 2022-06-03 NOTE — Telephone Encounter (Signed)
Refill sent.  Thanks, Dr. Kellina Dreese

## 2022-06-03 NOTE — Interval H&P Note (Signed)
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  The various methods of treatment have been discussed with the patient and family. After consideration of risks, benefits and other options for treatment, the patient has consented to  Procedure(s): METATARSAL OSTEOTOMY FOURTH TOE (Right) as a surgical intervention.  The patient's history has been reviewed, patient examined, no change in status, stable for surgery.  I have reviewed the patient's chart and labs.  Questions were answered to the patient's satisfaction.     Felecia Shelling

## 2022-06-07 ENCOUNTER — Ambulatory Visit (INDEPENDENT_AMBULATORY_CARE_PROVIDER_SITE_OTHER): Payer: Medicaid Other | Admitting: Podiatry

## 2022-06-07 ENCOUNTER — Ambulatory Visit (INDEPENDENT_AMBULATORY_CARE_PROVIDER_SITE_OTHER): Payer: Medicaid Other

## 2022-06-07 DIAGNOSIS — Q72891 Other reduction defects of right lower limb: Secondary | ICD-10-CM

## 2022-06-07 DIAGNOSIS — Z9889 Other specified postprocedural states: Secondary | ICD-10-CM

## 2022-06-07 DIAGNOSIS — M9689 Other intraoperative and postprocedural complications and disorders of the musculoskeletal system: Secondary | ICD-10-CM

## 2022-06-07 DIAGNOSIS — M216X1 Other acquired deformities of right foot: Secondary | ICD-10-CM

## 2022-06-07 MED ORDER — OXYCODONE-ACETAMINOPHEN 5-325 MG PO TABS: 1.0000 | ORAL_TABLET | ORAL | 0 refills | Status: DC | PRN

## 2022-06-07 NOTE — Progress Notes (Signed)
  Subjective:  Patient ID: Maria Espinoza, female    DOB: 25-Mar-1991,  MRN: [id number]  Chief Complaint  Patient presents with   Routine Post Op    POV #1 DOS 05/27/2022 4TH METATARSAL REVISIONAL OSTEOTOMY LENGTHENING W/BONE ALLOGRAFT IMPLAN    DOS: 05/27/2022 Procedure: Left fourth metatarsal revision osteotomy with bone lengthening  31 y.o. female returns for post-op check.  Patient states she is doing well pain is minimally controlled.  She states that it looks much more straighter and more than 30.  Bandages clean dry and intact  Review of Systems: Negative except as noted in the HPI. Denies N/V/F/Ch.  Past Medical History:  Diagnosis Date   Anemia    Anxiety    Bipolar depression (HCC)    Depression    Difficult intubation    x 1 with foot surgery   Dizziness    Migraines    Nausea    Rectal bleeding     Current Outpatient Medications:    clotrimazole-betamethasone (LOTRISONE) cream, Apply 1 Application topically 2 (two) times daily., Disp: 45 g, Rfl: 1   oxyCODONE-acetaminophen (PERCOCET) 5-325 MG tablet, Take 1 tablet by mouth every 4 (four) hours as needed for severe pain., Disp: 30 tablet, Rfl: 0   sertraline (ZOLOFT) 100 MG tablet, Take 100 mg by mouth daily., Disp: , Rfl:    terbinafine (LAMISIL) 250 MG tablet, Take 1 tablet (250 mg total) by mouth daily., Disp: 30 tablet, Rfl: 0   traZODone (DESYREL) 150 MG tablet, Take 150 mg by mouth at bedtime as needed for sleep., Disp: , Rfl:    Vitamin D, Ergocalciferol, (DRISDOL) 1.25 MG (50000 UNIT) CAPS capsule, Take 1 capsule (50,000 Units total) by mouth every 7 (seven) days., Disp: 12 capsule, Rfl: 1   VRAYLAR 3 MG capsule, Take 3 mg by mouth at bedtime., Disp: , Rfl:   Social History   Tobacco Use  Smoking Status Some Days   Packs/day: 0.25   Years: 3.00   Total pack years: 0.75   Types: Cigarettes   Passive exposure: Current  Smokeless Tobacco Never    Allergies  Allergen Reactions   Latex Rash    Objective:  There were no vitals filed for this visit. There is no height or weight on file to calculate BMI. Constitutional Well developed. Well nourished.  Vascular Foot warm and well perfused. Capillary refill normal to all digits.   Neurologic Normal speech. Oriented to person, place, and time. Epicritic sensation to light touch grossly present bilaterally.  Dermatologic Skin healing well without signs of infection. Skin edges well coapted without signs of infection.  Orthopedic: Tenderness to palpation noted about the surgical site.   Radiographs: 3 views of skeletally mature adult left foot: Hardware is intact no signs of backing out or loosening noted.  Graft is well incorporating. Assessment:   1. Status post surgery    Plan:  Patient was evaluated and treated and all questions answered.  S/p foot surgery left -Progressing as expected post-operatively. -XR: See above -WB Status: Nonweightbearing to the left lower extremity -Sutures: Intact.  No signs of dehiscence noted no complication noted. -Medications: None -Foot redressed.  No follow-ups on file.

## 2022-06-13 ENCOUNTER — Other Ambulatory Visit: Payer: Self-pay | Admitting: Podiatry

## 2022-06-14 ENCOUNTER — Ambulatory Visit (INDEPENDENT_AMBULATORY_CARE_PROVIDER_SITE_OTHER): Payer: Medicaid Other | Admitting: Podiatry

## 2022-06-14 DIAGNOSIS — Z9889 Other specified postprocedural states: Secondary | ICD-10-CM

## 2022-06-14 MED ORDER — OXYCODONE-ACETAMINOPHEN 5-325 MG PO TABS: 1.0000 | ORAL_TABLET | Freq: Four times a day (QID) | ORAL | 0 refills | Status: DC | PRN

## 2022-06-14 NOTE — Progress Notes (Signed)
   Chief Complaint  Patient presents with   post op visit    TH METATARSAL REVISIONAL OSTEOTOMY LENGTHENING W/BONE ALLOGRAFT IMPLANT DOS 05/27/22 RT, the patient states that she is still having some pain and would like a refill on pain medication.    Subjective:  Patient presents today status post fourth metatarsal osteotomy/lengthening with bone allograft and internal fixation. DOS: 05/27/2022.  Patient states that she is doing well.  Minimal pain.  She is minimally weightbearing in the cam boot as instructed.  No new complaints at this time  Past Medical History:  Diagnosis Date   Anemia    Anxiety    Bipolar depression (HCC)    Depression    Difficult intubation    x 1 with foot surgery   Dizziness    Migraines    Nausea    Rectal bleeding     Past Surgical History:  Procedure Laterality Date   HARDWARE REMOVAL Left 05/14/2021   Procedure: LEFT FOOT HARDWARE REMOVAL;  Surgeon: Felecia Shelling, DPM;  Location: [facility];  Service: Podiatry;  Laterality: Left;   HARDWARE REMOVAL Right 07/23/2021   Procedure: HARDWARE REMOVAL;  Surgeon: Felecia Shelling, DPM;  Location: [facility];  Service: Podiatry;  Laterality: Right;   METATARSAL OSTEOTOMY Left 01/15/2021   Procedure: METATARSAL OSTEOTOMY;  Surgeon: Felecia Shelling, DPM;  Location: [facility];  Service: Podiatry;  Laterality: Left;   METATARSAL OSTEOTOMY Right 05/28/2021   Procedure: METATARSAL OSTEOTOMY FOURTH TOE RIGHT FOOT;  Surgeon: Felecia Shelling, DPM;  Location: [facility];  Service: Podiatry;  Laterality: Right;   METATARSAL OSTEOTOMY Right 05/27/2022   Procedure: METATARSAL OSTEOTOMY FOURTH TOE;  Surgeon: Felecia Shelling, DPM;  Location: [facility];  Service: Podiatry;  Laterality: Right;   TONSILLECTOMY     age 31   WISDOM TOOTH EXTRACTION      Allergies  Allergen Reactions   Latex Rash    Objective/Physical Exam Neurovascular status intact.  Skin incisions appear to be well coapted with sutures intact. No sign of  infectious process noted. No dehiscence. No active bleeding noted. Moderate edema noted to the surgical extremity.  Assessment: 1. s/p fourth metatarsal osteotomy with bone allograft lengthening and internal fixation RT foot. DOS: 05/27/2022   Plan of Care:  1. Patient was evaluated. X-rays reviewed taken last visit 2.  Sutures removed 3.  Continue minimal WB in the cam boot heel touch 4.  Return to clinic 2 weeks for follow-up x-ray    Felecia Shelling, DPM [facility]  Dr. Felecia Shelling, DPM    [address] [place], [place] [address]                Office [phone]  Fax [phone]

## 2022-06-14 NOTE — Addendum Note (Signed)
Addended by: Felecia Shelling on: 06/14/2022 12:00 PM   Modules accepted: Orders

## 2022-06-20 ENCOUNTER — Telehealth: Payer: Self-pay | Admitting: Podiatry

## 2022-06-20 NOTE — Telephone Encounter (Signed)
Pt called to request a Rx refill on her pain medicine. Please advise

## 2022-06-21 ENCOUNTER — Other Ambulatory Visit: Payer: Self-pay | Admitting: Podiatry

## 2022-06-21 MED ORDER — OXYCODONE-ACETAMINOPHEN 5-325 MG PO TABS: 1.0000 | ORAL_TABLET | Freq: Four times a day (QID) | ORAL | 0 refills | Status: DC | PRN

## 2022-06-21 NOTE — Telephone Encounter (Signed)
Refill sent to the pharmacy.  Thanks, Dr. Newell Wafer

## 2022-06-21 NOTE — Progress Notes (Signed)
As needed postop pain 

## 2022-06-28 ENCOUNTER — Ambulatory Visit (INDEPENDENT_AMBULATORY_CARE_PROVIDER_SITE_OTHER): Payer: Medicaid Other

## 2022-06-28 ENCOUNTER — Ambulatory Visit (INDEPENDENT_AMBULATORY_CARE_PROVIDER_SITE_OTHER): Payer: Medicaid Other | Admitting: Podiatry

## 2022-06-28 ENCOUNTER — Encounter: Payer: Self-pay | Admitting: *Deleted

## 2022-06-28 DIAGNOSIS — Z9889 Other specified postprocedural states: Secondary | ICD-10-CM

## 2022-06-28 DIAGNOSIS — M216X1 Other acquired deformities of right foot: Secondary | ICD-10-CM | POA: Diagnosis not present

## 2022-06-28 MED ORDER — OXYCODONE-ACETAMINOPHEN 5-325 MG PO TABS: 1.0000 | ORAL_TABLET | Freq: Three times a day (TID) | ORAL | 0 refills | Status: DC | PRN

## 2022-06-28 NOTE — Addendum Note (Signed)
Addended by: Felecia Shelling on: 06/28/2022 01:14 PM   Modules accepted: Orders

## 2022-06-28 NOTE — Progress Notes (Signed)
   Chief Complaint  Patient presents with   Post-op Follow-up     Post op #3 DOS 05/27/2022 4TH METATARSAL REVISIONAL OSTEOTOMY LENGTHENING W/BONE ALLOGRAFT IMPLANT    Subjective:  Patient presents today status post fourth metatarsal osteotomy/lengthening with bone allograft and internal fixation. DOS: 05/27/2022.  Patient doing well.  She presents today wearing slippers.  She is full weightbearing.  Patient states that yesterday the percutaneous fixation pin was sticking out and so she went ahead and removed it.  No new complaints at this time  Past Medical History:  Diagnosis Date   Anemia    Anxiety    Bipolar depression (HCC)    Depression    Difficult intubation    x 1 with foot surgery   Dizziness    Migraines    Nausea    Rectal bleeding     Past Surgical History:  Procedure Laterality Date   HARDWARE REMOVAL Left 05/14/2021   Procedure: LEFT FOOT HARDWARE REMOVAL;  Surgeon: Felecia Shelling, DPM;  Location: [facility];  Service: Podiatry;  Laterality: Left;   HARDWARE REMOVAL Right 07/23/2021   Procedure: HARDWARE REMOVAL;  Surgeon: Felecia Shelling, DPM;  Location: [facility];  Service: Podiatry;  Laterality: Right;   METATARSAL OSTEOTOMY Left 01/15/2021   Procedure: METATARSAL OSTEOTOMY;  Surgeon: Felecia Shelling, DPM;  Location: [facility];  Service: Podiatry;  Laterality: Left;   METATARSAL OSTEOTOMY Right 05/28/2021   Procedure: METATARSAL OSTEOTOMY FOURTH TOE RIGHT FOOT;  Surgeon: Felecia Shelling, DPM;  Location: [facility];  Service: Podiatry;  Laterality: Right;   METATARSAL OSTEOTOMY Right 05/27/2022   Procedure: METATARSAL OSTEOTOMY FOURTH TOE;  Surgeon: Felecia Shelling, DPM;  Location: [facility];  Service: Podiatry;  Laterality: Right;   TONSILLECTOMY     age 31   WISDOM TOOTH EXTRACTION      Allergies  Allergen Reactions   Latex Rash    Objective/Physical Exam Neurovascular status intact.  Skin incisions healed.  Moderate edema noted.  There is some interdigital  maceration with superficial skin breakdown to the interdigital areas of the toes.  Radiographic exam RT foot 06/28/2022 Orthopedic hardware stable and intact.  Bone allograft also in good alignment.  Degenerative changes noted to the fourth MTP.  Complete removal of the percutaneous K wire noted.  Assessment: 1. s/p fourth metatarsal osteotomy with bone allograft lengthening and internal fixation RT foot. DOS: 05/27/2022 2.  Interdigital maceration between the toes right forefoot   Plan of Care:  1. Patient was evaluated. X-rays reviewed  2.  Stressed the importance of reducing activity.  Also stressed the importance of compliance with the cam boot.  Patient states that the cam boot is very uncomfortable.  Postsurgical shoe dispensed today. 3.  Continue external bone stimulator daily 4.  Recommend Betadine between the toes daily and advised against wearing any socks or compressive type dressing that would compress the toes together 5.  RTC 4 weeks for follow-up x-ray    Felecia Shelling, DPM [facility]  Dr. Felecia Shelling, DPM    [address] [place], [place] [address]                Office [phone]  Fax [phone]

## 2022-07-26 ENCOUNTER — Ambulatory Visit (INDEPENDENT_AMBULATORY_CARE_PROVIDER_SITE_OTHER): Payer: Medicaid Other | Admitting: Podiatry

## 2022-07-26 ENCOUNTER — Encounter: Payer: Self-pay | Admitting: *Deleted

## 2022-07-26 ENCOUNTER — Telehealth: Payer: Self-pay | Admitting: Podiatry

## 2022-07-26 ENCOUNTER — Ambulatory Visit (INDEPENDENT_AMBULATORY_CARE_PROVIDER_SITE_OTHER): Payer: Medicaid Other

## 2022-07-26 VITALS — BP 147/93 | HR 170

## 2022-07-26 DIAGNOSIS — M216X1 Other acquired deformities of right foot: Secondary | ICD-10-CM | POA: Diagnosis not present

## 2022-07-26 DIAGNOSIS — Z9889 Other specified postprocedural states: Secondary | ICD-10-CM

## 2022-07-26 NOTE — Progress Notes (Signed)
   Chief Complaint  Patient presents with   Routine Post Op    POV#4 DOS 05/27/2022 4TH METATARSAL REVISIONAL OSTEOTOMY LENGTHENING WITH BONE ALLOGRAFT RT, Patient denies any N/V/F/C/SOB, patient is still having pain raste of pain 7 out of 10, X-rays done today TX: surgical shoe     Subjective:  Patient presents today status post fourth metatarsal osteotomy/lengthening with bone allograft and internal fixation. DOS: 05/27/2022.  Patient states that she is frustrated with the healing process.  She is WBAT surgical shoe.  Pain is minimal as long as she is not excessively walking on her foot.  No new complaints at this time  Past Medical History:  Diagnosis Date   Anemia    Anxiety    Bipolar depression ([place])    Depression    Difficult intubation    x 1 with foot surgery   Dizziness    Migraines    Nausea    Rectal bleeding     Past Surgical History:  Procedure Laterality Date   HARDWARE REMOVAL Left 05/14/2021   Procedure: LEFT FOOT HARDWARE REMOVAL;  Surgeon: Edrick Kins, DPM;  Location: [facility];  Service: Podiatry;  Laterality: Left;   HARDWARE REMOVAL Right 07/23/2021   Procedure: HARDWARE REMOVAL;  Surgeon: Edrick Kins, DPM;  Location: [facility];  Service: Podiatry;  Laterality: Right;   METATARSAL OSTEOTOMY Left 01/15/2021   Procedure: METATARSAL OSTEOTOMY;  Surgeon: Edrick Kins, DPM;  Location: [facility];  Service: Podiatry;  Laterality: Left;   METATARSAL OSTEOTOMY Right 05/28/2021   Procedure: METATARSAL OSTEOTOMY FOURTH TOE RIGHT FOOT;  Surgeon: Edrick Kins, DPM;  Location: [facility];  Service: Podiatry;  Laterality: Right;   METATARSAL OSTEOTOMY Right 05/27/2022   Procedure: METATARSAL OSTEOTOMY FOURTH TOE;  Surgeon: Edrick Kins, DPM;  Location: [facility];  Service: Podiatry;  Laterality: Right;   TONSILLECTOMY     age 32   WISDOM TOOTH EXTRACTION      Allergies  Allergen Reactions   Latex Rash    Objective/Physical Exam Neurovascular status intact.   Skin incisions healed.  Minimal edema noted.  Interdigital maceration resolved  Radiographic exam RT foot 07/26/2022 Mostly unchanged since prior x-rays.  Orthopedic hardware stable and intact.  Bone allograft also in good alignment.  Degenerative changes noted to the fourth MTP.    Assessment: 1. s/p fourth metatarsal osteotomy with bone allograft lengthening and internal fixation RT foot. DOS: 05/27/2022 2.  Interdigital maceration between the toes right forefoot   Plan of Care:  1. Patient was evaluated. X-rays reviewed  2.  Reassured the patient that x-rays appear stable and the healing process will take several months up to 1 year.  Patient understands and felt more at ease after our conversation today. 3.  Continue external bone stimulator daily 4.  Continue minimal WBAT surgical shoe 5.  RTC 8 weeks for follow-up x-ray    Edrick Kins, DPM [facility]  Dr. Edrick Kins, DPM    [address], [place] [address]                Office [phone]  Fax [phone]

## 2022-07-26 NOTE — Telephone Encounter (Signed)
Called pt. To speak with her about some questions she has about Disability. I left a voicemail to have her return call.

## 2022-07-27 ENCOUNTER — Encounter: Payer: Self-pay | Admitting: Podiatry

## 2022-09-27 ENCOUNTER — Inpatient Hospital Stay: Payer: Medicaid Other | Attending: Oncology | Admitting: Licensed Clinical Social Worker

## 2022-09-27 ENCOUNTER — Ambulatory Visit (INDEPENDENT_AMBULATORY_CARE_PROVIDER_SITE_OTHER): Payer: Medicaid Other

## 2022-09-27 ENCOUNTER — Encounter: Payer: Self-pay | Admitting: Licensed Clinical Social Worker

## 2022-09-27 ENCOUNTER — Encounter: Payer: Self-pay | Admitting: Podiatry

## 2022-09-27 ENCOUNTER — Inpatient Hospital Stay: Payer: Medicaid Other

## 2022-09-27 ENCOUNTER — Ambulatory Visit (INDEPENDENT_AMBULATORY_CARE_PROVIDER_SITE_OTHER): Payer: Medicaid Other | Admitting: Podiatry

## 2022-09-27 VITALS — BP 142/92 | HR 73

## 2022-09-27 DIAGNOSIS — Z801 Family history of malignant neoplasm of trachea, bronchus and lung: Secondary | ICD-10-CM

## 2022-09-27 DIAGNOSIS — Z800 Family history of malignant neoplasm of digestive organs: Secondary | ICD-10-CM

## 2022-09-27 DIAGNOSIS — Z803 Family history of malignant neoplasm of breast: Secondary | ICD-10-CM | POA: Diagnosis not present

## 2022-09-27 DIAGNOSIS — Z1501 Genetic susceptibility to malignant neoplasm of breast: Secondary | ICD-10-CM

## 2022-09-27 DIAGNOSIS — M216X1 Other acquired deformities of right foot: Secondary | ICD-10-CM

## 2022-09-27 DIAGNOSIS — Z9889 Other specified postprocedural states: Secondary | ICD-10-CM | POA: Diagnosis not present

## 2022-09-27 DIAGNOSIS — Q72891 Other reduction defects of right lower limb: Secondary | ICD-10-CM

## 2022-09-27 MED ORDER — TRAMADOL HCL 50 MG PO TABS: 50.0000 mg | ORAL_TABLET | Freq: Four times a day (QID) | ORAL | 0 refills | Status: AC | PRN

## 2022-09-27 NOTE — Progress Notes (Signed)
Chief Complaint  Patient presents with   Routine Post Op    "It's in a lot of pain and continuously swollen."    Subjective:  Patient presents today status post fourth metatarsal osteotomy/lengthening with bone allograft and internal fixation. DOS: 05/27/2022.  Patient is a send she continues to have some swelling with tenderness to the foot.  She is WBAT in the surgical shoe.  She states that she uses the bone stimulator daily.  Past Medical History:  Diagnosis Date   Anemia    Anxiety    Bipolar depression ([place])    Depression    Difficult intubation    x 1 with foot surgery   Dizziness    Migraines    Nausea    Rectal bleeding     Past Surgical History:  Procedure Laterality Date   HARDWARE REMOVAL Left 05/14/2021   Procedure: LEFT FOOT HARDWARE REMOVAL;  Surgeon: Edrick Kins, DPM;  Location: [facility];  Service: Podiatry;  Laterality: Left;   HARDWARE REMOVAL Right 07/23/2021   Procedure: HARDWARE REMOVAL;  Surgeon: Edrick Kins, DPM;  Location: [facility];  Service: Podiatry;  Laterality: Right;   METATARSAL OSTEOTOMY Left 01/15/2021   Procedure: METATARSAL OSTEOTOMY;  Surgeon: Edrick Kins, DPM;  Location: [facility];  Service: Podiatry;  Laterality: Left;   METATARSAL OSTEOTOMY Right 05/28/2021   Procedure: METATARSAL OSTEOTOMY FOURTH TOE RIGHT FOOT;  Surgeon: Edrick Kins, DPM;  Location: [facility];  Service: Podiatry;  Laterality: Right;   METATARSAL OSTEOTOMY Right 05/27/2022   Procedure: METATARSAL OSTEOTOMY FOURTH TOE;  Surgeon: Edrick Kins, DPM;  Location: [facility];  Service: Podiatry;  Laterality: Right;   TONSILLECTOMY     age 32   WISDOM TOOTH EXTRACTION      Allergies  Allergen Reactions   Latex Rash    Objective: Physical Exam General: The patient is alert and oriented x3 in no acute distress.  Dermatology: Skin is warm dry and supple bilateral.  Incision healed.  No hypertrophic keloid scar formation noted  Vascular: Palpable pedal pulses  bilaterally.  Today there is some edema localized around the surgical site.  No erythema.  Neurological: Grossly intact via light touch  Musculoskeletal Exam: There continues to be some tenderness throughout palpation along the surgical site and fourth metatarsal.   Radiographic exam RT foot 07/26/2022 Mostly unchanged since prior x-rays.  Orthopedic hardware stable and intact.  Bone allograft also in good alignment.  Degenerative changes noted to the fourth MTP.    Radiographic exam RT foot 09/27/2022 Orthopedic hardware is stable.  No significant improvement of the bone allograft.  Radiolucency noted throughout the bone allograft portion of the fourth metatarsal.  Again, degenerative changes noted to the fourth MTP  Assessment: 1. s/p fourth metatarsal osteotomy with bone allograft lengthening and internal fixation RT foot. DOS: 05/27/2022  Plan of Care:  1. Patient was evaluated. X-rays reviewed and compared to prior x-rays 2.  For now continue observation with serial x-rays 3.  Continue Exogen bone stimulator daily 4.  Continue WBAT surgical shoe 5.  Prescription for tramadol 50 mg as needed pain 6.  Stressed the importance of maintaining/continuing to refrain from smoking which directly inhibits osseous healing  7.  Return to clinic 2 months follow-up x-ray    Edrick Kins, DPM [facility]  Dr. Edrick Kins, DPM    [address]  [place], [place] [address]                Office [phone]  Fax [phone]

## 2022-09-27 NOTE — Progress Notes (Signed)
REFERRING PROVIDER: Gertie Fey, CNM [place],  [place] [id number]  PRIMARY PROVIDER:  Patient, No Pcp Per  PRIMARY REASON FOR VISIT:  1. Family history of breast cancer   2. Family history of stomach cancer   3. Family history of lung cancer      HISTORY OF PRESENT ILLNESS:   Ms. Sayson, a 32 y.o. female, was seen for a [place] cancer genetics consultation at the request of Lucrezia Europe, CNM due to a family history of breast cancer.  Ms. Renier presents to clinic today to discuss the possibility of a hereditary predisposition to cancer, genetic testing, and to further clarify her future cancer risks, as well as potential cancer risks for family members.   CANCER HISTORY:  Ms. Erber is a 32 y.o. female with no personal history of cancer.    RISK FACTORS:  Menarche was at age 25-13.  First live birth at age 83.  Ovaries intact: yes.  Hysterectomy: no.  Menopausal status: premenopausal.  Colonoscopy: no; not examined. Mammogram within the last year: no. Number of breast biopsies: 0. Up to date with pelvic exams: yes.  Past Medical History:  Diagnosis Date   Anemia    Anxiety    Bipolar depression ([place])    Depression    Difficult intubation    x 1 with foot surgery   Dizziness    Migraines    Nausea    Rectal bleeding     Past Surgical History:  Procedure Laterality Date   HARDWARE REMOVAL Left 05/14/2021   Procedure: LEFT FOOT HARDWARE REMOVAL;  Surgeon: Edrick Kins, DPM;  Location: [facility];  Service: Podiatry;  Laterality: Left;   HARDWARE REMOVAL Right 07/23/2021   Procedure: HARDWARE REMOVAL;  Surgeon: Edrick Kins, DPM;  Location: [facility];  Service: Podiatry;  Laterality: Right;   METATARSAL OSTEOTOMY Left 01/15/2021   Procedure: METATARSAL OSTEOTOMY;  Surgeon: Edrick Kins, DPM;  Location: [facility];  Service: Podiatry;  Laterality: Left;   METATARSAL OSTEOTOMY Right 05/28/2021   Procedure: METATARSAL OSTEOTOMY FOURTH  TOE RIGHT FOOT;  Surgeon: Edrick Kins, DPM;  Location: [facility];  Service: Podiatry;  Laterality: Right;   METATARSAL OSTEOTOMY Right 05/27/2022   Procedure: METATARSAL OSTEOTOMY FOURTH TOE;  Surgeon: Edrick Kins, DPM;  Location: [facility];  Service: Podiatry;  Laterality: Right;   TONSILLECTOMY     age 46   WISDOM TOOTH EXTRACTION      FAMILY HISTORY:  We obtained a detailed, 4-generation family history.  Significant diagnoses are listed below: Family History  Problem Relation Age of Onset   Migraines Father    Heart disease Maternal Grandmother    Depression Maternal Grandmother    Hypertension Maternal Grandmother    Breast cancer Paternal Grandfather 39   Breast cancer Other        then bone cancer/metastatic   Ms. Hinkle has 3 daughters, 67, 69 and 25. She has 3 maternal half sisters, 1 paternal half brother, 1 paternal half sister, no cancers.  Ms. Gilvin mother passed at 73 of pneumonia. Patient has 1 maternal great aunt who had breast cancer and then bone cancer later in life, likely metastatic breast.  Ms. Kirchner father is living at 94 and is incarcerated. His father, patient's grandfather, died of breast cancer at 13. His mother, patient's great grandmother, died of stomach cancer. Patient's grandmother was recently diagnosed with lung cancer in her 79s.   Ms. Conneely is unaware of previous family  history of genetic testing for hereditary cancer risks. There is no reported Ashkenazi Jewish ancestry. There is no known consanguinity.    GENETIC COUNSELING ASSESSMENT: Ms. Swoveland is a 32 y.o. female with a family history of  female breast cancer which is somewhat suggestive of a hereditary cancer syndrome and predisposition to cancer. We, therefore, discussed and recommended the following at today's visit.   DISCUSSION: We discussed that approximately 10% of breast cancer is hereditary. Most cases of hereditary breast cancer are associated with BRCA1/BRCA2 genes, although  there are other genes associated with hereditary cancer as well. Cancers and risks are gene specific. We discussed that testing is beneficial for several reasons including knowing about cancer risks, identifying potential screening and risk-reduction options that may be appropriate, and to understand if other family members could be at risk for cancer and allow them to undergo genetic testing.   We reviewed the characteristics, features and inheritance patterns of hereditary cancer syndromes. We also discussed genetic testing, including the appropriate family members to test, the process of testing, insurance coverage and turn-around-time for results. We discussed the implications of a negative, positive and/or variant of uncertain significant result. We recommended Ms. Woodhams pursue genetic testing for the [facility] Multi-Cancer+RNA gene panel.   Based on Ms. Mcveigh's family history of cancer, she meets medical criteria for genetic testing. Despite that she meets criteria, she may still have an out of pocket cost. We discussed that if her out of pocket cost for testing is over $100, the laboratory will call and confirm whether she wants to proceed with testing.  If the out of pocket cost of testing is less than $100 she will be billed by the genetic testing laboratory.   PLAN: After considering the risks, benefits, and limitations, Ms. Stanko provided informed consent to pursue genetic testing and the blood sample was sent to [facility] for analysis of the Multi-Cancer+RNA panel. Results should be available within approximately 2-3 weeks' time, at which point they will be disclosed by telephone to Ms. Bomba, as will any additional recommendations warranted by these results. Ms. Barrientez will receive a summary of her genetic counseling visit and a copy of her results once available. This information will also be available in Epic.   Ms. Kingma questions were answered to her satisfaction today. Our  contact information was provided should additional questions or concerns arise. Thank you for the referral and allowing [place] to share in the care of your patient.   Faith Rogue, MS, [facility] Genetic Counselor [email or web address] Phone: [phone]  The patient was seen for a total of 25 minutes in face-to-face genetic counseling.  Dr. Grayland Ormond was available for discussion regarding this case.   _______________________________________________________________________ For Office Staff:  Number of people involved in session: 1 Was an Intern/ student involved with case: no

## 2022-10-05 ENCOUNTER — Telehealth: Payer: Self-pay | Admitting: Licensed Clinical Social Worker

## 2022-10-05 ENCOUNTER — Ambulatory Visit: Payer: Self-pay | Admitting: Licensed Clinical Social Worker

## 2022-10-05 ENCOUNTER — Encounter: Payer: Self-pay | Admitting: Licensed Clinical Social Worker

## 2022-10-05 DIAGNOSIS — Z1379 Encounter for other screening for genetic and chromosomal anomalies: Secondary | ICD-10-CM

## 2022-10-05 NOTE — Progress Notes (Signed)
HPI:   Maria Espinoza was previously seen in the [place] clinic due to a family history of breast cancer and concerns regarding a hereditary predisposition to cancer. Please refer to our prior cancer genetics clinic note for more information regarding our discussion, assessment and recommendations, at the time. Maria Espinoza recent genetic test results were disclosed to her, as were recommendations warranted by these results. These results and recommendations are discussed in more detail below.  CANCER HISTORY:  Oncology History   No history exists.    FAMILY HISTORY:  We obtained a detailed, 4-generation family history.  Significant diagnoses are listed below: Family History  Problem Relation Age of Onset   Migraines Father    Heart disease Maternal Grandmother    Depression Maternal Grandmother    Hypertension Maternal Grandmother    Breast cancer Paternal Grandfather 69   Breast cancer Other        then bone cancer/metastatic   Maria Espinoza has 3 daughters, 82, 47 and 66. She has 3 maternal half sisters, 1 paternal half brother, 1 paternal half sister, no cancers.   Maria Espinoza mother passed at 55 of pneumonia. Patient has 1 maternal great aunt who had breast cancer and then bone cancer later in life, likely metastatic breast.   Maria Espinoza father is living at 31 and is incarcerated. His father, patient's grandfather, died of breast cancer at 52. His mother, patient's great grandmother, died of stomach cancer. Patient's grandmother was recently diagnosed with lung cancer in her 58s.    Maria Espinoza is unaware of previous family history of genetic testing for hereditary cancer risks. There is no reported Ashkenazi Jewish ancestry. There is no known consanguinity.      GENETIC TEST RESULTS:  The [facility] Multi-Cancer+RNA Panel found no pathogenic mutations.   The Multi-Cancer + RNA Panel offered by [facility] includes sequencing and/or deletion/duplication analysis of the  following 70 genes:  AIP*, ALK, APC*, ATM*, AXIN2*, BAP1*, BARD1*, BLM*, BMPR1A*, BRCA1*, BRCA2*, BRIP1*, CDC73*, CDH1*, CDK4, CDKN1B*, CDKN2A, CHEK2*, CTNNA1*, DICER1*, EPCAM, EGFR, FH*, FLCN*, GREM1, HOXB13, KIT, LZTR1, MAX*, MBD4, MEN1*, MET, MITF, MLH1*, MSH2*, MSH3*, MSH6*, MUTYH*, NF1*, NF2*, NTHL1*, PALB2*, PDGFRA, PMS2*, POLD1*, POLE*, POT1*, PRKAR1A*, PTCH1*, PTEN*, RAD51C*, RAD51D*, RB1*, RET, SDHA*, SDHAF2*, SDHB*, SDHC*, SDHD*, SMAD4*, SMARCA4*, SMARCB1*, SMARCE1*, STK11*, SUFU*, TMEM127*, TP53*, TSC1*, TSC2*, VHL*. RNA analysis is performed for * genes.   The test report has been scanned into EPIC and is located under the Molecular Pathology section of the Results Review tab.  A portion of the result report is included below for reference. Genetic testing reported out on 10/05/2022.     Even though a pathogenic variant was not identified, possible explanations for the cancer in the family may include: There may be no hereditary risk for cancer in the family. The cancers in Maria Espinoza and/or her family may be sporadic/familial or due to other genetic and environmental factors. There may be a gene mutation in one of these genes that current testing methods cannot detect but that chance is small. There could be another gene that has not yet been discovered, or that we have not yet tested, that is responsible for the cancer diagnoses in the family.  It is also possible there is a hereditary cause for the cancer in the family that Maria Espinoza did not inherit. Therefore, it is important to remain in touch with cancer genetics in the future so that we can continue to offer Maria Espinoza the most up to  date genetic testing.    ADDITIONAL GENETIC TESTING:  We discussed with Maria Espinoza that her genetic testing was fairly extensive.  If there are additional relevant genes identified to increase cancer risk that can be analyzed in the future, we would be happy to discuss and coordinate this testing at  that time.    CANCER SCREENING RECOMMENDATIONS:  Maria Espinoza test result is considered negative (normal).  This means that we have not identified a hereditary cause for her family history of cancer at this time.   An individual's cancer risk and medical management are not determined by genetic test results alone. Overall cancer risk assessment incorporates additional factors, including personal medical history, family history, and any available genetic information that may result in a personalized plan for cancer prevention and surveillance. Therefore, it is recommended she continue to follow the cancer management and screening guidelines provided by her  primary healthcare provider.  RECOMMENDATIONS FOR FAMILY MEMBERS:   Since she did not inherit a identifiable mutation in a cancer predisposition gene included on this panel, her children could not have inherited a known mutation from her in one of these genes. Individuals in this family might be at some increased risk of developing cancer, over the general population risk, due to the family history of cancer.  Individuals in the family should notify their providers of the family history of cancer. We recommend women in this family have a yearly mammogram beginning at age 56, or 29 years younger than the earliest onset of cancer, an annual clinical breast exam, and perform monthly breast self-exams.  Family members should have colonoscopies by at age 39, or earlier, as recommended by their providers. Other members of the family may still carry a pathogenic variant in one of these genes that Ms. Viti did not inherit. Based on the family history, we recommend her siblings/paternal relatives have genetic counseling and testing. Ms. Aquirre will let us know if we can be of any assistance in coordinating genetic counseling and/or testing for this family member.    FOLLOW-UP:  Lastly, we discussed with Ms. Culbreath that cancer genetics is a rapidly advancing  field and it is possible that new genetic tests will be appropriate for her and/or her family members in the future. We encouraged her to remain in contact with cancer genetics on an annual basis so we can update her personal and family histories and let her know of advances in cancer genetics that may benefit this family.   Our contact number was provided. Ms. Clyburn questions were answered to her satisfaction, and she knows she is welcome to call us at anytime with additional questions or concerns.    Faith Rogue, MS, [facility] Genetic Counselor [email or web address] Phone: [phone]

## 2022-10-05 NOTE — Telephone Encounter (Signed)
I contacted Maria Espinoza to discuss her genetic testing results. No pathogenic variants were identified in the 70 genes analyzed. Detailed clinic note to follow.   The test report has been scanned into EPIC and is located under the Molecular Pathology section of the Results Review tab.  A portion of the result report is included below for reference.      Faith Rogue, MS, [facility] Genetic Counselor [email or web address] Phone: [phone]

## 2022-10-19 ENCOUNTER — Other Ambulatory Visit: Payer: Self-pay

## 2022-10-19 ENCOUNTER — Emergency Department: Payer: Medicaid Other

## 2022-10-19 ENCOUNTER — Emergency Department
Admission: EM | Admit: 2022-10-19 | Discharge: 2022-10-19 | Disposition: A | Discharge status: Home / Self Care | Payer: Medicaid Other | Attending: Emergency Medicine | Admitting: Emergency Medicine

## 2022-10-19 ENCOUNTER — Encounter: Payer: Self-pay | Admitting: *Deleted

## 2022-10-19 DIAGNOSIS — R55 Syncope and collapse: Secondary | ICD-10-CM | POA: Diagnosis not present

## 2022-10-19 DIAGNOSIS — R7989 Other specified abnormal findings of blood chemistry: Secondary | ICD-10-CM | POA: Diagnosis not present

## 2022-10-19 LAB — BASIC METABOLIC PANEL
Anion gap: 8 (ref 5–15)
BUN: 13 mg/dL (ref 6–20)
CO2: 24 mmol/L (ref 22–32)
Calcium: 8.5 mg/dL — ABNORMAL LOW (ref 8.9–10.3)
Chloride: 104 mmol/L (ref 98–111)
Creatinine, Ser: 0.96 mg/dL (ref 0.44–1.00)
GFR, Estimated: >60 mL/min (ref >60)
Glucose, Bld: 120 mg/dL — ABNORMAL HIGH (ref 70–99)
Potassium: 3.7 mmol/L (ref 3.5–5.1)
Sodium: 136 mmol/L (ref 135–145)

## 2022-10-19 LAB — D-DIMER, QUANTITATIVE: D-Dimer, Quant: 0.95 ug/mL-FEU — ABNORMAL HIGH (ref 0.00–0.50)

## 2022-10-19 LAB — TROPONIN I (HIGH SENSITIVITY)
Troponin I (High Sensitivity): 3 ng/L (ref <18)
Troponin I (High Sensitivity): 3 ng/L (ref <18)

## 2022-10-19 LAB — CBC
HCT: 38.7 % (ref 36.0–46.0)
Hemoglobin: 12.6 g/dL (ref 12.0–15.0)
MCH: 28.5 pg (ref 26.0–34.0)
MCHC: 32.6 g/dL (ref 30.0–36.0)
MCV: 87.6 fL (ref 80.0–100.0)
Platelets: 375 10*3/uL (ref 150–400)
RBC: 4.42 MIL/uL (ref 3.87–5.11)
RDW: 14.3 % (ref 11.5–15.5)
WBC: 10.3 10*3/uL (ref 4.0–10.5)
nRBC: 0 % (ref 0.0–0.2)

## 2022-10-19 LAB — POC URINE PREG, ED: Preg Test, Ur: NEGATIVE

## 2022-10-19 LAB — MAGNESIUM: Magnesium: 2 mg/dL (ref 1.7–2.4)

## 2022-10-19 MED ORDER — SODIUM CHLORIDE 0.9 % IV BOLUS (SEPSIS)
1000.0000 mL | Freq: Once | INTRAVENOUS | Status: AC
  Administered 2022-10-19: 1000 mL via INTRAVENOUS

## 2022-10-19 MED ORDER — IOHEXOL 350 MG/ML SOLN
75.0000 mL | Freq: Once | INTRAVENOUS | Status: AC | PRN
  Administered 2022-10-19: 75 mL via INTRAVENOUS

## 2022-10-19 NOTE — ED Notes (Signed)
Poct pregnancy Negative 

## 2022-10-19 NOTE — ED Notes (Signed)
Ambulated Pt in hall with steady gait. Pt stated she felt like she was swaying but did not feel like she would pass out, information given to provider.

## 2022-10-19 NOTE — ED Triage Notes (Signed)
Pt reports fainting spells x 3 in 2 days.  Pt hit head on cement.  Pt also reports neck pain.  Pt states she is tired.  No chest pain or sob.  No n/v/d  pt alert speech clear.

## 2022-10-19 NOTE — ED Provider Notes (Signed)
[facility] Provider Note    Event Date/Time   First MD Initiated Contact with Patient 10/19/22 0154     (approximate)   History   Loss of Consciousness   HPI  Maria Espinoza is a 32 y.o. female history of bipolar disorder, migraines who presents to the emergency department with complaints of syncope.  Reports she had a syncopal event about a week ago [place].  States that she got lightheaded and her vision became them and she passed out.  She did hit her head at that time.  She was not worried about it initially because she states she has had syncopal events when she was younger and this was an isolated event.  She states today she had another syncopal event while in the gas station.  She states she went to pay for her gas and while standing at the register she again felt like her vision was becoming them and she felt lightheaded and had some shortness of breath and passed out.  She did not fall to the ground.  She states she sat down in a chair for about 10 minutes and then felt better and got up and walked to her car and then passed out again on the way to her car.  She denies any preceding chest pain or palpitations.  No recent vomiting, diarrhea.  No fevers.  No calf tenderness or calf swelling.  No history of PE or DVT.  No recent prolonged immobilization or exogenous estrogen use.  States she is having headache and posterior neck pain.  No numbness, tingling or weakness.   History provided by patient.    Past Medical History:  Diagnosis Date   Anemia    Anxiety    Bipolar depression    Depression    Difficult intubation    x 1 with foot surgery   Dizziness    Migraines    Nausea    Rectal bleeding     Past Surgical History:  Procedure Laterality Date   HARDWARE REMOVAL Left 05/14/2021   Procedure: LEFT FOOT HARDWARE REMOVAL;  Surgeon: Edrick Kins, DPM;  Location: [facility];  Service: Podiatry;  Laterality:  Left;   HARDWARE REMOVAL Right 07/23/2021   Procedure: HARDWARE REMOVAL;  Surgeon: Edrick Kins, DPM;  Location: [facility];  Service: Podiatry;  Laterality: Right;   METATARSAL OSTEOTOMY Left 01/15/2021   Procedure: METATARSAL OSTEOTOMY;  Surgeon: Edrick Kins, DPM;  Location: [facility];  Service: Podiatry;  Laterality: Left;   METATARSAL OSTEOTOMY Right 05/28/2021   Procedure: METATARSAL OSTEOTOMY FOURTH TOE RIGHT FOOT;  Surgeon: Edrick Kins, DPM;  Location: [facility];  Service: Podiatry;  Laterality: Right;   METATARSAL OSTEOTOMY Right 05/27/2022   Procedure: METATARSAL OSTEOTOMY FOURTH TOE;  Surgeon: Edrick Kins, DPM;  Location: [facility];  Service: Podiatry;  Laterality: Right;   TONSILLECTOMY     age 20   WISDOM TOOTH EXTRACTION      MEDICATIONS:  Prior to Admission medications   Medication Sig Start Date End Date Taking? Authorizing Provider  clotrimazole-betamethasone (LOTRISONE) cream Apply 1 Application topically 2 (two) times daily. 01/21/22   Edrick Kins, DPM  sertraline (ZOLOFT) 100 MG tablet Take 100 mg by mouth daily.    [provider]  terbinafine (LAMISIL) 250 MG tablet Take 1 tablet (250 mg total) by mouth daily. Patient not taking: Reported on 09/27/2022 05/09/22   Edrick Kins, DPM  traZODone (  DESYREL) 150 MG tablet Take 150 mg by mouth at bedtime as needed for sleep.    [provider]  Vitamin D, Ergocalciferol, (DRISDOL) 1.25 MG (50000 UNIT) CAPS capsule Take 1 capsule (50,000 Units total) by mouth every 7 (seven) days. 01/22/22   Edrick Kins, DPM  VRAYLAR 3 MG capsule Take 3 mg by mouth at bedtime. 05/12/21   [provider]    Physical Exam   Triage Vital Signs: ED Triage Vitals  Enc Vitals Group     BP 10/19/22 0026 124/84     Pulse Rate 10/19/22 0026 99     Resp 10/19/22 0026 18     Temp 10/19/22 0026 98.8 F (37.1 C)     Temp Source 10/19/22 0026 Oral     SpO2 10/19/22 0026 97 %     Weight 10/19/22 0024 280 lb (127  kg)     Height 10/19/22 0024 5\' 3"  (1.6 m)     Head Circumference --      Peak Flow --      Pain Score 10/19/22 0024 8     Pain Loc --      Pain Edu? --      Excl. in [place]? --     Most recent vital signs: Vitals:   10/19/22 0300 10/19/22 0601  BP: 129/85 121/84  Pulse: 75 66  Resp: 18 18  Temp:    SpO2: 100% 99%    CONSTITUTIONAL: Alert, responds appropriately to questions. Well-appearing; well-nourished HEAD: Normocephalic, atraumatic EYES: Conjunctivae clear, pupils appear equal, sclera nonicteric ENT: normal nose; moist mucous membranes NECK: Supple, normal ROM CARD: RRR; S1 and S2 appreciated RESP: Normal chest excursion without splinting or tachypnea; breath sounds clear and equal bilaterally; no wheezes, no rhonchi, no rales, no hypoxia or respiratory distress, speaking full sentences ABD/GI: Non-distended; soft, non-tender, no rebound, no guarding, no peritoneal signs BACK: The back appears normal EXT: Normal ROM in all joints; no deformity noted, no edema SKIN: Normal color for age and race; warm; no rash on exposed skin NEURO: Moves all extremities equally, normal speech, no facial asymmetry, normal sensation PSYCH: The patient's mood and manner are appropriate.   ED Results / Procedures / Treatments   LABS: (all labs ordered are listed, but only abnormal results are displayed) Labs Reviewed  BASIC METABOLIC PANEL - Abnormal; Notable for the following components:      Result Value   Glucose, Bld 120 (*)    Calcium 8.5 (*)    All other components within normal limits  D-DIMER, QUANTITATIVE (NOT AT [facility]) - Abnormal; Notable for the following components:   D-Dimer, Quant 0.95 (*)    All other components within normal limits  CBC  MAGNESIUM  POC URINE PREG, ED  TROPONIN I (HIGH SENSITIVITY)  TROPONIN I (HIGH SENSITIVITY)     EKG:  EKG Interpretation  Date/Time:  Wednesday October 19 2022 00:27:35 EDT Ventricular Rate:  98 PR Interval:  190 QRS  Duration: 84 QT Interval:  362 QTC Calculation: 462 R Axis:   89 Text Interpretation: Normal sinus rhythm Cannot rule out Anterior infarct , age undetermined Abnormal ECG When compared with ECG of 03-Oct-2018 16:48, Questionable change in QRS axis Confirmed by Pryor Curia [phone]) on 10/19/2022 6:50:29 AM         RADIOLOGY: My personal review and interpretation of imaging: CT head and cervical spine show no traumatic injury.  CTA of the chest shows no large PE but poor contrast bolus.  I  have personally reviewed all radiology reports.   CT Angio Chest PE W and/or Wo Contrast  Result Date: 10/19/2022 CLINICAL DATA:  32 year old female with recurrent fainting over the past 2 days. Neck pain, fatigue. Abnormal D-dimer. EXAM: CT ANGIOGRAPHY CHEST WITH CONTRAST TECHNIQUE: Multidetector CT imaging of the chest was performed using the standard protocol during bolus administration of intravenous contrast. Multiplanar CT image reconstructions and MIPs were obtained to evaluate the vascular anatomy. RADIATION DOSE REDUCTION: This exam was performed according to the departmental dose-optimization program which includes automated exposure control, adjustment of the mA and/or kV according to patient size and/or use of iterative reconstruction technique. CONTRAST:  62mL OMNIPAQUE IOHEXOL 350 MG/ML SOLN COMPARISON:  Portable chest 0044 hours today. FINDINGS: Cardiovascular: Suboptimal contrast bolus timing in the pulmonary arterial tree. Adequate opacification of the central and lobar pulmonary arteries which are enhancing normally, appear patent. Proximal segmental arteries off the pulmonary hila also appear enhancing and patent. But subsegmental and distal branches are not well evaluated. Cardiac size is at the upper limits of normal. No pericardial effusion. Negative visible aorta. No calcified coronary artery plaque is evident. Mediastinum/Nodes: Negative. No mediastinal mass or lymphadenopathy. Lungs/Pleura:  Major airways are patent. Mildly low lung volumes. Mild and fairly symmetric mosaic pulmonary attenuation which is probably a combination of mild gas trapping and atelectasis. Lungs otherwise negative. No pleural effusion. Upper Abdomen: Negative visible liver, gallbladder, spleen, pancreas, adrenal glands, kidneys, and bowel in the upper abdomen. Musculoskeletal: Some thoracic degenerative endplate spurring. No acute osseous abnormality identified. Review of the MIP images confirms the above findings. IMPRESSION: 1. Suboptimal contrast bolus timing. No pulmonary embolus is identified. 2. Minor lung atelectasis.  No other acute finding in the chest. Electronically Signed   By: Genevie Ann M.D.   On: 10/19/2022 05:03   CT HEAD WO CONTRAST (5MM)  Result Date: 10/19/2022 CLINICAL DATA:  Patient reports fainting spells 3 times in the last 2 days. Patient hit head on cement. Neck pain EXAM: CT HEAD WITHOUT CONTRAST CT CERVICAL SPINE WITHOUT CONTRAST TECHNIQUE: Multidetector CT imaging of the head and cervical spine was performed following the standard protocol without intravenous contrast. Multiplanar CT image reconstructions of the cervical spine were also generated. RADIATION DOSE REDUCTION: This exam was performed according to the departmental dose-optimization program which includes automated exposure control, adjustment of the mA and/or kV according to patient size and/or use of iterative reconstruction technique. COMPARISON:  None Available. FINDINGS: CT HEAD FINDINGS Brain: No intracranial hemorrhage, mass effect, or evidence of acute infarct. No hydrocephalus. No extra-axial fluid collection. Vascular: No hyperdense vessel or unexpected calcification. Skull: No fracture or focal lesion. Sinuses/Orbits: No acute finding. Paranasal sinuses and mastoid air cells are well aerated. Other: None. CT CERVICAL SPINE FINDINGS Alignment: No evidence of traumatic malalignment. Reversal of lordosis is likely  chronic/positional. Skull base and vertebrae: No acute fracture. No primary bone lesion or focal pathologic process. 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 Soft tissues and spinal canal: No prevertebral fluid or swelling. No visible canal hematoma. Disc levels: No significant spondylosis. No spinal canal or neural foraminal narrowing. Upper chest: Negative. Other: None. IMPRESSION: No acute intracranial abnormality. No cervical spine fracture. Electronically Signed   By: Placido Sou M.D.   On: 10/19/2022 01:32   CT Cervical Spine Wo Contrast  Result Date: 10/19/2022 CLINICAL DATA:  Patient reports fainting spells 3 times in the last 2 days. Patient hit head on cement. Neck pain EXAM: CT HEAD  WITHOUT CONTRAST CT CERVICAL SPINE WITHOUT CONTRAST TECHNIQUE: Multidetector CT imaging of the head and cervical spine was performed following the standard protocol without intravenous contrast. Multiplanar CT image reconstructions of the cervical spine were also generated. RADIATION DOSE REDUCTION: This exam was performed according to the departmental dose-optimization program which includes automated exposure control, adjustment of the mA and/or kV according to patient size and/or use of iterative reconstruction technique. COMPARISON:  None Available. FINDINGS: CT HEAD FINDINGS Brain: No intracranial hemorrhage, mass effect, or evidence of acute infarct. No hydrocephalus. No extra-axial fluid collection. Vascular: No hyperdense vessel or unexpected calcification. Skull: No fracture or focal lesion. Sinuses/Orbits: No acute finding. Paranasal sinuses and mastoid air cells are well aerated. Other: None. CT CERVICAL SPINE FINDINGS Alignment: No evidence of traumatic malalignment. Reversal of lordosis is likely chronic/positional. Skull base and vertebrae: No acute fracture. No primary bone lesion or focal pathologic process. 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 Soft  tissues and spinal canal: No prevertebral fluid or swelling. No visible canal hematoma. Disc levels: No significant spondylosis. No spinal canal or neural foraminal narrowing. Upper chest: Negative. Other: None. IMPRESSION: No acute intracranial abnormality. No cervical spine fracture. Electronically Signed   By: Placido Sou M.D.   On: 10/19/2022 01:32   DG Chest 1 View  Result Date: 10/19/2022 CLINICAL DATA:  Syncope EXAM: CHEST  1 VIEW COMPARISON:  10/01/2018 FINDINGS: The heart size and mediastinal contours are within normal limits. Both lungs are clear. The visualized skeletal structures are unremarkable. IMPRESSION: No active disease. Electronically Signed   By: Fidela Salisbury M.D.   On: 10/19/2022 00:53     PROCEDURES:  Critical Care performed: No      .1-3 Lead EKG Interpretation  Performed by: Tyshan Enderle, Delice Bison, DO Authorized by: Grabiel Schmutz, Delice Bison, DO     Interpretation: normal     ECG rate:  66   ECG rate assessment: normal     Rhythm: sinus rhythm     Ectopy: none     Conduction: normal       IMPRESSION / MDM / ASSESSMENT AND PLAN / ED COURSE  I reviewed the triage vital signs and the nursing notes.    Patient here with 3 syncopal events in the past week.  The patient is on the cardiac monitor to evaluate for evidence of arrhythmia and/or significant heart rate changes.   DIFFERENTIAL DIAGNOSIS (includes but not limited to):   Vasovagal syncope, orthostasis, anemia, electrolyte derangement, arrhythmia, less likely ACS, PE, dissection   Patient's presentation is most consistent with acute presentation with potential threat to life or bodily function.   PLAN: EKG shows no ischemic abnormality, delta wave, [place], LVH, interval abnormality.  Will obtain labs, CT head and cervical spine, keep on cardiac monitoring.  Will check orthostatic vital signs and give IV fluids.   MEDICATIONS GIVEN IN ED: Medications  sodium chloride 0.9 % bolus 1,000 mL (0 mLs  Intravenous Stopped 10/19/22 0517)  iohexol (OMNIPAQUE) 350 MG/ML injection 75 mL (75 mLs Intravenous Contrast Given 10/19/22 0444)     ED COURSE: Patient is not orthostatic here.  CT head and cervical spine reviewed and interpreted by myself and radiologist and show no acute traumatic injury.  She is neurologically intact here.  Normal hemoglobin.  Normal electrolytes.  Normal glucose.  No evidence seen on cardiac monitoring.  Pregnancy test is negative.  Troponin x 2 is negative.  D-dimer elevated.  Will proceed with CTA of the  chest.   CTA of the chest reviewed and interpreted by myself and the radiologist and shows no pulmonary embolus but there was suboptimal contrast bolus timing.  Patient is technically PERC negative and therefore I do not feel we need to keep her for VQ scan or ultrasounds of her legs.  She has had some intermittent tachypnea initially but this has resolved and she has no tachycardia, tachypnea or hypoxia now.  Low suspicion for clinically significant PE if 1 is present.  I have recommended following up with cardiology as an outpatient as it is unclear what is causing her episodes of syncope as they do not sound vasovagal and are not clearly orthostatic.  Recommended increase fluid intake and return to the ED if any symptoms return.  It also does not sound like a seizure.  There is no postictal state, tongue biting or witnessed seizure-like activity.  I feel she is safe to be discharged and she is also comfortable with this plan.  I have placed a referral for cardiology as an outpatient for close outpatient follow-up for possible Holter monitoring, echocardiogram.   At this time, I do not feel there is any life-threatening condition present. I reviewed all nursing notes, vitals, pertinent previous records.  All lab and urine results, EKGs, imaging ordered have been independently reviewed and interpreted by myself.  I reviewed all available radiology reports from any imaging ordered  this visit.  Based on my assessment, I feel the patient is safe to be discharged home without further emergent workup and can continue workup as an outpatient as needed. Discussed all findings, treatment plan as well as usual and customary return precautions.  They verbalize understanding and are comfortable with this plan.  Outpatient follow-up has been provided as needed.  All questions have been answered.    CONSULTS:  none   OUTSIDE RECORDS REVIEWED: Reviewed last OB/GYN note on 10/06/2022.       FINAL CLINICAL IMPRESSION(S) / ED DIAGNOSES   Final diagnoses:  Syncope and collapse     Rx / DC Orders   ED Discharge Orders          Ordered    Ambulatory referral to Cardiology        10/19/22 0509             Note:  This document was prepared using Dragon voice recognition software and may include unintentional dictation errors.   Maria Espinoza, Delice Bison, DO 10/19/22 [phone]

## 2022-11-10 ENCOUNTER — Ambulatory Visit: Payer: Medicaid Other | Admitting: Cardiology

## 2022-11-14 ENCOUNTER — Other Ambulatory Visit: Payer: Self-pay

## 2022-11-29 ENCOUNTER — Ambulatory Visit: Payer: Medicaid Other | Admitting: Podiatry

## 2022-12-09 ENCOUNTER — Ambulatory Visit (INDEPENDENT_AMBULATORY_CARE_PROVIDER_SITE_OTHER): Payer: Medicaid Other | Admitting: Podiatry

## 2022-12-09 DIAGNOSIS — Q72891 Other reduction defects of right lower limb: Secondary | ICD-10-CM | POA: Diagnosis not present

## 2022-12-09 DIAGNOSIS — Q666 Other congenital valgus deformities of feet: Secondary | ICD-10-CM

## 2022-12-09 NOTE — Progress Notes (Signed)
Chief Complaint  Patient presents with   Routine Post Op    Right foot follow-up. Patient is having some pain and swelling, throbbing, rate of pain 6 out of 10, X-Rays done today     Subjective:  Patient presents today status post fourth metatarsal osteotomy/lengthening with bone allograft and internal fixation. DOS: 05/27/2022.  Patient now has a job where she works from home.  She says the pain is tolerable.  She does get swelling throughout the day depending on her activity.  She also continues to use the external bone stimulator daily.  Presenting for further treatment and evaluation  Past Medical History:  Diagnosis Date   Anemia    Anxiety    Bipolar depression (HCC)    Depression    Difficult intubation    x 1 with foot surgery   Dizziness    Migraines    Nausea    Rectal bleeding    Syncope and collapse     Past Surgical History:  Procedure Laterality Date   HARDWARE REMOVAL Left 05/14/2021   Procedure: LEFT FOOT HARDWARE REMOVAL;  Surgeon: Felecia Shelling, DPM;  Location: [facility];  Service: Podiatry;  Laterality: Left;   HARDWARE REMOVAL Right 07/23/2021   Procedure: HARDWARE REMOVAL;  Surgeon: Felecia Shelling, DPM;  Location: [facility];  Service: Podiatry;  Laterality: Right;   METATARSAL OSTEOTOMY Left 01/15/2021   Procedure: METATARSAL OSTEOTOMY;  Surgeon: Felecia Shelling, DPM;  Location: [facility];  Service: Podiatry;  Laterality: Left;   METATARSAL OSTEOTOMY Right 05/28/2021   Procedure: METATARSAL OSTEOTOMY FOURTH TOE RIGHT FOOT;  Surgeon: Felecia Shelling, DPM;  Location: [facility];  Service: Podiatry;  Laterality: Right;   METATARSAL OSTEOTOMY Right 05/27/2022   Procedure: METATARSAL OSTEOTOMY FOURTH TOE;  Surgeon: Felecia Shelling, DPM;  Location: [facility];  Service: Podiatry;  Laterality: Right;   TONSILLECTOMY     age 32   WISDOM TOOTH EXTRACTION      Allergies  Allergen Reactions   Latex Rash    Objective: Physical Exam General: The patient is alert and  oriented x3 in no acute distress.  Dermatology: Skin is warm dry and supple bilateral.  Incision healed.  No hypertrophic keloid scar formation noted  Vascular: Palpable pedal pulses bilaterally.  Today there is some edema localized around the surgical site.  No erythema.  Neurological: Grossly intact via light touch  Musculoskeletal Exam: There continues to be some tenderness throughout palpation along the surgical site and fourth metatarsal.   Radiographic exam RT foot 07/26/2022 Mostly unchanged since prior x-rays.  Orthopedic hardware stable and intact.  Bone allograft also in good alignment.  Degenerative changes noted to the fourth MTP.    Radiographic exam RT foot 12/09/2022 Essentially unchanged.  Orthopedic hardware is stable.  No significant improvement of the bone allograft.  Radiolucency noted throughout the bone allograft portion of the fourth metatarsal.  Again, degenerative changes noted to the fourth MTP  Assessment: 1. s/p fourth metatarsal osteotomy with bone allograft lengthening and internal fixation RT foot. DOS: 05/27/2022 2.  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 Patient was evaluated. X-rays reviewed and compared to prior x-rays 2.  For now continue observation with serial x-rays 3.  Continue Exogen bone stimulator daily 4.  Continue WBAT surgical shoe 5.  Prescription for tramadol 50 mg as needed pain 6.  If there continues to be radiolucency and we have allowed this nail to heal  for a year we will consider possible revision and removal of the hardware with resection of the portion of AVN and nonunion with application of calcaneal autograft.  We will discuss this in greater detail towards the end of the year. 7.  Return to clinic 6 months follow-up x-ray and possible surgical consult   Felecia Shelling, DPM [facility]  Dr. Felecia Shelling, DPM    [address] [place], [place] [address]                Office [phone]  Fax [phone]

## 2022-12-16 ENCOUNTER — Ambulatory Visit: Payer: Medicaid Other | Admitting: Internal Medicine

## 2023-01-30 ENCOUNTER — Ambulatory Visit: Payer: 59 | Admitting: Cardiology

## 2023-02-28 ENCOUNTER — Other Ambulatory Visit: Payer: Self-pay

## 2023-02-28 ENCOUNTER — Emergency Department
Admission: EM | Admit: 2023-02-28 | Discharge: 2023-03-01 | Disposition: A | Discharge status: Home / Self Care | Payer: 59 | Attending: Emergency Medicine | Admitting: Emergency Medicine

## 2023-02-28 DIAGNOSIS — Z3202 Encounter for pregnancy test, result negative: Secondary | ICD-10-CM | POA: Diagnosis not present

## 2023-02-28 LAB — HCG, QUANTITATIVE, PREGNANCY: hCG, Beta Chain, Quant, S: <1 m[IU]/mL (ref <5)

## 2023-02-28 NOTE — ED Provider Notes (Signed)
   [facility] Provider Note    Event Date/Time   First MD Initiated Contact with Patient 02/28/23 2257     (approximate)   History   requesting labs    HPI  Zariya Limberg is a 32 y.o. female who presents to the ED for evaluation of requesting labs    Patient presents to the ED requesting a serum pregnancy test "because of my history."   Physical Exam   Triage Vital Signs: ED Triage Vitals [02/28/23 2233]  Encounter Vitals Group     BP (!) 150/98     Systolic BP Percentile      Diastolic BP Percentile      Pulse Rate 93     Resp 18     Temp 98.3 F (36.8 C)     Temp Source Oral     SpO2 99 %     Weight 290 lb (131.5 kg)     Height 5\' 3"  (1.6 m)     Head Circumference      Peak Flow      Pain Score 0     Pain Loc      Pain Education      Exclude from Growth Chart     Most recent vital signs: Vitals:   02/28/23 2233  BP: (!) 150/98  Pulse: 93  Resp: 18  Temp: 98.3 F (36.8 C)  SpO2: 99%    General: Awake, no distress.  Morbidly obese and well-appearing CV:  Good peripheral perfusion.  Resp:  Normal effort.  Abd:  No distention.  MSK:  No deformity noted.  Neuro:  No focal deficits appreciated. Other:     ED Results / Procedures / Treatments   Labs (all labs ordered are listed, but only abnormal results are displayed) Labs Reviewed  HCG, QUANTITATIVE, PREGNANCY    EKG   RADIOLOGY   Official radiology report(s): No results found.  PROCEDURES and INTERVENTIONS:  Procedures  Medications - No data to display   IMPRESSION / MDM / ASSESSMENT AND PLAN / ED COURSE  I reviewed the triage vital signs and the nursing notes.  Differential diagnosis includes, but is not limited to, pregnant, not pregnant, ectopic  Patient presents for pregnancy test, which was negative.  No other complaints or concerns      FINAL CLINICAL IMPRESSION(S) / ED DIAGNOSES   Final diagnoses:  Pregnancy examination or test, negative  result     Rx / DC Orders   ED Discharge Orders     None        Note:  This document was prepared using Dragon voice recognition software and may include unintentional dictation errors.   Delton [place]irie, MD 02/28/23 [phone]

## 2023-02-28 NOTE — ED Triage Notes (Signed)
Pt reports concern for possible pregnancy with several negative home tests. Pt requesting a blood Hcg test. Pt reports she is 13 days late for her menstrual cycle. Pt also reports concern for possible miscarriage but denies any symptoms that would indicate that to her based off prior experience. Pt alert and oriented following commands. Breathing unlabored speaking in full sentences.

## 2023-04-07 ENCOUNTER — Ambulatory Visit: Payer: 59 | Admitting: Cardiology

## 2023-04-22 DIAGNOSIS — F3132 Bipolar disorder, current episode depressed, moderate: Secondary | ICD-10-CM | POA: Diagnosis not present

## 2023-05-09 ENCOUNTER — Ambulatory Visit (INDEPENDENT_AMBULATORY_CARE_PROVIDER_SITE_OTHER): Payer: 59 | Admitting: Podiatry

## 2023-05-09 ENCOUNTER — Ambulatory Visit: Payer: 59 | Admitting: Podiatry

## 2023-05-09 DIAGNOSIS — Z91199 Patient's noncompliance with other medical treatment and regimen due to unspecified reason: Secondary | ICD-10-CM

## 2023-05-09 NOTE — Progress Notes (Signed)
   Complete physical exam  Patient: Maria Espinoza   DOB: 05/07/1999   32 y.o. Female  MRN: [id number]  Subjective:    No chief complaint on file.   Maria Espinoza is a 32 y.o. female who presents today for a complete physical exam. She reports consuming a {diet types:17450} diet. {types:19826} She generally feels {DESC; WELL/FAIRLY WELL/POORLY:18703}. She reports sleeping {DESC; WELL/FAIRLY WELL/POORLY:18703}. She {does/does not:200015} have additional problems to discuss today.    Most recent fall risk assessment:    01/12/2022   10:42 AM  Fall Risk   Falls in the past year? 0  Number falls in past yr: 0  Injury with Fall? 0  Risk for fall due to : No Fall Risks  Follow up Falls evaluation completed     Most recent depression screenings:    01/12/2022   10:42 AM 12/03/2020   10:46 AM  PHQ 2/9 Scores  PHQ - 2 Score 0 0  PHQ- 9 Score 5     {VISON DENTAL STD PSA (Optional):27386}  {History (Optional):23778}  Patient Care Team: Jessup, Joy, NP as PCP - General (Nurse Practitioner)   Outpatient Medications Prior to Visit  Medication Sig   fluticasone (FLONASE) 50 MCG/ACT nasal spray Place 2 sprays into both nostrils in the morning and at bedtime. After 7 days, reduce to once daily.   norgestimate-ethinyl estradiol (SPRINTEC 28) 0.25-35 MG-MCG tablet Take 1 tablet by mouth daily.   Nystatin POWD Apply liberally to affected area 2 times per day   spironolactone (ALDACTONE) 100 MG tablet Take 1 tablet (100 mg total) by mouth daily.   No facility-administered medications prior to visit.    ROS        Objective:     There were no vitals taken for this visit. {Vitals History (Optional):23777}  Physical Exam   No results found for any visits on 02/17/22. {Show previous labs (optional):23779}    Assessment & Plan:    Routine Health Maintenance and Physical Exam  Immunization History  Administered Date(s) Administered   DTaP 07/21/1999, 09/16/1999,  11/25/1999, 08/10/2000, 02/24/2004   Hepatitis A 12/21/2007, 12/26/2008   Hepatitis B 05/08/1999, 06/15/1999, 11/25/1999   HiB (PRP-OMP) 07/21/1999, 09/16/1999, 11/25/1999, 08/10/2000   IPV 07/21/1999, 09/16/1999, 05/15/2000, 02/24/2004   Influenza,inj,Quad PF,6+ Mos 03/28/2014   Influenza-Unspecified 06/27/2012   MMR 05/15/2001, 02/24/2004   Meningococcal Polysaccharide 12/26/2011   Pneumococcal Conjugate-13 08/10/2000   Pneumococcal-Unspecified 11/25/1999, 02/08/2000   Tdap 12/26/2011   Varicella 05/15/2000, 12/21/2007    Health Maintenance  Topic Date Due   HIV Screening  Never done   Hepatitis C Screening  Never done   INFLUENZA VACCINE  02/15/2022   PAP-Cervical Cytology Screening  02/17/2022 (Originally 05/06/2020)   PAP SMEAR-Modifier  02/17/2022 (Originally 05/06/2020)   TETANUS/TDAP  02/17/2022 (Originally 12/25/2021)   HPV VACCINES  Discontinued   COVID-19 Vaccine  Discontinued    Discussed health benefits of physical activity, and encouraged her to engage in regular exercise appropriate for her age and condition.  Problem List Items Addressed This Visit   None Visit Diagnoses     Annual physical exam    -  Primary   Cervical cancer screening       Need for Tdap vaccination          No follow-ups on file.     Joy Jessup, NP   

## 2023-05-16 DIAGNOSIS — N979 Female infertility, unspecified: Secondary | ICD-10-CM | POA: Diagnosis not present

## 2023-05-16 DIAGNOSIS — R87810 Cervical high risk human papillomavirus (HPV) DNA test positive: Secondary | ICD-10-CM | POA: Diagnosis not present

## 2023-05-16 DIAGNOSIS — Z113 Encounter for screening for infections with a predominantly sexual mode of transmission: Secondary | ICD-10-CM | POA: Diagnosis not present

## 2023-05-16 DIAGNOSIS — N839 Noninflammatory disorder of ovary, fallopian tube and broad ligament, unspecified: Secondary | ICD-10-CM | POA: Diagnosis not present

## 2023-05-24 DIAGNOSIS — N979 Female infertility, unspecified: Secondary | ICD-10-CM | POA: Diagnosis not present

## 2023-05-24 DIAGNOSIS — N839 Noninflammatory disorder of ovary, fallopian tube and broad ligament, unspecified: Secondary | ICD-10-CM | POA: Diagnosis not present

## 2023-06-05 ENCOUNTER — Encounter: Payer: Self-pay | Admitting: Cardiology

## 2023-06-05 ENCOUNTER — Telehealth: Payer: Self-pay | Admitting: Pharmacy Technician

## 2023-06-05 ENCOUNTER — Ambulatory Visit: Payer: 59 | Attending: Cardiology | Admitting: Cardiology

## 2023-06-05 ENCOUNTER — Other Ambulatory Visit (HOSPITAL_COMMUNITY): Payer: Self-pay

## 2023-06-05 ENCOUNTER — Ambulatory Visit (INDEPENDENT_AMBULATORY_CARE_PROVIDER_SITE_OTHER): Payer: 59

## 2023-06-05 ENCOUNTER — Telehealth: Payer: Self-pay | Admitting: Cardiology

## 2023-06-05 VITALS — BP 124/80 | HR 82 | Ht 63.0 in | Wt 318.8 lb

## 2023-06-05 DIAGNOSIS — F17200 Nicotine dependence, unspecified, uncomplicated: Secondary | ICD-10-CM

## 2023-06-05 DIAGNOSIS — R55 Syncope and collapse: Secondary | ICD-10-CM

## 2023-06-05 MED ORDER — SEMAGLUTIDE-WEIGHT MANAGEMENT 0.5 MG/0.5ML ~~LOC~~ SOAJ: 0.5000 mg | SUBCUTANEOUS | 0 refills | Status: AC

## 2023-06-05 MED ORDER — SEMAGLUTIDE-WEIGHT MANAGEMENT 2.4 MG/0.75ML ~~LOC~~ SOAJ: 2.4000 mg | SUBCUTANEOUS | 3 refills | Status: DC

## 2023-06-05 MED ORDER — SEMAGLUTIDE-WEIGHT MANAGEMENT 1 MG/0.5ML ~~LOC~~ SOAJ: 1.0000 mg | SUBCUTANEOUS | 0 refills | Status: AC

## 2023-06-05 MED ORDER — SEMAGLUTIDE-WEIGHT MANAGEMENT 0.25 MG/0.5ML ~~LOC~~ SOAJ: 0.2500 mg | SUBCUTANEOUS | 0 refills | Status: AC

## 2023-06-05 MED ORDER — SEMAGLUTIDE-WEIGHT MANAGEMENT 1.7 MG/0.75ML ~~LOC~~ SOAJ: 1.7000 mg | SUBCUTANEOUS | 0 refills | Status: DC

## 2023-06-05 NOTE — Telephone Encounter (Signed)
Patient is needing a work note, asking if we can send to her in [place].    Pt c/o medication issue:  1. Name of Medication: Semaglutide-Weight Management 0.25 MG/0.5ML SOAJ   2. How are you currently taking this medication (dosage and times per day)?    3. Are you having a reaction (difficulty breathing--STAT)? no  4. What is your medication issue? States the medication is not cover under her insurance. Please advise

## 2023-06-05 NOTE — Telephone Encounter (Signed)
Pharmacy Patient Advocate Encounter   Received notification from Pt Calls Messages that prior authorization for wegovy is required/requested.   Insurance verification completed.   The patient is insured through [employer] .   Per test claim: PA required; PA submitted to above mentioned insurance via CoverMyMeds Key/confirmation #/EOC [id number] Status is pending

## 2023-06-05 NOTE — Progress Notes (Signed)
Cardiology Office Note:    Date:  06/05/2023   ID:  Maria Espinoza, DOB 09-10-1990, MRN [id number]  PCP:  Corky Downs, MD   [facility] Providers Cardiologist:  Debbe Odea, MD     Referring MD: Ward, Layla Maw, DO   Chief Complaint  Patient presents with   New Patient (Initial Visit)    Referred by ED provider on 10/19/22 following visit for syncope episode.  Patient reports continued syncope episodes about twice a month with last episode 2 weeks ago.  Orthostatic bp obtained today.   Maria Espinoza is a 32 y.o. female who is being seen today for the evaluation of syncope at the request of Ward, Layla Maw, DO.   History of Present Illness:    Maria Espinoza is a 32 y.o. female with a hx of anxiety, bipolar disorder, current smoker x 4 years, morbid obesity who presents due to syncope.  Has been dealing with episodic syncope over the past 10 years.  Seems to have worsened of late, becoming more frequent.  Last episodes was 2 weeks ago when patient was doing her hair while standing.  She suddenly felt flushed, dizzy, blurred vision prior to passing out.  Similar episode occurred 2 months ago while she was at a gas station.  She denies chest pain or shortness of breath, denies palpitations, denies any history of heart disease.  Typically has warning symptoms before passing out.  Past Medical History:  Diagnosis Date   Anemia    Anxiety    Bipolar depression (HCC)    Depression    Difficult intubation    x 1 with foot surgery   Dizziness    Migraines    Nausea    Rectal bleeding    Syncope and collapse     Past Surgical History:  Procedure Laterality Date   HARDWARE REMOVAL Left 05/14/2021   Procedure: LEFT FOOT HARDWARE REMOVAL;  Surgeon: Felecia Shelling, DPM;  Location: [facility];  Service: Podiatry;  Laterality: Left;   HARDWARE REMOVAL Right 07/23/2021   Procedure: HARDWARE REMOVAL;  Surgeon: Felecia Shelling, DPM;  Location: [facility];  Service: Podiatry;   Laterality: Right;   METATARSAL OSTEOTOMY Left 01/15/2021   Procedure: METATARSAL OSTEOTOMY;  Surgeon: Felecia Shelling, DPM;  Location: [facility];  Service: Podiatry;  Laterality: Left;   METATARSAL OSTEOTOMY Right 05/28/2021   Procedure: METATARSAL OSTEOTOMY FOURTH TOE RIGHT FOOT;  Surgeon: Felecia Shelling, DPM;  Location: [facility];  Service: Podiatry;  Laterality: Right;   METATARSAL OSTEOTOMY Right 05/27/2022   Procedure: METATARSAL OSTEOTOMY FOURTH TOE;  Surgeon: Felecia Shelling, DPM;  Location: [facility];  Service: Podiatry;  Laterality: Right;   TONSILLECTOMY     age 32   WISDOM TOOTH EXTRACTION      Current Medications: Current Meds  Medication Sig   clotrimazole-betamethasone (LOTRISONE) cream Apply 1 Application topically 2 (two) times daily.   Multiple Vitamin (MULTIVITAMIN) tablet Take 1 tablet by mouth daily.   Semaglutide-Weight Management 0.25 MG/0.5ML SOAJ Inject 0.25 mg into the skin once a week for 28 days.   [START ON 07/04/2023] Semaglutide-Weight Management 0.5 MG/0.5ML SOAJ Inject 0.5 mg into the skin once a week for 28 days.   [START ON 08/02/2023] Semaglutide-Weight Management 1 MG/0.5ML SOAJ Inject 1 mg into the skin once a week for 28 days.   [START ON 08/31/2023] Semaglutide-Weight Management 1.7 MG/0.75ML SOAJ Inject 1.7 mg into the skin once a week for 28 days.   [START ON 09/29/2023]  Semaglutide-Weight Management 2.4 MG/0.75ML SOAJ Inject 2.4 mg into the skin once a week.   sertraline (ZOLOFT) 100 MG tablet Take 100 mg by mouth daily.   traZODone (DESYREL) 150 MG tablet Take 150 mg by mouth at bedtime as needed for sleep.   VRAYLAR 3 MG capsule Take 3 mg by mouth at bedtime.     Allergies:   Latex   Social History   Socioeconomic History   Marital status: Single    Spouse name: Not on file   Number of children: 3   Years of education: Not on file   Highest education level: Not on file  Occupational History   Not on file  Tobacco Use   Smoking status: Some  Days    Current packs/day: 0.25    Average packs/day: 0.3 packs/day for 3.0 years (0.8 ttl pk-yrs)    Types: Cigarettes    Passive exposure: Current   Smokeless tobacco: Never  Vaping Use   Vaping status: Never Used  Substance and Sexual Activity   Alcohol use: Yes    Comment: occasional   Drug use: Never   Sexual activity: Not Currently    Partners: Male    Birth control/protection: Abstinence  Other Topics Concern   Not on file  Social History Narrative   Not on file   Social Determinants of Health   Financial Resource Strain: Not on file  Food Insecurity: Not on file  Transportation Needs: Not on file  Physical Activity: Not on file  Stress: Not on file  Social Connections: Not on file     Family History: The patient's family history includes Breast cancer in an other family member; Breast cancer (age of onset: 58) in her paternal grandfather; Depression in her maternal grandmother; Heart disease in her maternal grandmother; Hypertension in her maternal grandmother; Migraines in her father.  ROS:   Please see the history of present illness.     All other systems reviewed and are negative.  EKGs/Labs/Other Studies Reviewed:    The following studies were reviewed today:  EKG Interpretation Date/Time:  Monday June 05 2023 11:17:02 EST Ventricular Rate:  82 PR Interval:  168 QRS Duration:  80 QT Interval:  380 QTC Calculation: 443 R Axis:   31  Text Interpretation: Normal sinus rhythm Normal ECG Confirmed by Debbe Odea ([id number]) on 06/05/2023 11:29:17 AM    Recent Labs: 10/19/2022: BUN 13; Creatinine, Ser 0.96; Hemoglobin 12.6; Magnesium 2.0; Platelets 375; Potassium 3.7; Sodium 136  Recent Lipid Panel    Component Value Date/Time   CHOL 134 10/03/2018 0443   TRIG 63 10/03/2018 0443   HDL 38 (L) 10/03/2018 0443   CHOLHDL 3.5 10/03/2018 0443   VLDL 13 10/03/2018 0443   LDLCALC 83 10/03/2018 0443     Risk Assessment/Calculations:             Physical Exam:    VS:  BP 124/80 (BP Location: Left Arm, Patient Position: Sitting, Cuff Size: Large)   Pulse 82   Ht 5\' 3"  (1.6 m)   Wt (!) 318 lb 12.8 oz (144.6 kg)   SpO2 98%   BMI 56.47 kg/m     Wt Readings from Last 3 Encounters:  06/05/23 (!) 318 lb 12.8 oz (144.6 kg)  02/28/23 290 lb (131.5 kg)  10/19/22 280 lb (127 kg)     GEN:  Well nourished, well developed in no acute distress HEENT: Normal NECK: No JVD; No carotid bruits CARDIAC: RRR, no murmurs, rubs, gallops RESPIRATORY:  Clear to auscultation without rales, wheezing or rhonchi  ABDOMEN: Soft, non-tender, non-distended MUSCULOSKELETAL:  No edema; No deformity  SKIN: Warm and dry NEUROLOGIC:  Alert and oriented x 3 PSYCHIATRIC:  Normal affect   ASSESSMENT:    1. Syncope and collapse   2. Smoking   3. Morbid obesity (HCC)    PLAN:    In order of problems listed above:  Syncope, etiology appears vasovagal with clear prodromal symptoms prior to passing out.  Denies any cardiac symptoms of chest pain, palpitations.  Orthostatic vitals today with no evidence for orthostasis.  Rule out cardiac etiology with echo and cardiac monitor.  Safety precautions when prodromal symptoms occur advised. Current smoker, smoking cessation advised. Morbid obesity, low-calorie diet, weight loss advised.  Start [employer].  Follow-up after cardiac testing.      Medication Adjustments/Labs and Tests Ordered: Current medicines are reviewed at length with the patient today.  Concerns regarding medicines are outlined above.  Orders Placed This Encounter  Procedures   LONG TERM MONITOR (3-14 DAYS)   EKG 12-Lead   ECHOCARDIOGRAM COMPLETE   Meds ordered this encounter  Medications   Semaglutide-Weight Management 0.25 MG/0.5ML SOAJ    Sig: Inject 0.25 mg into the skin once a week for 28 days.    Dispense:  2 mL    Refill:  0   Semaglutide-Weight Management 0.5 MG/0.5ML SOAJ    Sig: Inject 0.5 mg into the skin once a week for  28 days.    Dispense:  2 mL    Refill:  0   Semaglutide-Weight Management 1 MG/0.5ML SOAJ    Sig: Inject 1 mg into the skin once a week for 28 days.    Dispense:  2 mL    Refill:  0   Semaglutide-Weight Management 1.7 MG/0.75ML SOAJ    Sig: Inject 1.7 mg into the skin once a week for 28 days.    Dispense:  3 mL    Refill:  0   Semaglutide-Weight Management 2.4 MG/0.75ML SOAJ    Sig: Inject 2.4 mg into the skin once a week.    Dispense:  3 mL    Refill:  3    Patient Instructions  Medication Instructions:   Start taking Wegovy  Month 1: 0.25 mg once a week.  Month 2: 0.5 mg once a week. (Call or send [place] a MyChart message when you are on week 2, so we can send your next dose in for you).  Month 3: 1 mg once a week.  Month 4: 1.7 mg once a week.  Month 5 and beyond: 2.4 mg once a week (maintenance dose)  *If you need a refill on your cardiac medications before your next appointment, please call your pharmacy*   Lab Work:  None Ordered  If you have labs (blood work) drawn today and your tests are completely normal, you will receive your results only by: MyChart Message (if you have MyChart) OR A paper copy in the mail If you have any lab test that is abnormal or we need to change your treatment, we will call you to review the results.   Testing/Procedures:  Your physician has requested that you have an echocardiogram. Echocardiography is a painless test that uses sound waves to create images of your heart. It provides your doctor with information about the size and shape of your heart and how well your heart's chambers and valves are working. This procedure takes approximately one hour. There are no restrictions for this  procedure. Please do NOT wear cologne, perfume, aftershave, or lotions (deodorant is allowed). Please arrive 15 minutes prior to your appointment time.  Please note: We ask at that you not bring children with you during ultrasound (echo/ vascular)  testing. Due to room size and safety concerns, children are not allowed in the ultrasound rooms during exams. Our front office staff cannot provide observation of children in our lobby area while testing is being conducted. An adult accompanying a patient to their appointment will only be allowed in the ultrasound room at the discretion of the ultrasound technician under special circumstances. We apologize for any inconvenience.  Your physician has recommended that you wear a Zio monitor.   This monitor is a medical device that records the heart's electrical activity. Doctors most often use these monitors to diagnose arrhythmias. Arrhythmias are problems with the speed or rhythm of the heartbeat. The monitor is a small device applied to your chest. You can wear one while you do your normal daily activities. While wearing this monitor if you have any symptoms to push the button and record what you felt. Once you have worn this monitor for the period of time provider prescribed (Usually 14 days), you will return the monitor device in the postage paid box. Once it is returned they will download the data collected and provide [place] with a report which the provider will then review and we will call you with those results. Important tips:  Avoid showering during the first 24 hours of wearing the monitor. Avoid excessive sweating to help maximize wear time. Do not submerge the device, no hot tubs, and no swimming pools. Keep any lotions or oils away from the patch. After 24 hours you may shower with the patch on. Take brief showers with your back facing the shower head.  Do not remove patch once it has been placed because that will interrupt data and decrease adhesive wear time. Push the button when you have any symptoms and write down what you were feeling. Once you have completed wearing your monitor, remove and place into box which has postage paid and place in your outgoing mailbox.  If for some reason you  have misplaced your box then call our office and we can provide another box and/or mail it off for you.   Follow-Up: At [facility], you and your health needs are our priority.  As part of our continuing mission to provide you with exceptional heart care, we have created designated Provider Care Teams.  These Care Teams include your primary Cardiologist (physician) and Advanced Practice Providers (APPs -  Physician Assistants and Nurse Practitioners) who all work together to provide you with the care you need, when you need it.  We recommend signing up for the patient portal called "MyChart".  Sign up information is provided on this After Visit Summary.  MyChart is used to connect with patients for Virtual Visits (Telemedicine).  Patients are able to view lab/test results, encounter notes, upcoming appointments, etc.  Non-urgent messages can be sent to your provider as well.   To learn more about what you can do with MyChart, go to [email or web address].    Your next appointment:    8- 10 weeks  Provider:   You may see Debbe Odea, MD or one of the following Advanced Practice Providers on your designated Care Team:   Nicolasa Ducking, NP Eula Listen, PA-C Cadence Fransico Michael, PA-C Charlsie Quest, NP Carlos Levering, NP   Signed, Debbe Odea, MD  06/05/2023 12:56 PM    [facility]

## 2023-06-05 NOTE — Telephone Encounter (Signed)
Can you please review and see if they are meaning a PA is needed.

## 2023-06-05 NOTE — Patient Instructions (Signed)
Medication Instructions:   Start taking Wegovy  Month 1: 0.25 mg once a week.  Month 2: 0.5 mg once a week. (Call or send [place] a MyChart message when you are on week 2, so we can send your next dose in for you).  Month 3: 1 mg once a week.  Month 4: 1.7 mg once a week.  Month 5 and beyond: 2.4 mg once a week (maintenance dose)  *If you need a refill on your cardiac medications before your next appointment, please call your pharmacy*   Lab Work:  None Ordered  If you have labs (blood work) drawn today and your tests are completely normal, you will receive your results only by: MyChart Message (if you have MyChart) OR A paper copy in the mail If you have any lab test that is abnormal or we need to change your treatment, we will call you to review the results.   Testing/Procedures:  Your physician has requested that you have an echocardiogram. Echocardiography is a painless test that uses sound waves to create images of your heart. It provides your doctor with information about the size and shape of your heart and how well your heart's chambers and valves are working. This procedure takes approximately one hour. There are no restrictions for this procedure. Please do NOT wear cologne, perfume, aftershave, or lotions (deodorant is allowed). Please arrive 15 minutes prior to your appointment time.  Please note: We ask at that you not bring children with you during ultrasound (echo/ vascular) testing. Due to room size and safety concerns, children are not allowed in the ultrasound rooms during exams. Our front office staff cannot provide observation of children in our lobby area while testing is being conducted. An adult accompanying a patient to their appointment will only be allowed in the ultrasound room at the discretion of the ultrasound technician under special circumstances. We apologize for any inconvenience.  Your physician has recommended that you wear a Zio monitor.   This  monitor is a medical device that records the heart's electrical activity. Doctors most often use these monitors to diagnose arrhythmias. Arrhythmias are problems with the speed or rhythm of the heartbeat. The monitor is a small device applied to your chest. You can wear one while you do your normal daily activities. While wearing this monitor if you have any symptoms to push the button and record what you felt. Once you have worn this monitor for the period of time provider prescribed (Usually 14 days), you will return the monitor device in the postage paid box. Once it is returned they will download the data collected and provide [place] with a report which the provider will then review and we will call you with those results. Important tips:  Avoid showering during the first 24 hours of wearing the monitor. Avoid excessive sweating to help maximize wear time. Do not submerge the device, no hot tubs, and no swimming pools. Keep any lotions or oils away from the patch. After 24 hours you may shower with the patch on. Take brief showers with your back facing the shower head.  Do not remove patch once it has been placed because that will interrupt data and decrease adhesive wear time. Push the button when you have any symptoms and write down what you were feeling. Once you have completed wearing your monitor, remove and place into box which has postage paid and place in your outgoing mailbox.  If for some reason you have misplaced your box  then call our office and we can provide another box and/or mail it off for you.   Follow-Up: At [facility], you and your health needs are our priority.  As part of our continuing mission to provide you with exceptional heart care, we have created designated Provider Care Teams.  These Care Teams include your primary Cardiologist (physician) and Advanced Practice Providers (APPs -  Physician Assistants and Nurse Practitioners) who all work together to provide you  with the care you need, when you need it.  We recommend signing up for the patient portal called "MyChart".  Sign up information is provided on this After Visit Summary.  MyChart is used to connect with patients for Virtual Visits (Telemedicine).  Patients are able to view lab/test results, encounter notes, upcoming appointments, etc.  Non-urgent messages can be sent to your provider as well.   To learn more about what you can do with MyChart, go to [email or web address].    Your next appointment:    8- 10 weeks  Provider:   You may see Debbe Odea, MD or one of the following Advanced Practice Providers on your designated Care Team:   Nicolasa Ducking, NP Eula Listen, PA-C Cadence Fransico Michael, PA-C Charlsie Quest, NP Carlos Levering, NP

## 2023-06-06 ENCOUNTER — Other Ambulatory Visit (HOSPITAL_COMMUNITY): Payer: Self-pay

## 2023-06-06 NOTE — Telephone Encounter (Signed)
Pharmacy Patient Advocate Encounter  Received notification from [facility] that Prior Authorization for wegovy has been APPROVED from 06/06/23 to 12/04/22. Ran test claim, Copay is $4.00- one month. This test claim was processed through [facility]- copay amounts may vary at other pharmacies due to pharmacy/plan contracts, or as the patient moves through the different stages of their insurance plan.   PA #/Case ID/Reference #: [id number]

## 2023-06-06 NOTE — Telephone Encounter (Signed)
Called patient and made her aware.

## 2023-06-08 DIAGNOSIS — R55 Syncope and collapse: Secondary | ICD-10-CM

## 2023-06-09 ENCOUNTER — Ambulatory Visit: Payer: 59 | Admitting: Podiatry

## 2023-06-21 ENCOUNTER — Ambulatory Visit: Payer: 59 | Admitting: Cardiology

## 2023-06-27 ENCOUNTER — Ambulatory Visit: Payer: 59 | Attending: Cardiology

## 2023-06-27 DIAGNOSIS — R55 Syncope and collapse: Secondary | ICD-10-CM

## 2023-06-27 LAB — ECHOCARDIOGRAM COMPLETE
AR max vel: 2.83 cm2
AV Area VTI: 2.71 cm2
AV Area mean vel: 2.59 cm2
AV Mean grad: 6 mm[Hg]
AV Peak grad: 10.9 mm[Hg]
Ao pk vel: 1.65 m/s
Area-P 1/2: 3.72 cm2
Calc EF: 51.3 %
S' Lateral: 3.5 cm
Single Plane A2C EF: 53.4 %
Single Plane A4C EF: 53.1 %

## 2023-06-29 DIAGNOSIS — R55 Syncope and collapse: Secondary | ICD-10-CM | POA: Diagnosis not present

## 2023-08-02 ENCOUNTER — Encounter: Payer: Self-pay | Admitting: Nurse Practitioner

## 2023-08-02 ENCOUNTER — Ambulatory Visit: Payer: 59 | Attending: Nurse Practitioner | Admitting: Nurse Practitioner

## 2023-08-02 VITALS — BP 130/78 | HR 80 | Ht 63.0 in | Wt 313.8 lb

## 2023-08-02 DIAGNOSIS — R55 Syncope and collapse: Secondary | ICD-10-CM | POA: Diagnosis not present

## 2023-08-02 DIAGNOSIS — I471 Supraventricular tachycardia, unspecified: Secondary | ICD-10-CM

## 2023-08-02 DIAGNOSIS — G4730 Sleep apnea, unspecified: Secondary | ICD-10-CM | POA: Diagnosis not present

## 2023-08-02 DIAGNOSIS — F17200 Nicotine dependence, unspecified, uncomplicated: Secondary | ICD-10-CM

## 2023-08-02 MED ORDER — METOPROLOL SUCCINATE ER 25 MG PO TB24: 25.0000 mg | ORAL_TABLET | Freq: Every day | ORAL | 1 refills | Status: DC

## 2023-08-02 NOTE — Patient Instructions (Signed)
 Medication Instructions:  START Metoprolol  (Toprol ) 25 mg once daily *If you need a refill on your cardiac medications before your next appointment, please call your pharmacy*   Lab Work: Your provider would like for you to have the following labs today: CBC, BMET, Magnesium , TSH  If you have labs (blood work) drawn today and your tests are completely normal, you will receive your results only by: MyChart Message (if you have MyChart) OR A paper copy in the mail If you have any lab test that is abnormal or we need to change your treatment, we will call you to review the results.   Testing/Procedures: None ordered   Follow-Up: At [facility], you and your health needs are our priority.  As part of our continuing mission to provide you with exceptional heart care, we have created designated Provider Care Teams.  These Care Teams include your primary Cardiologist (physician) and Advanced Practice Providers (APPs -  Physician Assistants and Nurse Practitioners) who all work together to provide you with the care you need, when you need it.  We recommend signing up for the patient portal called "MyChart".  Sign up information is provided on this After Visit Summary.  MyChart is used to connect with patients for Virtual Visits (Telemedicine).  Patients are able to view lab/test results, encounter notes, upcoming appointments, etc.  Non-urgent messages can be sent to your provider as well.   To learn more about what you can do with MyChart, go to [email or web address].    Your next appointment:   3 month(s)  Provider:   You may see Constancia Delton, MD or one of the following Advanced Practice Providers on your designated Care Team:   Laneta Pintos, NP  Other Instructions A referral has been placed to Pulmonology

## 2023-08-02 NOTE — Progress Notes (Signed)
 Office Visit    Patient Name: Maria Espinoza Date of Encounter: 08/02/2023  Primary Care Provider:  Theron Flavin, MD Primary Cardiologist:  Constancia Delton, MD  Chief Complaint    33 y.o. female w/ a h/o anxiety, bipolar d/o, tob abuse, obesity, and syncope, who presents for follow-up related to syncope and SVT noted on monitoring.  Past Medical History  Subjective   Past Medical History:  Diagnosis Date   Anemia    Anxiety    Bipolar depression (HCC)    Depression    Difficult intubation    x 1 with foot surgery   Dizziness    Migraines    Nausea    PSVT (paroxysmal supraventricular tachycardia) (HCC)    a.05/2023 Zio: Predominant sinus rhythm (40-190, average 78). 1 run SVT x 78m 21s, max rate 190-->assoc w/ Ss.   Rectal bleeding    Syncope and collapse    a. 06/2023 Echo: EF 60-65%, no rwma, nl RV fxn, RVSP 32.5 mmHg. Mild MR, mild-mod TR; b.05/2023 Zio: Predominant sinus rhythm (40-190, average 78). 1 run SVT x 32m 21s, max rate 190-->assoc w/ Ss.   Past Surgical History:  Procedure Laterality Date   HARDWARE REMOVAL Left 05/14/2021   Procedure: LEFT FOOT HARDWARE REMOVAL;  Surgeon: Dot Gazella, DPM;  Location: [facility];  Service: Podiatry;  Laterality: Left;   HARDWARE REMOVAL Right 07/23/2021   Procedure: HARDWARE REMOVAL;  Surgeon: Dot Gazella, DPM;  Location: [facility];  Service: Podiatry;  Laterality: Right;   METATARSAL OSTEOTOMY Left 01/15/2021   Procedure: METATARSAL OSTEOTOMY;  Surgeon: Dot Gazella, DPM;  Location: [facility];  Service: Podiatry;  Laterality: Left;   METATARSAL OSTEOTOMY Right 05/28/2021   Procedure: METATARSAL OSTEOTOMY FOURTH TOE RIGHT FOOT;  Surgeon: Dot Gazella, DPM;  Location: [facility];  Service: Podiatry;  Laterality: Right;   METATARSAL OSTEOTOMY Right 05/27/2022   Procedure: METATARSAL OSTEOTOMY FOURTH TOE;  Surgeon: Dot Gazella, DPM;  Location: [facility];  Service: Podiatry;  Laterality: Right;   TONSILLECTOMY     age  24   WISDOM TOOTH EXTRACTION      Allergies  Allergies  Allergen Reactions   Latex Rash      History of Present Illness      33 y.o. y/o female with the above past medical history including anxiety, bipolar disorder, tobacco abuse, obesity, syncope, and PSVT.  She was previously evaluated in November 2024 with complaints of syncope.  She notes that presyncopal and syncopal spells have been occurring since age 22 and sometimes they are associated with palpitations.  They occur almost exclusively while standing or on a few occasions, while having a bowel movement.  Each episode is preceded by blurred vision, nausea, and flushed feeling which might last 1 to 2 minutes prior to becoming syncopal if she is unable to lie down.  Subsequent echo showed normal LV function with mildly elevated RVSP and mild-moderate TR.  2-week ZIO monitoring was notable for one 1 minute and 21-second run of SVT at a rate of 190 bpm.  This was associated with a triggered event which patient says was similar to prior presyncopal and syncopal events.   Since her last visit in November, with the exception of what occurred while wearing the monitor, where she became presyncopal associated with tachypalpitations and triggered the monitor, she has not had any presyncope or syncope.  She denies chest pain, dyspnea, palpitations, PND, orthopnea, edema, or early satiety.  She continues to smoke  5 to 7 cigarettes a day and says it helps relieve stress.  She has only been smoking for 4 years.  She recognizes the need to quit but is not sure that she is ready. Objective  Home Medications    Current Outpatient Medications  Medication Sig Dispense Refill   clotrimazole -betamethasone  (LOTRISONE ) cream Apply 1 Application topically 2 (two) times daily. 45 g 1   metoprolol  succinate (TOPROL -XL) 25 MG 24 hr tablet Take 1 tablet (25 mg total) by mouth daily. 90 tablet 1   Multiple Vitamin (MULTIVITAMIN) tablet Take 1 tablet by mouth  daily.     Semaglutide -Weight Management 1 MG/0.5ML SOAJ Inject 1 mg into the skin once a week for 28 days. 2 mL 0   sertraline  (ZOLOFT ) 100 MG tablet Take 100 mg by mouth daily.     traZODone  (DESYREL ) 150 MG tablet Take 150 mg by mouth at bedtime as needed for sleep.     VRAYLAR 3 MG capsule Take 3 mg by mouth at bedtime.     [START ON 08/31/2023] Semaglutide -Weight Management 1.7 MG/0.75ML SOAJ Inject 1.7 mg into the skin once a week for 28 days. (Patient not taking: Reported on 08/02/2023) 3 mL 0   [START ON 09/29/2023] Semaglutide -Weight Management 2.4 MG/0.75ML SOAJ Inject 2.4 mg into the skin once a week. (Patient not taking: Reported on 08/02/2023) 3 mL 3   terbinafine  (LAMISIL ) 250 MG tablet Take 1 tablet (250 mg total) by mouth daily. (Patient not taking: Reported on 08/02/2023) 30 tablet 0   Vitamin D , Ergocalciferol , (DRISDOL ) 1.25 MG (50000 UNIT) CAPS capsule Take 1 capsule (50,000 Units total) by mouth every 7 (seven) days. (Patient not taking: Reported on 08/02/2023) 12 capsule 1   No current facility-administered medications for this visit.     Physical Exam    VS:  BP 130/78   Pulse 80   Ht 5\' 3"  (1.6 m)   Wt (!) 313 lb 12.8 oz (142.3 kg)   SpO2 98%   BMI 55.59 kg/m  , BMI Body mass index is 55.59 kg/m. STOP-Bang Score:  4      Vitals:   08/02/23 1013 08/02/23 1254  BP: (!) 140/87 130/78  Pulse: 80   SpO2: 98%       GEN: Obese, in no acute distress. HEENT: normal. Neck: Supple, no JVD, carotid bruits, or masses. Cardiac: RRR, no murmurs, rubs, or gallops. No clubbing, cyanosis, edema.  Radials 2+/PT 2+ and equal bilaterally.  Respiratory:  Respirations regular and unlabored, clear to auscultation bilaterally. GI: Soft, nontender, nondistended, BS + x 4. MS: no deformity or atrophy. Skin: warm and dry, no rash. Neuro:  Strength and sensation are intact. Psych: Normal affect.  Accessory Clinical Findings    Lab Results  Component Value Date   WBC 10.3 10/19/2022    HGB 12.6 10/19/2022   HCT 38.7 10/19/2022   MCV 87.6 10/19/2022   PLT 375 10/19/2022   Lab Results  Component Value Date   CREATININE 0.96 10/19/2022   BUN 13 10/19/2022   NA 136 10/19/2022   K 3.7 10/19/2022   CL 104 10/19/2022   CO2 24 10/19/2022   Lab Results  Component Value Date   ALT 12 10/01/2018   AST 18 10/01/2018   ALKPHOS 61 10/01/2018   BILITOT 0.7 10/01/2018   Lab Results  Component Value Date   CHOL 134 10/03/2018   HDL 38 (L) 10/03/2018   LDLCALC 83 10/03/2018   TRIG 63 10/03/2018   CHOLHDL 3.5  10/03/2018    Lab Results  Component Value Date   HGBA1C 5.3 10/03/2018   Lab Results  Component Value Date   TSH 3.614 10/03/2018       Assessment & Plan    1.  Presyncope/syncope/PSVT: Patient notes a history of presyncope and syncope dating back to age 37.  Symptoms occur almost exclusively while standing or while having a bowel movement and typically occur about once a month.  Last episode about 2 months ago while wearing ZIO monitor, at which time she became presyncopal and triggered the monitor.  The only triggered event was associated with a run of SVT up to 190 bpm, lasting 1 minute and 21 seconds.  Symptoms resolved spontaneously.  We discussed monitoring results in detail today.  I am adding Toprol -XL 25 mg daily.  We discussed the importance of smoking cessation, alcohol avoidance (does not currently drink), and limiting caffeine (currently drinking up to 8 cups of coffee a day).  She will continue to monitor symptoms at home.  2.  Sleep disordered breathing: Patient with a history of snoring and daytime/nighttime (she works nights) sleepiness.  Echo was notable for RVSP of 32.5 mmHg and mild to moderate TR.  We discussed likelihood of sleep apnea and she was willing to accept referral to pulmonology for additional testing.  3.  Tobacco abuse: Currently smoking a third of a pack of cigarettes daily.  Complete cessation advised.  She previous tried gum  but did not like the way it tasted or felt in her mouth.  We discussed nicotine  patches as a potential option-7 mg daily.  She notes that smoking has been a good stress relief for her however, she recognizes the importance of cessation.  She is currently in the contemplation stage and believes she is capable of quitting.  Complete cessation strongly advised.  4.  Morbid obesity: Currently on semaglutide .  Weight is down slightly since her last visit.  Strongly encouraged 30 minutes of moderate activity daily and caloric restriction.  5.  Disposition: Follow-up in clinic in 3 months or sooner if necessary.  Laneta Pintos, NP 08/02/2023, 1:02 PM

## 2023-08-03 LAB — MAGNESIUM: Magnesium: 1.8 mg/dL (ref 1.6–2.3)

## 2023-08-03 LAB — CBC
Hematocrit: 40.9 % (ref 34.0–46.6)
Hemoglobin: 12.8 g/dL (ref 11.1–15.9)
MCH: 28.2 pg (ref 26.6–33.0)
MCHC: 31.3 g/dL — ABNORMAL LOW (ref 31.5–35.7)
MCV: 90 fL (ref 79–97)
Platelets: 346 10*3/uL (ref 150–450)
RBC: 4.54 x10E6/uL (ref 3.77–5.28)
RDW: 13 % (ref 11.7–15.4)
WBC: 8.1 10*3/uL (ref 3.4–10.8)

## 2023-08-03 LAB — BASIC METABOLIC PANEL
BUN/Creatinine Ratio: 13 (ref 9–23)
BUN: 11 mg/dL (ref 6–20)
CO2: 21 mmol/L (ref 20–29)
Calcium: 8.9 mg/dL (ref 8.7–10.2)
Chloride: 102 mmol/L (ref 96–106)
Creatinine, Ser: 0.84 mg/dL (ref 0.57–1.00)
Glucose: 86 mg/dL (ref 70–99)
Potassium: 3.9 mmol/L (ref 3.5–5.2)
Sodium: 139 mmol/L (ref 134–144)
eGFR: 95 mL/min/{1.73_m2} (ref >59)

## 2023-08-03 LAB — TSH: TSH: 2.01 u[IU]/mL (ref 0.450–4.500)

## 2023-08-18 ENCOUNTER — Ambulatory Visit (INDEPENDENT_AMBULATORY_CARE_PROVIDER_SITE_OTHER): Payer: 59 | Admitting: Podiatry

## 2023-08-18 ENCOUNTER — Ambulatory Visit (INDEPENDENT_AMBULATORY_CARE_PROVIDER_SITE_OTHER): Payer: 59

## 2023-08-18 DIAGNOSIS — M7741 Metatarsalgia, right foot: Secondary | ICD-10-CM

## 2023-08-18 DIAGNOSIS — M7742 Metatarsalgia, left foot: Secondary | ICD-10-CM

## 2023-08-18 DIAGNOSIS — M960 Pseudarthrosis after fusion or arthrodesis: Secondary | ICD-10-CM | POA: Diagnosis not present

## 2023-08-22 ENCOUNTER — Encounter: Payer: Self-pay | Admitting: Sleep Medicine

## 2023-08-22 ENCOUNTER — Institutional Professional Consult (permissible substitution): Payer: 59 | Admitting: Sleep Medicine

## 2023-08-22 ENCOUNTER — Ambulatory Visit (INDEPENDENT_AMBULATORY_CARE_PROVIDER_SITE_OTHER): Payer: 59 | Admitting: Sleep Medicine

## 2023-08-22 ENCOUNTER — Telehealth: Payer: Self-pay | Admitting: Cardiology

## 2023-08-22 VITALS — BP 114/72 | HR 80 | Temp 97.1°F | Ht 63.0 in | Wt 312.6 lb

## 2023-08-22 DIAGNOSIS — R0683 Snoring: Secondary | ICD-10-CM | POA: Diagnosis not present

## 2023-08-22 DIAGNOSIS — F419 Anxiety disorder, unspecified: Secondary | ICD-10-CM

## 2023-08-22 DIAGNOSIS — Z6841 Body Mass Index (BMI) 40.0 and over, adult: Secondary | ICD-10-CM

## 2023-08-22 DIAGNOSIS — G4726 Circadian rhythm sleep disorder, shift work type: Secondary | ICD-10-CM | POA: Diagnosis not present

## 2023-08-22 DIAGNOSIS — G4733 Obstructive sleep apnea (adult) (pediatric): Secondary | ICD-10-CM

## 2023-08-22 MED ORDER — SEMAGLUTIDE-WEIGHT MANAGEMENT 1.7 MG/0.75ML ~~LOC~~ SOAJ: 1.7000 mg | SUBCUTANEOUS | 0 refills | Status: AC

## 2023-08-22 NOTE — Telephone Encounter (Signed)
*  STAT* If patient is at the pharmacy, call can be transferred to refill team.   1. Which medications need to be refilled? (please list name of each medication and dose if known)  Semaglutide -Weight Management 1.7 MG/0.75ML SOAJ    2. Would you like to learn more about the convenience, safety, & potential cost savings by using the [facility]? N/A   3. Are you open to using the [facility] (Type [facility]. N/A   4. Which pharmacy/location (including street and city if local pharmacy) is medication to be sent to?  [id number] [place],  - [address]     5. Do they need a 30 day or 90 day supply? 28 day supply

## 2023-08-22 NOTE — Patient Instructions (Addendum)
Will complete a home sleep study and follow up to review results and treatment plan. Do not drive drowsy for your safety and others.

## 2023-08-22 NOTE — Telephone Encounter (Signed)
 Please review

## 2023-08-22 NOTE — Progress Notes (Signed)
 Name:Maria Espinoza MRN: [id number] DOB: October 12, 1990   CHIEF COMPLAINT:  Reassessment of OSA   HISTORY OF PRESENT ILLNESS:  Maria Espinoza is a 33 y.o. w/ a h/o OSA, morbid obesity, PSVT, anxiety and depression who presents for reassessment of OSA. Reports that she was initially diagnosed with OSA in childhood and subsequently underwent T&A. Reports a 50-80 lb weight gain over the last few years. Reports c/o loud snoring, witnessed apnea and excessive daytime sleepiness which has been present for several years. Reports nocturnal awakenings due to unclear reasons. Admits to dry mouth, morning headaches and occasional night sweats. Denies RLS symptoms or dream enactment. Denies a family history of sleep apnea. Denies drowsy driving. Drinks 2-3 cups of coffee daily, occasional alcohol use, former smoker, denies illicit drug use. Patient works overnight as a scientist, clinical (histocompatibility and immunogenetics) at [employer]. States that she works 11 pm-7 am full time.   Bedtime 3 pm Sleep onset 30 mins Rise time 10 pm   EPWORTH SLEEP SCORE 13   PAST MEDICAL HISTORY :   has a past medical history of Anemia, Anxiety, Bipolar depression (HCC), Depression, Difficult intubation, Dizziness, Migraines, Nausea, PSVT (paroxysmal supraventricular tachycardia) (HCC), Rectal bleeding, and Syncope and collapse.  has a past surgical history that includes Tonsillectomy; Metatarsal osteotomy (Left, 01/15/2021); Hardware Removal (Left, 05/14/2021); Wisdom tooth extraction; Metatarsal osteotomy (Right, 05/28/2021); Hardware Removal (Right, 07/23/2021); and Metatarsal osteotomy (Right, 05/27/2022). Prior to Admission medications   Medication Sig Start Date End Date Taking? Authorizing Provider  clotrimazole -betamethasone  (LOTRISONE ) cream Apply 1 Application topically 2 (two) times daily. 01/21/22   Janit Thresa HERO, DPM  metoprolol  succinate (TOPROL -XL) 25 MG 24 hr tablet Take 1 tablet (25 mg total) by mouth daily. 08/02/23   Vivienne Lonni Ingle, NP  Multiple Vitamin (MULTIVITAMIN) tablet Take 1 tablet by mouth daily.    [provider]  Semaglutide -Weight Management 1 MG/0.5ML SOAJ Inject 1 mg into the skin once a week for 28 days. 08/02/23 08/30/23  Darliss Rogue, MD  Semaglutide -Weight Management 1.7 MG/0.75ML SOAJ Inject 1.7 mg into the skin once a week for 28 days. Patient not taking: Reported on 08/02/2023 08/31/23 09/28/23  Darliss Rogue, MD  Semaglutide -Weight Management 2.4 MG/0.75ML SOAJ Inject 2.4 mg into the skin once a week. Patient not taking: Reported on 08/02/2023 09/29/23 01/19/24  Darliss Rogue, MD  sertraline  (ZOLOFT ) 100 MG tablet Take 100 mg by mouth daily.    [provider]  terbinafine  (LAMISIL ) 250 MG tablet Take 1 tablet (250 mg total) by mouth daily. Patient not taking: Reported on 08/02/2023 05/09/22   Janit Thresa HERO, DPM  traZODone  (DESYREL ) 150 MG tablet Take 150 mg by mouth at bedtime as needed for sleep.    [provider]  Vitamin D , Ergocalciferol , (DRISDOL ) 1.25 MG (50000 UNIT) CAPS capsule Take 1 capsule (50,000 Units total) by mouth every 7 (seven) days. Patient not taking: Reported on 08/02/2023 01/22/22   Janit Thresa HERO, DPM  VRAYLAR 3 MG capsule Take 3 mg by mouth at bedtime. 05/12/21   [provider]   Allergies  Allergen Reactions   Latex Rash    FAMILY HISTORY:  family history includes Breast cancer in an other family member; Breast cancer (age of onset: 60) in her paternal grandfather; Depression in her maternal grandmother; Heart disease in her maternal grandmother; Hypertension in her maternal grandmother; Migraines in her father. SOCIAL HISTORY:  reports that she has been smoking cigarettes. She has a 0.8 pack-year smoking history. She  has been exposed to tobacco smoke. She has never used smokeless tobacco. She reports current alcohol use. She reports that she does not use drugs.   Review of Systems:  Gen:  Denies  fever, sweats, chills  weight loss  HEENT: Denies blurred vision, double vision, ear pain, eye pain, hearing loss, nose bleeds, sore throat Cardiac:  No dizziness, chest pain or heaviness, chest tightness,edema, No JVD Resp:   No cough, -sputum production, -shortness of breath,-wheezing, -hemoptysis,  Gi: Denies swallowing difficulty, stomach pain, nausea or vomiting, diarrhea, constipation, bowel incontinence Gu:  Denies bladder incontinence, burning urine Ext:   Denies Joint pain, stiffness or swelling Skin: Denies  skin rash, easy bruising or bleeding or hives Endoc:  Denies polyuria, polydipsia , polyphagia or weight change Psych:   Denies depression, insomnia or hallucinations  Other:  All other systems negative  VITAL SIGNS: BP 114/72 (BP Location: Right Arm, Cuff Size: Large)   Pulse 80   Temp (!) 97.1 F (36.2 C)   Ht 5' 3 (1.6 m)   Wt (!) 312 lb 9.6 oz (141.8 kg)   SpO2 98%   BMI 55.37 kg/m     Physical Examination:   General Appearance: No distress  EYES PERRLA, EOM intact.   NECK Supple, No JVD Throat Mallampati IV Pulmonary: normal breath sounds, No wheezing.  CardiovascularNormal S1,S2.  No m/r/g.   Abdomen: Benign, Soft, non-tender. Skin:   warm, no rashes, no ecchymosis  Extremities: normal, no cyanosis, clubbing. Neuro:without focal findings,  speech normal  PSYCHIATRIC: Mood, affect within normal limits.   ASSESSMENT AND PLAN  OSA I suspect that OSA is likely present due to clinical presentation. Discussed the consequences of untreated sleep apnea. Advised not to drive drowsy for safety of patient and others. Will complete further evaluation with a home sleep study and follow up to review results.    Shift work sleep disorder Counseled patient on keeping a regular sleep schedule on all days. Also discussed the importance sleeping 7-8 hours per night.    HTN Stable, on current management. Following with PCP.   Morbid obesity Counseled patient on diet and lifestyle  modification.   Anxiety Stable, on current management.    MEDICATION ADJUSTMENTS/LABS AND TESTS ORDERED: Recommend Home Sleep Study   Patient  satisfied with Plan of action and management. All questions answered  Follow up to review HST results and treatment plan.   I spent a total of 45 minutes reviewing chart data, face-to-face evaluation with the patient, counseling and coordination of care as detailed above.    Lavita Pontius, M.D.  Sleep Medicine [facility]

## 2023-08-23 ENCOUNTER — Encounter: Payer: Self-pay | Admitting: Podiatry

## 2023-08-29 ENCOUNTER — Ambulatory Visit
Admission: RE | Admit: 2023-08-29 | Discharge: 2023-08-29 | Disposition: A | Discharge status: Home / Self Care | Payer: 59 | Source: Ambulatory Visit | Attending: Podiatry

## 2023-08-29 DIAGNOSIS — G8929 Other chronic pain: Secondary | ICD-10-CM | POA: Diagnosis not present

## 2023-08-29 DIAGNOSIS — M79671 Pain in right foot: Secondary | ICD-10-CM | POA: Diagnosis not present

## 2023-08-29 DIAGNOSIS — M960 Pseudarthrosis after fusion or arthrodesis: Secondary | ICD-10-CM

## 2023-09-11 ENCOUNTER — Emergency Department: Payer: 59

## 2023-09-11 ENCOUNTER — Other Ambulatory Visit: Payer: Self-pay

## 2023-09-11 ENCOUNTER — Emergency Department
Admission: EM | Admit: 2023-09-11 | Discharge: 2023-09-11 | Disposition: A | Discharge status: Home / Self Care | Payer: 59 | Attending: Emergency Medicine | Admitting: Emergency Medicine

## 2023-09-11 DIAGNOSIS — R079 Chest pain, unspecified: Secondary | ICD-10-CM | POA: Diagnosis not present

## 2023-09-11 DIAGNOSIS — I471 Supraventricular tachycardia, unspecified: Secondary | ICD-10-CM | POA: Diagnosis not present

## 2023-09-11 DIAGNOSIS — R002 Palpitations: Secondary | ICD-10-CM | POA: Diagnosis present

## 2023-09-11 LAB — BASIC METABOLIC PANEL
Anion gap: 12 (ref 5–15)
BUN: 14 mg/dL (ref 6–20)
CO2: 21 mmol/L — ABNORMAL LOW (ref 22–32)
Calcium: 8.7 mg/dL — ABNORMAL LOW (ref 8.9–10.3)
Chloride: 105 mmol/L (ref 98–111)
Creatinine, Ser: 0.95 mg/dL (ref 0.44–1.00)
GFR, Estimated: >60 mL/min (ref >60)
Glucose, Bld: 103 mg/dL — ABNORMAL HIGH (ref 70–99)
Potassium: 4 mmol/L (ref 3.5–5.1)
Sodium: 138 mmol/L (ref 135–145)

## 2023-09-11 LAB — CBC
HCT: 40.5 % (ref 36.0–46.0)
Hemoglobin: 13.3 g/dL (ref 12.0–15.0)
MCH: 28.1 pg (ref 26.0–34.0)
MCHC: 32.8 g/dL (ref 30.0–36.0)
MCV: 85.6 fL (ref 80.0–100.0)
Platelets: 401 10*3/uL — ABNORMAL HIGH (ref 150–400)
RBC: 4.73 MIL/uL (ref 3.87–5.11)
RDW: 14.3 % (ref 11.5–15.5)
WBC: 10.9 10*3/uL — ABNORMAL HIGH (ref 4.0–10.5)
nRBC: 0 % (ref 0.0–0.2)

## 2023-09-11 LAB — TROPONIN I (HIGH SENSITIVITY): Troponin I (High Sensitivity): 55 ng/L — ABNORMAL HIGH (ref <18)

## 2023-09-11 MED ORDER — ADENOSINE 12 MG/4ML IV SOLN: 12.0000 mg | Freq: Once | INTRAVENOUS | Status: DC

## 2023-09-11 MED ORDER — ADENOSINE 6 MG/2ML IV SOLN
6.0000 mg | Freq: Once | INTRAVENOUS | Status: DC
  Filled 2023-09-11: qty 2

## 2023-09-11 NOTE — Discharge Instructions (Signed)
 Contact your cardiologist to arrange for follow-up as needed.  Return to the ER for new, worsening or persistent palpitations, shortness of breath, chest pain, or any other new or worsening symptoms that concern you.

## 2023-09-11 NOTE — ED Notes (Addendum)
 Change in heart rhythm, provider at bedside, EKG shows NSR.

## 2023-09-11 NOTE — ED Triage Notes (Signed)
 Pt reports palpitations all day today, hx of same pt has recently been following up with cardiologist. Pt states she took 25mg  metoprolol around 1400 this afternoon with no relief. Pt reports feeling like her chest is tight.

## 2023-09-11 NOTE — ED Provider Notes (Signed)
 [facility] Provider Note    Event Date/Time   First MD Initiated Contact with Patient 09/11/23 2039     (approximate)   History   Palpitations   HPI  Maria Espinoza is a 33 y.o. female with a history of SVT, on metoprolol, who presents with palpitations, acute onset when she awoke this morning.  The patient reports a sensation of her heart racing, tightness in her chest, and feeling lightheaded.  She denies feeling significantly short of breath.  She has no leg swelling.  She states that she has never been in SVT for this Espinoza.  She took her metoprolol around 2 PM with no relief.  I reviewed the past medical records.  The patient was last seen by cardiology on 1/15 for follow-up related to her SVT and was doing well at that time.   Physical Exam   Triage Vital Signs: ED Triage Vitals  Encounter Vitals Group     BP 09/11/23 2013 122/78     Systolic BP Percentile --      Diastolic BP Percentile --      Pulse Rate 09/11/23 2012 (!) 154     Resp 09/11/23 2012 18     Temp 09/11/23 2012 98 F (36.7 C)     Temp Source 09/11/23 2012 Oral     SpO2 09/11/23 2012 100 %     Weight 09/11/23 2013 290 lb (131.5 kg)     Height 09/11/23 2013 5\' 3"  (1.6 m)     Head Circumference --      Peak Flow --      Pain Score 09/11/23 2012 7     Pain Loc --      Pain Education --      Exclude from Growth Chart --     Most recent vital signs: Vitals:   09/11/23 2130 09/11/23 2200  BP: 115/80 103/68  Pulse: 91 86  Resp: (!) 26 17  Temp:    SpO2: 100% 97%     General: Awake, no distress.  CV:  Good peripheral perfusion.  Tachycardic, regular rhythm. Resp:  Normal effort.  Lungs CTAB. Abd:  No distention.  Other:  No peripheral edema.   ED Results / Procedures / Treatments   Labs (all labs ordered are listed, but only abnormal results are displayed) Labs Reviewed  BASIC METABOLIC PANEL - Abnormal; Notable for the following components:      Result Value    CO2 21 (*)    Glucose, Bld 103 (*)    Calcium 8.7 (*)    All other components within normal limits  CBC - Abnormal; Notable for the following components:   WBC 10.9 (*)    Platelets 401 (*)    All other components within normal limits  TROPONIN I (HIGH SENSITIVITY) - Abnormal; Notable for the following components:   Troponin I (High Sensitivity) 55 (*)    All other components within normal limits  POC URINE PREG, ED     EKG  ED ECG REPORT I, Dionne Bucy, the attending physician, personally viewed and interpreted this ECG.  Date: 09/11/2023 EKG Time: 2017 Rate: 169 Rhythm: Supraventricular tachycardia QRS Axis: normal Intervals: normal ST/T Wave abnormalities: normal Narrative Interpretation: no evidence of acute ischemia   ED ECG REPORT I, Dionne Bucy, the attending physician, personally viewed and interpreted this ECG.  Date: 09/11/2023 EKG Time: 2059 Rate: 99 Rhythm: normal sinus rhythm QRS Axis: normal Intervals: normal ST/T Wave abnormalities: normal Narrative Interpretation:  no evidence of acute ischemia   RADIOLOGY  Chest x-ray: I independently viewed and interpreted the images; there is no focal consolidation or edema   PROCEDURES:  Critical Care performed: Yes, see critical care procedure note(s)  .Critical Care  Performed by: Dionne Bucy, MD Authorized by: Dionne Bucy, MD   Critical care provider statement:    Critical care time (minutes):  15   Critical care time was exclusive of:  Separately billable procedures and treating other patients   Critical care was necessary to treat or prevent imminent or life-threatening deterioration of the following conditions:  Cardiac failure   Critical care was time spent personally by me on the following activities:  Development of treatment plan with patient or surrogate, discussions with consultants, evaluation of patient's response to treatment, examination of patient, ordering  and review of laboratory studies, ordering and review of radiographic studies, ordering and performing treatments and interventions, pulse oximetry, re-evaluation of patient's condition, review of old charts and obtaining history from patient or surrogate    MEDICATIONS ORDERED IN ED: Medications - No data to display    IMPRESSION / MDM / ASSESSMENT AND PLAN / ED COURSE  I reviewed the triage vital signs and the nursing notes.  33 year old female with PMH as noted above presents with palpitations since she woke this morning with some lightheadedness and chest tightness.  On exam the patient is tachycardic to the 160s.  EKG shows SVT.  She has no respiratory distress.  Differential diagnosis includes, but is not limited to, SVT, other tachydysrhythmia.  There is no clinical evidence for CHF or ACS.  We will plan to treat with adenosine, obtain chest x-ray and labs and reassess.  Patient's presentation is most consistent with acute presentation with potential threat to life or bodily function.  The patient is on the cardiac monitor to evaluate for evidence of arrhythmia and/or significant heart rate changes.  ----------------------------------------- 9:00 PM on 09/11/2023 -----------------------------------------  While preparing for treatment with adenosine and after discussing the plan with the patient, she converted to sinus rhythm spontaneously.  Repeat EKG shows NSR and is nonischemic.  We will wait for lab workup.  ----------------------------------------- 10:20 PM on 09/11/2023 -----------------------------------------  The patient has remained in sinus rhythm.  BMP and CBC are unremarkable.  Troponin is minimally elevated likely due to the high heart rate.  I consulted and discussed the case with Dr. Nelly Laurence from cardiology who reviewed the EKGs and lab workup.  He advises that there is no concern for ischemia and does not recommend serial troponins.  On reassessment the patient  is asymptomatic.  She feels well.  She is appropriate for discharge home.  I counseled her on the results of the workup and plan of care.  I gave strict return precautions and she expressed understanding.  FINAL CLINICAL IMPRESSION(S) / ED DIAGNOSES   Final diagnoses:  SVT (supraventricular tachycardia) (HCC)     Rx / DC Orders   ED Discharge Orders     None        Note:  This document was prepared using Dragon voice recognition software and may include unintentional dictation errors.    Dionne Bucy, MD 09/11/23 2250

## 2023-09-12 ENCOUNTER — Telehealth: Payer: Self-pay | Admitting: Cardiology

## 2023-09-12 NOTE — Telephone Encounter (Signed)
 STAT if HR is under 50 or over 120 (normal HR is 60-100 beats per minute)  What is your heart rate? 174 all day yesterday went to ER last night  Do you have a log of your heart rate readings (document readings)? no  Do you have any other symptoms? Headache, chest tightness

## 2023-09-12 NOTE — Telephone Encounter (Signed)
 Called patient, she states she went to the ED yesterday (notes in epic) advised she had an SVT episode that lasted all day. Patient states she felt so bad - her chest became tight which is why she went to the ED. Patient tried doing maneuvers at home to help but they did not. She did take her Metoprolol 25 mg at around 2 PM yesterday (she was late on taking this). She normally takes it first thing in the morning. Patient states she feels okay today, only having a headache and feeling dizzy at times. No high HR's at this time. She states since she left the hospital she has not had another episode, but this one last a long time for her and she would like to be seen sooner than April.   Scheduled patient for an appointment with NP on 03/07. Patient verbalized understanding, thankful for call back.

## 2023-09-21 NOTE — Progress Notes (Signed)
 Cardiology Clinic Note   Date: 09/22/2023 ID: Maria Espinoza, DOB September 28, 1990, MRN [id number]  Primary Cardiologist:  Debbe Odea, MD  Chief Complaint   Maria Espinoza is a 33 y.o. female who presents to the clinic today for hospital follow up.   Patient Profile   Maria Espinoza is followed by Dr. Azucena Espinoza for the history outlined below.      Past medical history significant for: Syncope. Echo 06/27/2023: EF 60 to 65%.  No RWMA.  Normal diastolic parameters.  Normal RV size/function.  Normal PA pressure, RVSP 32.5 mmHg.  Mild MR.  Mild to moderate TR.  Mild AI without AS. Palpitations/SVT. 14-day ZIO 06/30/2023: HR 40 to 190 bpm, average 78 bpm.  Predominantly sinus rhythm.  1 run of SVT lasting 1 minute 21 seconds with max rate 190 bpm, average 171 bpm.  Second-degree AV block was present.  SVT detected within +/- 45 seconds of symptomatic patient event.  Rare ectopy.  No A-fib/a-flutter.  No other significant arrhythmias. Anxiety. Bipolar disorder. Tobacco abuse.  In summary, patient was first evaluated by Dr. Azucena Espinoza in November 2024 for syncope.  Patient reported presyncopal and syncopal events occurring since age 58 sometimes associated with palpitations.  Events occurred exclusively while standing or on a few occasions while having a bowel movement.  Episodes are preceded by blurred vision, nausea and flushed feeling lasting 1 to 2 minutes followed by syncope if she is unable to lie down.  Echo demonstrated normal LV/RV function as detailed above.  2-week ZIO demonstrated 1 run of SVT as detailed above.  Patient was last seen in the office by Maria Givens, NP on 08/02/2023 for routine follow-up.  She was doing well at that time with no presyncopal or syncopal events.  She was referred to pulmonology for sleep study.  No other changes were made.  Patient presented to the ED on 09/11/2023 with report of palpitations described as heart racing, tightness in chest and  lightheadedness.  She took a dose of metoprolol with no relief EKG demonstrated SVT, HR in the 160s.  Plan was to administer adenosine however patient converted to sinus rhythm spontaneously.  Labs demonstrated WBC 10.9, hemoglobin 13.3, sodium 138, potassium 4, creatinine 0.95, BUN 14.  Troponin 55.  Patient maintained sinus rhythm throughout her stay in the ED.  Dr. Nelly Laurence reviewed EKG strips.  He felt slight increase in troponin secondary to elevated heart rate and serial troponins not indicated.  Patient was discharged home.  Patient contacted the office on 09/12/2023 to discuss ED visit.  She reported no further episodes of SVT since her discharge from the ED.  She was scheduled for follow-up visit.     History of Present Illness    Today, patient reports since her ED visit she had one episode of SVT that lasted 15 minutes before it broke. She is frustrated that episodes of palpitations are more frequent. She has at least 1-2 episodes a day of brief palpitations lasting under 1 minutes. She has longer episodes that last minutes before resolving. She has a history of syncope with episodes of palpitations. She states she knows it is going to happen. Last syncopal episode 3 months ago. No chest tightness outside of episodes of palpitations. She has chronic swelling in right foot secondary to an injury. No left lower extremity edema. She also reports that she feels Reginal Lutes is not helping her with weight loss. She has changed her diet along with the medication and has not lost any weight.  ROS: All other systems reviewed and are otherwise negative except as noted in History of Present Illness.  EKGs/Labs Reviewed    EKG Interpretation Date/Time:  Friday September 22 2023 13:27:37 EST Ventricular Rate:  72 PR Interval:  208 QRS Duration:  80 QT Interval:  410 QTC Calculation: 448 R Axis:   17  Text Interpretation: Normal sinus rhythm Nonspecific T wave abnormality When compared with ECG of  11-Sep-2023 20:17, Vent. rate has decreased BY  97 BPM Questionable change in QRS axis Confirmed by Carlos Levering [phone]) on 09/22/2023 1:30:46 PM   09/11/2023: BUN 14; Creatinine, Ser 0.95; Potassium 4.0; Sodium 138   09/11/2023: Hemoglobin 13.3; WBC 10.9   08/02/2023: TSH 2.010    Physical Exam    VS:  BP 126/72 (BP Location: Left Arm, Patient Position: Sitting, Cuff Size: Normal)   Pulse 72   Ht 5\' 3"  (1.6 m)   Wt (!) 318 lb 6.4 oz (144.4 kg)   SpO2 99%   BMI 56.40 kg/m  , BMI Body mass index is 56.4 kg/m.  GEN: Well nourished, well developed, in no acute distress. Neck: No JVD or carotid bruits. Cardiac:  RRR. No murmurs. No rubs or gallops.   Respiratory:  Respirations regular and unlabored. Clear to auscultation without rales, wheezing or rhonchi. GI: Soft, nontender, nondistended. Extremities: Radials/DP/PT 2+ and equal bilaterally. No clubbing or cyanosis. No edema.  Skin: Warm and dry, no rash. Neuro: Strength intact.  Assessment & Plan   Palpitations/SVT 14-day ZIO December 2024 showed 1 run of SVT lasting 1 minute 21 seconds with max rate of 190 bpm, SVT was detected within +/- 45 seconds of symptomatic patient event.  Patient recently seen in the ED at the end of February 2025 for a run of SVT that lasted all day.  Heart rate in the 160s.  Plan was to administer adenosine however patient converted to NSR spontaneously.  Patient reports she had one prolonged episode of palpitations since ED discharge that lasted 15 minutes. She has daily palpitations lasting <1 minutes 1-2 times a day at a minimum, sometimes more. She reports associated syncope with episodes. Last syncopal episode 3 months ago. RRR on exam today. Given how symptomatic she is with runs of SVT will refer her to EP. She is in agreement with this.  -Continue Toprol 25 mg daily. -Add metoprolol tartrate 12.5 mg up to 2 times a day prn breakthrough palpitations lasting >10 minutes.  -Refer to EP.    Obesity Patient was started on Wegovy by Dr. Azucena Espinoza. She has changed her diet but is not losing weight.  -Refer to health weight and wellness.   Disposition: Renew Toprol. Add metoprolol tartrate 12.5 mg bid prn breakthrough palpitations. Refer to EP. Refer to health weight and wellness. Return in 6 months or sooner as needed.          Signed, Etta Grandchild. Clytee Heinrich, DNP, NP-C

## 2023-09-22 ENCOUNTER — Ambulatory Visit: Payer: 59 | Attending: Student | Admitting: Student

## 2023-09-22 ENCOUNTER — Encounter: Payer: Self-pay | Admitting: Student

## 2023-09-22 VITALS — BP 126/72 | HR 72 | Ht 63.0 in | Wt 318.4 lb

## 2023-09-22 DIAGNOSIS — R002 Palpitations: Secondary | ICD-10-CM | POA: Diagnosis present

## 2023-09-22 DIAGNOSIS — Z6841 Body Mass Index (BMI) 40.0 and over, adult: Secondary | ICD-10-CM | POA: Diagnosis present

## 2023-09-22 DIAGNOSIS — I471 Supraventricular tachycardia, unspecified: Secondary | ICD-10-CM | POA: Insufficient documentation

## 2023-09-22 MED ORDER — METOPROLOL TARTRATE 25 MG PO TABS: ORAL_TABLET | ORAL | 3 refills | Status: DC

## 2023-09-22 MED ORDER — METOPROLOL SUCCINATE ER 25 MG PO TB24: 25.0000 mg | ORAL_TABLET | Freq: Every day | ORAL | 1 refills | Status: DC

## 2023-09-22 NOTE — Patient Instructions (Signed)
 Medication Instructions:  Start metoprolol tartrate 12.5 mg as needed for palpitations up to twice a day, this will be 1/2 of the 25 mg tablet *If you need a refill on your cardiac medications before your next appointment, please call your pharmacy*  Lab Work: None ordered If you have labs (blood work) drawn today and your tests are completely normal, you will receive your results only by: MyChart Message (if you have MyChart) OR A paper copy in the mail If you have any lab test that is abnormal or we need to change your treatment, we will call you to review the results.  Follow-Up: At [facility], you and your health needs are our priority.  As part of our continuing mission to provide you with exceptional heart care, we have created designated Provider Care Teams.  These Care Teams include your primary Cardiologist (physician) and Advanced Practice Providers (APPs -  Physician Assistants and Nurse Practitioners) who all work together to provide you with the care you need, when you need it.  Your next appointment:   6 month(s)  Provider:   You may see Debbe Odea, MD or one of the following Advanced Practice Providers on your designated Care Team:   Nicolasa Ducking, NP Eula Listen, PA-C Cadence Fransico Michael, PA-C Charlsie Quest, NP Carlos Levering, NP   Other Instructions You have been referred to Health weight and wellness. You have been referred to see EP electrophysiology here in our office.

## 2023-09-25 ENCOUNTER — Encounter (INDEPENDENT_AMBULATORY_CARE_PROVIDER_SITE_OTHER): Payer: Self-pay

## 2023-10-09 ENCOUNTER — Encounter

## 2023-10-09 DIAGNOSIS — G4733 Obstructive sleep apnea (adult) (pediatric): Secondary | ICD-10-CM

## 2023-10-24 NOTE — Progress Notes (Deleted)
  Electrophysiology Office Note:    Date:  10/24/2023   ID:  Maria Espinoza, DOB June 15, 1991, MRN [id number]  [facility] Cardiologist:  Debbe Odea, MD  [facility] Electrophysiologist:  Lanier Prude, MD   Referring MD: Carlos Levering, NP   Chief Complaint: SVT  History of Present Illness:    [name] is a 33yo woman who I am seeing today for an evaluation of SVT at the request of Carlos Levering. She saw Gavin Pound 09/22/2023. At that appointment she reported brief episodes of palpitations lasting < 1 minute occurring at least daily. She also reports syncope. Prior Maria Espinoza showed an episode of SVT lasting 81 seconds with a HR 190 bpm. She was seen in the ER in February for an episode of SVT with rates in the 160s. It spontaneously converted before Adenosine was given. She takes toprol and as needed lopressor.       Their past medical, social and family history was reviewed.   ROS:   Please see the history of present illness.    All other systems reviewed and are negative.  EKGs/Labs/Other Studies Reviewed:    The following studies were reviewed today:  06/05/2023 Zio personally reviewed HR 40 - 190, avg 78 SVT (symptomatic) with rate 171 bpm on average Rare ectopy SVT starts and ends with a premature beat.     09/22/2023 ECG shows sinus. No preexcitation. PR 208. QRS narrow.      Physical Exam:    VS:  There were no vitals taken for this visit.    Wt Readings from Last 3 Encounters:  09/22/23 (!) 318 lb 6.4 oz (144.4 kg)  09/11/23 290 lb (131.5 kg)  08/22/23 (!) 312 lb 9.6 oz (141.8 kg)     GEN: no distress CARD: RRR, No MRG RESP: No IWOB. CTAB.        ASSESSMENT AND PLAN:    No diagnosis found.  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 Her body weight puts her at prohibitive risk for EP study and ablation. I have  recommended medical management while she loses weight. We discussed options including metoprolol and antiarrhythmics.  ***flecainide 50mg  PO BID?  #Obesity We discussed how her weight may be influencing the frequency of her arrhythmia. We discussed the link between excess body weight and procedural risk. She has been referred to the healthy weight clinic.          Signed, Rossie Muskrat. Lalla Brothers, MD, [facility], [facility] 10/24/2023 8:44 PM    Electrophysiology [facility]

## 2023-10-25 ENCOUNTER — Ambulatory Visit: Admitting: Cardiology

## 2023-10-25 DIAGNOSIS — I471 Supraventricular tachycardia, unspecified: Secondary | ICD-10-CM

## 2023-11-01 ENCOUNTER — Ambulatory Visit: Payer: 59 | Admitting: Nurse Practitioner

## 2023-11-01 DIAGNOSIS — G4733 Obstructive sleep apnea (adult) (pediatric): Secondary | ICD-10-CM | POA: Diagnosis not present

## 2023-11-02 ENCOUNTER — Other Ambulatory Visit: Payer: Self-pay

## 2023-11-02 MED ORDER — SEMAGLUTIDE-WEIGHT MANAGEMENT 2.4 MG/0.75ML ~~LOC~~ SOAJ: 2.4000 mg | SUBCUTANEOUS | 3 refills | Status: AC

## 2023-11-06 DIAGNOSIS — G4733 Obstructive sleep apnea (adult) (pediatric): Secondary | ICD-10-CM

## 2023-11-10 ENCOUNTER — Ambulatory Visit: Admitting: Nurse Practitioner

## 2023-11-10 NOTE — Progress Notes (Deleted)
 Office Visit    Patient Name: Maria Espinoza Date of Encounter: 11/10/2023  Primary Care Provider:  Theron Flavin, MD Primary Cardiologist:  Constancia Delton, MD  Chief Complaint    33 y.o. female w/ a h/o anxiety, bipolar d/o, tob abuse, obesity, and syncope, who presents for follow-up related to SVT.  Past Medical History  Subjective   Past Medical History:  Diagnosis Date   Anemia    Anxiety    Bipolar depression (HCC)    Depression    Difficult intubation    x 1 with foot surgery   Dizziness    Migraines    Nausea    PSVT (paroxysmal supraventricular tachycardia) (HCC)    a.05/2023 Zio: Predominant sinus rhythm (40-190, average 78). 1 run SVT x 36m 21s, max rate 190-->assoc w/ Ss.   Rectal bleeding    Syncope and collapse    a. 06/2023 Echo: EF 60-65%, no rwma, nl RV fxn, RVSP 32.5 mmHg. Mild MR, mild-mod TR; b.05/2023 Zio: Predominant sinus rhythm (40-190, average 78). 1 run SVT x 20m 21s, max rate 190-->assoc w/ Ss.   Past Surgical History:  Procedure Laterality Date   HARDWARE REMOVAL Left 05/14/2021   Procedure: LEFT FOOT HARDWARE REMOVAL;  Surgeon: Dot Gazella, DPM;  Location: [facility];  Service: Podiatry;  Laterality: Left;   HARDWARE REMOVAL Right 07/23/2021   Procedure: HARDWARE REMOVAL;  Surgeon: Dot Gazella, DPM;  Location: [facility];  Service: Podiatry;  Laterality: Right;   METATARSAL OSTEOTOMY Left 01/15/2021   Procedure: METATARSAL OSTEOTOMY;  Surgeon: Dot Gazella, DPM;  Location: [facility];  Service: Podiatry;  Laterality: Left;   METATARSAL OSTEOTOMY Right 05/28/2021   Procedure: METATARSAL OSTEOTOMY FOURTH TOE RIGHT FOOT;  Surgeon: Dot Gazella, DPM;  Location: [facility];  Service: Podiatry;  Laterality: Right;   METATARSAL OSTEOTOMY Right 05/27/2022   Procedure: METATARSAL OSTEOTOMY FOURTH TOE;  Surgeon: Dot Gazella, DPM;  Location: [facility];  Service: Podiatry;  Laterality: Right;   TONSILLECTOMY     age 80   WISDOM TOOTH EXTRACTION       Allergies  Allergies  Allergen Reactions   Latex Rash      History of Present Illness      34 y.o. y/o female with the above past medical history including anxiety, bipolar disorder, tobacco abuse, obesity, syncope, and PSVT.  She was previously evaluated in November 2024 with complaints of syncope.  She notes that presyncopal and syncopal spells have been occurring since age 78 and sometimes they are associated with palpitations.  They occur almost exclusively while standing or on a few occasions, while having a bowel movement.  Each episode is preceded by blurred vision, nausea, and flushed feeling which might last 1 to 2 minutes prior to becoming syncopal if she is unable to lie down.  Subsequent echo showed normal LV function with mildly elevated RVSP and mild-moderate TR.  2-week ZIO monitoring was notable for one 1 minute and 21-second run of SVT at a rate of 190 bpm.  This was associated with a triggered event which patient says was similar to prior presyncopal and syncopal events.  She was placed on ? blocker therapy in 07/2023, however, she had recurrent SVT in 08/2023, prompting ED eval, where she was in SVT @ 160.  She converted spontaneously prior to planned adenosine  dose.  Trop rose to 55, which was felt to be due to demand ischemia.   Ms. Killion was last seen in cardiology clinic  on 09/22/2023, at which time she reported ongoing daily episodes of palpitations, typically lasting < 1 min.  She was maintained on long acting ? blocker w/ addition of short acting ? blocker for breakthrough symptoms. She was referred to EP, and is scheduled to be seen 4/30. Objective  Home Medications    Current Outpatient Medications  Medication Sig Dispense Refill   clotrimazole -betamethasone  (LOTRISONE ) cream Apply 1 Application topically 2 (two) times daily. 45 g 1   metoprolol  succinate (TOPROL -XL) 25 MG 24 hr tablet Take 1 tablet (25 mg total) by mouth daily. 90 tablet 1   metoprolol  tartrate  (LOPRESSOR ) 25 MG tablet Take 1/2 tablet by mouth as needed for palpitations, may take up to twice daily. 90 tablet 3   mirtazapine (REMERON) 7.5 MG tablet Take 7.5 mg by mouth at bedtime.     Multiple Vitamin (MULTIVITAMIN) tablet Take 1 tablet by mouth daily.     prazosin (MINIPRESS) 1 MG capsule Take 1-2 mg by mouth at bedtime.     Semaglutide -Weight Management 2.4 MG/0.75ML SOAJ Inject 2.4 mg into the skin once a week. 3 mL 3   sertraline  (ZOLOFT ) 100 MG tablet Take 100 mg by mouth daily.     terbinafine  (LAMISIL ) 250 MG tablet Take 1 tablet (250 mg total) by mouth daily. 30 tablet 0   Vitamin D , Ergocalciferol , (DRISDOL ) 1.25 MG (50000 UNIT) CAPS capsule Take 1 capsule (50,000 Units total) by mouth every 7 (seven) days. 12 capsule 1   VRAYLAR 4.5 MG CAPS Take 1 capsule by mouth daily.     No current facility-administered medications for this visit.     Physical Exam    VS:  There were no vitals taken for this visit. , BMI There is no height or weight on file to calculate BMI. STOP-Bang Score:  4  { Consider Dx Sleep Disordered Breathing or Sleep Apnea  ICD G47.33          :1}      GEN: Well nourished, well developed, in no acute distress. HEENT: normal. Neck: Supple, no JVD, carotid bruits, or masses. Cardiac: RRR, no murmurs, rubs, or gallops. No clubbing, cyanosis, edema.  Radials 2+/PT 2+ and equal bilaterally.  Respiratory:  Respirations regular and unlabored, clear to auscultation bilaterally. GI: Soft, nontender, nondistended, BS + x 4. MS: no deformity or atrophy. Skin: warm and dry, no rash. Neuro:  Strength and sensation are intact. Psych: Normal affect.  Accessory Clinical Findings    ECG personally reviewed by me today -    *** - no acute changes.  Lab Results  Component Value Date   WBC 10.9 (H) 09/11/2023   HGB 13.3 09/11/2023   HCT 40.5 09/11/2023   MCV 85.6 09/11/2023   PLT 401 (H) 09/11/2023   Lab Results  Component Value Date   CREATININE 0.95  09/11/2023   BUN 14 09/11/2023   NA 138 09/11/2023   K 4.0 09/11/2023   CL 105 09/11/2023   CO2 21 (L) 09/11/2023   Lab Results  Component Value Date   ALT 12 10/01/2018   AST 18 10/01/2018   ALKPHOS 61 10/01/2018   BILITOT 0.7 10/01/2018   Lab Results  Component Value Date   CHOL 134 10/03/2018   HDL 38 (L) 10/03/2018   LDLCALC 83 10/03/2018   TRIG 63 10/03/2018   CHOLHDL 3.5 10/03/2018    Lab Results  Component Value Date   HGBA1C 5.3 10/03/2018   Lab Results  Component Value Date  TSH 2.010 08/02/2023       Assessment & Plan    1.  ***  Laneta Pintos, NP 11/10/2023, 2:24 PM

## 2023-11-15 ENCOUNTER — Ambulatory Visit: Attending: Cardiology | Admitting: Cardiology

## 2023-11-15 NOTE — Progress Notes (Deleted)
  Electrophysiology Office Note:    Date:  11/15/2023   ID:  Maria Espinoza, DOB [birth year], MRN [id number]  [facility] Cardiologist:  Constancia Delton, MD  [facility] Electrophysiologist:  Boyce Byes, MD   Referring MD: Morey Ar, NP   Chief Complaint: SVT  History of Present Illness:    Maria Espinoza is a 33 year old woman who I am seeing today for an evaluation of SVT at the request of Dr. Junnie Olives.  The patient was seen most recently by Bernardo Bridgeman September 22, 2023.  Her medical history includes bipolar disorder, anxiety, palpitations, SVT and syncope.  She was first seen by Dr. Junnie Olives in November 2020 for.  She reported presyncope and syncope since the age of 59.  Her syncopal episodes were always preceded by blurred vision, nausea and feeling flushed.  Laying flat aborts the progression of symptoms.  She went to the emergency department in February of this year with palpitations.  Her heart rate was in the 160s.  The providers were planning on giving adenosine  but she converted back to sinus rhythm prior to it administration.  She was discharged from the ER.      Their past medical, social and family history was reviewed.   ROS:   Please see the history of present illness.    All other systems reviewed and are negative.  EKGs/Labs/Other Studies Reviewed:    The following studies were reviewed today:  July 02, 2023 ZIO monitor personally reviewed Heart rate 40-1 90, average 78 1 SVT lasting 81 seconds with a max rate of 190 bpm.  Wenckebach present.  Rare supraventricular and ventricular ectopy.  September 22, 2023 EKG shows sinus rhythm, PR 208 ms, QRS duration 80 ms, no preexcitation      Physical Exam:    VS:  There were no vitals taken for this visit.    Wt Readings from Last 3 Encounters:  09/22/23 (!) 318 lb 6.4 oz (144.4 kg)  09/11/23 290 lb (131.5 kg)  08/22/23 (!) 312 lb 9.6 oz (141.8 kg)     GEN: no distress CARD: RRR, No  MRG RESP: No IWOB. CTAB.        ASSESSMENT AND PLAN:    No diagnosis found.  #SVT Symptomatic.  Previously led to ER visit. Heart monitor captured 1 episode lasting 81 seconds.  Morphology appears consistent with AVNRT although I cannot completely exclude other mechanisms of SVT including AVRT or atrial tachycardia.  She is currently prescribed Toprol -XL 25 mg by mouth once daily.  We discussed treatment options for her SVT today including medical therapy and EP study with ablation.  Her BMI puts her at significant increased risk for complication during catheter ablation.  Like to start with medical therapy first.  If she fails medical therapy, can reconsider catheter-based techniques.  I also discussed the possibility that weight loss alone may improve her arrhythmia significantly.     Signed, Leanora Prophet. Marven Slimmer, MD, [facility], [facility] 11/15/2023 10:01 AM    Electrophysiology [facility]

## 2023-12-28 ENCOUNTER — Telehealth: Payer: Self-pay | Admitting: Podiatry

## 2023-12-28 NOTE — Telephone Encounter (Signed)
 The patient called to check on the status and results of a CT scan that was done a few months ago. They would also would like to know what the next steps should be.

## 2024-01-01 ENCOUNTER — Telehealth: Payer: Self-pay | Admitting: Cardiology

## 2024-01-01 NOTE — Telephone Encounter (Signed)
 Returned mail: called 01/01/2024 @ 11:22. Need address verification.

## 2024-01-29 NOTE — Progress Notes (Deleted)
  Electrophysiology Office Note:    Date:  01/29/2024   ID:  Maria Espinoza, DOB August 31, 1990, MRN [id number]  [facility] Cardiologist:  Redell Cave, MD  [facility] Electrophysiologist:  OLE ONEIDA HOLTS, MD   Referring MD: Loistine Sober, NP   Chief Complaint: Palpitations  History of Present Illness:    Ms. Maria Espinoza is a 33 year old woman who I am seeing today for an evaluation of palpitations at the request of Maria Espinoza born. The patient last saw Maria on September 22, 2023.  That appointment came after an ER visit on February 24 for SVT.  At the appointment with Maria she reported more frequent SVT episodes.  She also reports syncope with the most recent episode occurring 3 months prior to her appointment with Maria.  When she was seen in the emergency department in February her heart rates were in the 160s.  Adenosine  was planned but did not have have to be given because she converted to sinus rhythm before medication ministration.  Metoprolol  was started and was continued by Maria at the most recent appointment.     Their past medical, social and family history was reviewed.   ROS:   Please see the history of present illness.    All other systems reviewed and are negative.  EKGs/Labs/Other Studies Reviewed:    The following studies were reviewed today:  September 22, 2023 EKG shows sinus rhythm.  No preexcitation.  September 11, 2023 EKG shows narrow complex tachycardia with ventricular rate of 170.  Small notching at the terminal portion of QRS suggest AVNRT.  June 27, 2023 echo EF 60-65 RV normal No significant valvular disease       Physical Exam:    VS:  There were no vitals taken for this visit.    Wt Readings from Last 3 Encounters:  09/22/23 (!) 318 lb 6.4 oz (144.4 kg)  09/11/23 290 lb (131.5 kg)  08/22/23 (!) 312 lb 9.6 oz (141.8 kg)     GEN: no distress CARD: RRR, No MRG RESP: No IWOB. CTAB.        ASSESSMENT AND PLAN:     No diagnosis found.   #SVT **Ablation candidate?*  Therapeutic strategies for supraventricular tachycardia including medicine and ablation were discussed in detail with the patient today. Risk, benefits, and alternatives to EP study and radiofrequency ablation were also discussed in detail today. These risks include but are not limited to stroke, bleeding, vascular damage, tamponade, perforation, damage to the heart and other structures, AV block requiring pacemaker, worsening renal function, and death. The patient understands these risk and wishes to proceed.  We will therefore proceed with catheter ablation at the next available time.  Hold metoprolol  for 5 days prior to the procedure.     Signed, OLE ONEIDA. HOLTS, MD, [facility], [facility] 01/29/2024 1:47 PM    Electrophysiology [facility]

## 2024-01-31 ENCOUNTER — Ambulatory Visit: Admitting: Cardiology

## 2024-01-31 DIAGNOSIS — I471 Supraventricular tachycardia, unspecified: Secondary | ICD-10-CM

## 2024-02-06 ENCOUNTER — Ambulatory Visit: Admitting: Podiatry

## 2024-02-09 ENCOUNTER — Ambulatory Visit (INDEPENDENT_AMBULATORY_CARE_PROVIDER_SITE_OTHER)

## 2024-02-09 ENCOUNTER — Encounter: Payer: Self-pay | Admitting: Podiatry

## 2024-02-09 ENCOUNTER — Ambulatory Visit (INDEPENDENT_AMBULATORY_CARE_PROVIDER_SITE_OTHER): Admitting: Podiatry

## 2024-02-09 VITALS — Ht 63.0 in | Wt 318.4 lb

## 2024-02-09 DIAGNOSIS — Z9889 Other specified postprocedural states: Secondary | ICD-10-CM

## 2024-02-09 DIAGNOSIS — S92301K Fracture of unspecified metatarsal bone(s), right foot, subsequent encounter for fracture with nonunion: Secondary | ICD-10-CM | POA: Diagnosis not present

## 2024-02-09 DIAGNOSIS — M7741 Metatarsalgia, right foot: Secondary | ICD-10-CM

## 2024-02-09 NOTE — Progress Notes (Signed)
 Chief Complaint  Patient presents with   Foot Pain    Pt is here to f/u on right foot, had a CT scar in February would like to know the results, states she still having pain in the foot and some swelling, she is using compression sleeve.    Subjective:  Patient presents today status post fourth metatarsal osteotomy/lengthening with bone allograft and internal fixation. DOS: 05/27/2022.  Patient continues to have pain and tenderness associated to the foot on a daily basis.  Pain with ambulation.  She has tried the external bone stimulator which has not improved her pain.  Last visit CT scan was ordered.  She presents to review the CT scan results and discuss further treatment options.  Past Medical History:  Diagnosis Date   Anemia    Anxiety    Bipolar depression (HCC)    Depression    Difficult intubation    x 1 with foot surgery   Dizziness    Migraines    Nausea    PSVT (paroxysmal supraventricular tachycardia) (HCC)    a.05/2023 Zio: Predominant sinus rhythm (40-190, average 78). 1 run SVT x 45m 21s, max rate 190-->assoc w/ Ss.   Rectal bleeding    Syncope and collapse    a. 06/2023 Echo: EF 60-65%, no rwma, nl RV fxn, RVSP 32.5 mmHg. Mild MR, mild-mod TR; b.05/2023 Zio: Predominant sinus rhythm (40-190, average 78). 1 run SVT x 59m 21s, max rate 190-->assoc w/ Ss.    Past Surgical History:  Procedure Laterality Date   HARDWARE REMOVAL Left 05/14/2021   Procedure: LEFT FOOT HARDWARE REMOVAL;  Surgeon: Janit Thresa HERO, DPM;  Location: [facility];  Service: Podiatry;  Laterality: Left;   HARDWARE REMOVAL Right 07/23/2021   Procedure: HARDWARE REMOVAL;  Surgeon: Janit Thresa HERO, DPM;  Location: [facility];  Service: Podiatry;  Laterality: Right;   METATARSAL OSTEOTOMY Left 01/15/2021   Procedure: METATARSAL OSTEOTOMY;  Surgeon: Janit Thresa HERO, DPM;  Location: [facility];  Service: Podiatry;  Laterality: Left;   METATARSAL OSTEOTOMY Right 05/28/2021   Procedure: METATARSAL OSTEOTOMY  FOURTH TOE RIGHT FOOT;  Surgeon: Janit Thresa HERO, DPM;  Location: [facility];  Service: Podiatry;  Laterality: Right;   METATARSAL OSTEOTOMY Right 05/27/2022   Procedure: METATARSAL OSTEOTOMY FOURTH TOE;  Surgeon: Janit Thresa HERO, DPM;  Location: [facility];  Service: Podiatry;  Laterality: Right;   TONSILLECTOMY     age 33   WISDOM TOOTH EXTRACTION      Allergies  Allergen Reactions   Latex Rash    Objective: Physical Exam General: The patient is alert and oriented x3 in no acute distress.  Dermatology: Skin is warm dry and supple bilateral.  Incision healed.  No hypertrophic keloid scar formation noted  Vascular: Palpable pedal pulses bilaterally.  Today there is some edema localized around the surgical site.  No erythema.  Neurological: Grossly intact via light touch  Musculoskeletal Exam: There continues to be tenderness and pain throughout palpation along the surgical site and fourth metatarsal of the right foot.  Radiographic exam RT foot 08/18/2023 Essentially unchanged.  Orthopedic hardware is stable.  No significant improvement of the bone allograft.  Radiolucency noted throughout the bone allograft portion of the fourth metatarsal.  Again, degenerative changes noted to the fourth MTP  CT FOOT RIGHT WO CONTRAST 08/29/2023 IMPRESSION: 1. Prior fourth mid metatarsal osteotomy transfixed with a dorsal side plate and multiple interlocking screws with a 10 x 10 x 14 mm defect at the osteotomy site  with no significant bone bridging of the proximal distal aspect of the metatarsal. Arthrodesis of the fourth MTP joint. 2. No acute osseous injury of the right foot.  Assessment: 1. s/p fourth metatarsal osteotomy with bone allograft lengthening and internal fixation RT foot. DOS: 05/27/2022 2.  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  Imaging reviewed -Unfortunately the patient has failed conservative treatment postoperatively and  bone stimulator.  She continues to have pain and tenderness to the right foot on a daily basis.  Today we discussed surgery to correct for the patient's nonunion of the metatarsal osteotomy.  Surgery would include removal of the hardware with revision of the arthrodesis site and harvesting of calcaneal autograft to place along the metatarsal void.  The procedure was explained in length in detail with the patient.  No guarantees were expressed or implied.  Postoperative recovery course was also explained.  She understands that she will be nonweightbearing minimum 8 weeks postoperatively in a cam boot.  Explained that calcaneal autograft will be the best potential for the patient to create union and arthrodesis across the osteotomy site of the fourth metatarsal.  Risk benefits advantages disadvantages the procedure were explained in detail to the patient.  No guarantees were expressed or implied.  All patient questions were answered.  After discussion with the patient she would like to proceed with surgery -Authorization for surgery was initiated today.  Surgery will consist of removal of hardware right fourth metatarsal.  Harvesting of calcaneal bone autograft right.  Revision of nonunion fourth metatarsal with internal fixation right. -Return to clinic 1 week postop  Thresa EMERSON Sar, DPM [facility]  Dr. Thresa EMERSON Sar, DPM    [address] [place], [place] [address]                Office [phone]  Fax [phone]

## 2024-03-07 ENCOUNTER — Ambulatory Visit: Admitting: Family Medicine

## 2024-03-13 ENCOUNTER — Ambulatory Visit: Admitting: Family Medicine

## 2024-03-22 ENCOUNTER — Ambulatory Visit: Admitting: Family Medicine

## 2024-03-27 NOTE — Progress Notes (Deleted)
 Cardiology Clinic Note   Date: 03/27/2024 ID: Makalyn Lennox, DOB March 03, 1991, MRN [id number]  Primary Cardiologist:  Redell Cave, MD  Chief Complaint   Laityn Bensen is a 33 y.o. female who presents to the clinic today for ***  Patient Profile   Maria Espinoza is followed by *** for the history outlined below.       Past medical history significant for: Syncope. Echo 06/27/2023: EF 60 to 65%.  No RWMA.  Normal diastolic parameters.  Normal RV size/function.  Normal PA pressure, RVSP 32.5 mmHg.  Mild MR.  Mild to moderate TR.  Mild AI without AS. Palpitations/SVT. 14-day ZIO 06/30/2023: HR 40 to 190 bpm, average 78 bpm.  Predominantly sinus rhythm.  1 run of SVT lasting 1 minute 21 seconds with max rate 190 bpm, average 171 bpm.  Second-degree AV block was present.  SVT detected within +/- 45 seconds of symptomatic patient event.  Rare ectopy.  No A-fib/a-flutter.  No other significant arrhythmias. Anxiety. Bipolar disorder. Tobacco abuse.  In summary, patient was first evaluated by Dr. Cave in November 2024 for syncope.  Patient reported presyncopal and syncopal events occurring since age 33 sometimes associated with palpitations.  Events occurred exclusively while standing or on a few occasions while having a bowel movement.  Episodes are preceded by blurred vision, nausea and flushed feeling lasting 1 to 2 minutes followed by syncope if she is unable to lie down.  Echo demonstrated normal LV/RV function as detailed above.  2-week ZIO demonstrated 1 run of SVT as detailed above.  Patient was last seen in the office by Medford Meager, NP on 08/02/2023 for routine follow-up.  She was doing well at that time with no presyncopal or syncopal events.  She was referred to pulmonology for sleep study.  No other changes were made.  Patient presented to the ED on 09/11/2023 with report of palpitations described as heart racing, tightness in chest and lightheadedness.  She took a dose of  metoprolol  with no relief EKG demonstrated SVT, HR in the 160s.  Plan was to administer adenosine  however patient converted to sinus rhythm spontaneously.  Labs demonstrated WBC 10.9, hemoglobin 13.3, sodium 138, potassium 4, creatinine 0.95, BUN 14.  Troponin 55.  Patient maintained sinus rhythm throughout her stay in the ED.  Dr. Nancey reviewed EKG strips.  He felt slight increase in troponin secondary to elevated heart rate and serial troponins not indicated.  Patient was discharged home.  Patient was last seen in the office by me on 09/22/2023 for hospital follow-up.  She reported 1 additional episode of SVT lasting 15 minutes before resolving.  She reported increased episodes of palpitations with at least 1-2 brief episodes lasting under a minute each day.  She reported last syncopal event 3 months prior.  She was provided with Rx for metoprolol  to tartrate for breakthrough palpitations lasting > 10 minutes.  She was referred to EP but does not appear to have had a visit.     History of Present Illness    Today, patient ***  ***  ROS: All other systems reviewed and are otherwise negative except as noted in History of Present Illness.  EKGs/Labs Reviewed        09/11/2023: BUN 14; Creatinine, Ser 0.95; Potassium 4.0; Sodium 138   09/11/2023: Hemoglobin 13.3; WBC 10.9   08/02/2023: TSH 2.010   No results found for requested labs within last 365 days.  ***  Risk Assessment/Calculations    {Does this patient have ATRIAL  FIBRILLATION?:[phone]} No BP recorded.  {Refresh Note OR Click here to enter BP  :1}***   STOP-Bang Score:  4  { Consider Dx Sleep Disordered Breathing or Sleep Apnea  ICD G47.33          :1}     Physical Exam    VS:  There were no vitals taken for this visit. , BMI There is no height or weight on file to calculate BMI.  GEN: Well nourished, well developed, in no acute distress. Neck: No JVD or carotid bruits. Cardiac: *** RRR. *** No murmur. No rubs or  gallops.   Respiratory:  Respirations regular and unlabored. Clear to auscultation without rales, wheezing or rhonchi. GI: Soft, nontender, nondistended. Extremities: Radials/DP/PT 2+ and equal bilaterally. No clubbing or cyanosis. No edema ***  Skin: Warm and dry, no rash. Neuro: Strength intact.  Assessment & Plan   ***  Disposition: ***     {Are you ordering a CV Procedure (e.g. stress test, cath, DCCV, TEE, etc)?   Press F2        :[id number]}   Signed, Barnie HERO. Kiasha Bellin, DNP, NP-C

## 2024-03-29 ENCOUNTER — Ambulatory Visit: Admitting: Student

## 2024-03-29 ENCOUNTER — Ambulatory Visit: Admitting: Family Medicine

## 2024-04-03 ENCOUNTER — Ambulatory Visit: Admitting: Family Medicine

## 2024-04-03 ENCOUNTER — Ambulatory Visit: Attending: Cardiology | Admitting: Cardiology

## 2024-04-03 NOTE — Progress Notes (Deleted)
  Electrophysiology Office Note:    Date:  04/03/2024   ID:  Maria Maria, DOB 1991-06-12, MRN [id number]  [facility] Cardiologist:  Redell Cave, MD  [facility] Electrophysiologist:  OLE ONEIDA HOLTS, MD   Referring MD: Loistine Sober, NP   Chief Complaint: SVT  History of Present Illness:    Maria Maria is a 33 year old woman who I am seeing today for an evaluation of SVT at the request of Maria Maria.  The patient Maria Maria September 22, 2023.  She was seen in the emergency department in February of this year with an SVT that lasted 15 minutes.  At that appointment she reported the episodes becoming more frequent.  She is on metoprolol  succinate daily with as needed metoprolol  to tartrate.      Their past medical, social and family history was reviewed.   ROS:   Please see the history of present illness.    All other systems reviewed and are negative.  EKGs/Labs/Other Studies Reviewed:    The following studies were reviewed today:  July 02, 2023 ZIO monitor 1 episode of SVT lasting 81 seconds with an max heart rate of 190 bpm Sudden onset/offset, regular tachycardia.  Narrow complex.  September 22, 2023 EKG shows sinus rhythm.  Narrow complex.  No preexcitation.        Physical Exam:    VS:  There were no vitals taken for this visit.    Wt Readings from Last 3 Encounters:  02/09/24 (!) 318 lb 6.4 oz (144.4 kg)  09/22/23 (!) 318 lb 6.4 oz (144.4 kg)  09/11/23 290 lb (131.5 kg)     GEN: no distress CARD: RRR, No MRG RESP: No IWOB. CTAB.        ASSESSMENT AND PLAN:    No diagnosis found.   #SVT Symptomatic Currently prescribed metoprolol  succinate daily with metoprolol  tartrate on an as-needed basis ***Increase metoprolol  for now. Continue weight loss efforts, refer to healthy weight clinic***     Signed, OLE ONEIDA. HOLTS, MD, [facility], [facility] 04/03/2024 7:36 AM    Electrophysiology [facility]

## 2024-04-05 ENCOUNTER — Telehealth: Payer: Self-pay | Admitting: Podiatry

## 2024-04-05 NOTE — Telephone Encounter (Signed)
 DOS- 05/03/2024  REVISION NONUNION RT 4TH METATARSAL- 71677 CALCANEAL AUTOGRAFT RT- 20902  DEVOTED EFFECTIVE DATE- 03/18/2024  DEDUCTIBLE- N/A OOP- $8000 REMAINING- $8000  PER DEVOTED PORTAL, NO PRIOR AUTHS ARE REQUIRED FOR CPT CODES 71677 AND 20902. DOCUMENTATION ATTACHED TO SURGICAL CONSENT PACKET.

## 2024-04-09 ENCOUNTER — Ambulatory Visit: Admitting: Family Medicine

## 2024-04-10 ENCOUNTER — Telehealth: Payer: Self-pay | Admitting: Sleep Medicine

## 2024-04-10 NOTE — Telephone Encounter (Signed)
 Dr. Jess placed an order for Cpap setup for this patient. The order was sent to [facility]. We received a note from [facility] they were voiding the order because they had been unable to successfully schedule services with the patient despite multiple attempts. I called and spoke with Mrs. Eisenstein she stated she had been trying to get in touch with [facility]. I confirmed that Mrs. Clendenning did have the correct phone number to call. I did call Tammy with [facility] and she stated they would be calling the patient today. Tammy did mention that had been having phone issues

## 2024-04-17 ENCOUNTER — Ambulatory Visit: Admitting: Family Medicine

## 2024-04-18 ENCOUNTER — Ambulatory Visit (INDEPENDENT_AMBULATORY_CARE_PROVIDER_SITE_OTHER): Admitting: Family Medicine

## 2024-04-18 VITALS — BP 132/84 | HR 88 | Resp 16 | Ht 63.0 in | Wt 310.0 lb

## 2024-04-18 DIAGNOSIS — F3177 Bipolar disorder, in partial remission, most recent episode mixed: Secondary | ICD-10-CM

## 2024-04-18 DIAGNOSIS — M21961 Unspecified acquired deformity of right lower leg: Secondary | ICD-10-CM | POA: Diagnosis not present

## 2024-04-18 DIAGNOSIS — M21962 Unspecified acquired deformity of left lower leg: Secondary | ICD-10-CM

## 2024-04-18 DIAGNOSIS — Z6841 Body Mass Index (BMI) 40.0 and over, adult: Secondary | ICD-10-CM

## 2024-04-18 DIAGNOSIS — Z7689 Persons encountering health services in other specified circumstances: Secondary | ICD-10-CM

## 2024-04-18 DIAGNOSIS — G4733 Obstructive sleep apnea (adult) (pediatric): Secondary | ICD-10-CM

## 2024-04-18 DIAGNOSIS — I471 Supraventricular tachycardia, unspecified: Secondary | ICD-10-CM

## 2024-04-18 DIAGNOSIS — F418 Other specified anxiety disorders: Secondary | ICD-10-CM

## 2024-04-18 MED ORDER — METOPROLOL SUCCINATE ER 25 MG PO TB24: 25.0000 mg | ORAL_TABLET | Freq: Every day | ORAL | 1 refills | Status: AC

## 2024-04-18 MED ORDER — METOPROLOL TARTRATE 25 MG PO TABS: ORAL_TABLET | ORAL | 3 refills | Status: AC

## 2024-04-18 NOTE — Progress Notes (Signed)
 New Patient Office Visit  Subjective   Patient ID: Maria Espinoza, female    DOB: [birth year]  Age: 33 y.o. MRN: [id number]  CC:  Chief Complaint  Patient presents with   Establish Care    Will need Med clearance for upcoming foot surgery 05/03/24- is coming back to be seen for clearance-   Discussed the use of AI scribe software for clinical note transcription with the patient, who gave verbal consent to proceed.  History of Present Illness   Maria Espinoza is a 33 year old female who presents to establish with [facility] at [facility]. She is currently followed by psychiatry, podiatry, cardiology, and OBGYN. Her former PCP is Dr. Britta.   She has a history of heart problems characterized by episodes of tachycardia, with heart rates reaching up to 160 bpm, leading to syncope or hospital visits. She reports that she has been instructed to take metoprolol  as one dose daily and another as needed during episodes. She monitors her heart rate at home using a finger pulse oximeter. She missed a cardiology appointment in September and has rescheduled for December.  She has been diagnosed with sleep apnea following a sleep study. She is awaiting approval for a CPAP machine to help manage the condition.  She has undergone multiple foot surgeries since 2022, with the most recent being her fifth surgery. She has screws and rods in her foot, and she reports that her surgeons have discussed transferring bone from her ankle to her foot in a future procedure. 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 She receives disability benefits for this condition and now works from home.  Her psychiatric history includes PTSD and depression, for which she takes prazosin and Vraylar, respectively. She is managed by [facility] in [place] and has been under the care of the same psychiatrist for years. She is awaiting a  new medication for depression, which she has not yet picked up.  She is due for a Pap smear and wishes to follow up on an STD test conducted three months ago. She usually sees her OB/GYN for these services but has been informed that they can be done at the current practice.      05/02/2024    3:08 PM 04/18/2024   10:49 AM  GAD 7 : Generalized Anxiety Score  Nervous, Anxious, on Edge 2 0  Control/stop worrying 2 0  Worry too much - different things 2 0  Trouble relaxing 2 0  Restless 2 0  Easily annoyed or irritable 2 0  Afraid - awful might happen 2 0  Total GAD 7 Score 14 0  Anxiety Difficulty Somewhat difficult Not difficult at all      05/02/2024    3:08 PM 04/18/2024   10:49 AM 04/25/2022   12:14 PM  PHQ9 SCORE ONLY  PHQ-9 Total Score 18 0 0      Data saved with a previous flowsheet row definition    Outpatient Encounter Medications as of 04/18/2024  Medication Sig   mirtazapine (REMERON) 7.5 MG tablet Take 7.5 mg by mouth at bedtime.   Multiple Vitamin (MULTIVITAMIN) tablet Take 1 tablet by mouth daily.   prazosin (MINIPRESS) 1 MG capsule Take 1-2 mg by mouth at bedtime.   terbinafine  (LAMISIL ) 250 MG tablet Take 1 tablet (250 mg total) by mouth daily.   VRAYLAR 4.5 MG CAPS Take 1 capsule by mouth daily.   [  DISCONTINUED] metoprolol  succinate (TOPROL -XL) 25 MG 24 hr tablet Take 1 tablet (25 mg total) by mouth daily.   [DISCONTINUED] Vitamin D , Ergocalciferol , (DRISDOL ) 1.25 MG (50000 UNIT) CAPS capsule Take 1 capsule (50,000 Units total) by mouth every 7 (seven) days.   clotrimazole -betamethasone  (LOTRISONE ) cream Apply 1 Application topically 2 (two) times daily.   metoprolol  succinate (TOPROL -XL) 25 MG 24 hr tablet Take 1 tablet (25 mg total) by mouth daily.   metoprolol  tartrate (LOPRESSOR ) 25 MG tablet Take 1/2 tablet by mouth as needed for palpitations, may take up to twice daily.   [DISCONTINUED] metoprolol  tartrate (LOPRESSOR ) 25 MG tablet Take 1/2 tablet by mouth as  needed for palpitations, may take up to twice daily.   [DISCONTINUED] sertraline  (ZOLOFT ) 100 MG tablet Take 100 mg by mouth daily. (Patient not taking: Reported on 04/18/2024)   No facility-administered encounter medications on file as of 04/18/2024.    Patient Active Problem List   Diagnosis Date Noted   Genetic testing 10/05/2022   Establishing care with new doctor, encounter for 01/14/2021   Social anxiety disorder 10/03/2018   Severe recurrent major depression without psychotic features (HCC) 10/03/2018   Intentional doxepin overdose (HCC) 10/01/2018   Bipolar disorder, unspecified (HCC) 07/24/2018   Depression with anxiety 04/25/2018   Migraines 04/25/2018   Morbid obesity with BMI of 50.0-59.9, adult (HCC) 04/25/2018   Past Medical History:  Diagnosis Date   Anemia    Anxiety    Bipolar depression (HCC)    Depression    Difficult intubation    x 1 with foot surgery   Dizziness    Migraines    Myocardial infarction (HCC)    Nausea    PSVT (paroxysmal supraventricular tachycardia)    a.05/2023 Zio: Predominant sinus rhythm (40-190, average 78). 1 run SVT x 62m 21s, max rate 190-->assoc w/ Ss.   Rectal bleeding    Sleep apnea    Syncope and collapse    a. 06/2023 Echo: EF 60-65%, no rwma, nl RV fxn, RVSP 32.5 mmHg. Mild MR, mild-mod TR; b.05/2023 Zio: Predominant sinus rhythm (40-190, average 78). 1 run SVT x 25m 21s, max rate 190-->assoc w/ Ss.   Past Surgical History:  Procedure Laterality Date   HARDWARE REMOVAL Left 05/14/2021   Procedure: LEFT FOOT HARDWARE REMOVAL;  Surgeon: Janit Thresa HERO, DPM;  Location: [facility];  Service: Podiatry;  Laterality: Left;   HARDWARE REMOVAL Right 07/23/2021   Procedure: HARDWARE REMOVAL;  Surgeon: Janit Thresa HERO, DPM;  Location: [facility];  Service: Podiatry;  Laterality: Right;   METATARSAL OSTEOTOMY Left 01/15/2021   Procedure: METATARSAL OSTEOTOMY;  Surgeon: Janit Thresa HERO, DPM;  Location: [facility];  Service: Podiatry;  Laterality:  Left;   METATARSAL OSTEOTOMY Right 05/28/2021   Procedure: METATARSAL OSTEOTOMY FOURTH TOE RIGHT FOOT;  Surgeon: Janit Thresa HERO, DPM;  Location: [facility];  Service: Podiatry;  Laterality: Right;   METATARSAL OSTEOTOMY Right 05/27/2022   Procedure: METATARSAL OSTEOTOMY FOURTH TOE;  Surgeon: Janit Thresa HERO, DPM;  Location: [facility];  Service: Podiatry;  Laterality: Right;   TONSILLECTOMY     age 31   WISDOM TOOTH EXTRACTION     Family History  Problem Relation Age of Onset   Migraines Father    Heart disease Maternal Grandmother    Depression Maternal Grandmother    Hypertension Maternal Grandmother    Breast cancer Paternal Grandfather 45   Breast cancer Other        then bone cancer/metastatic   Social History  Socioeconomic History   Marital status: Single    Spouse name: Not on file   Number of children: 3   Years of education: Not on file   Highest education level: GED or equivalent  Occupational History   Not on file  Tobacco Use   Smoking status: Some Days    Current packs/day: 0.50    Average packs/day: 0.5 packs/day for 3.0 years (1.5 ttl pk-yrs)    Types: Cigarettes    Passive exposure: Current   Smokeless tobacco: Never   Tobacco comments:    0.5 PPD - khj 08/22/2023  Vaping Use   Vaping status: Never Used  Substance and Sexual Activity   Alcohol use: Yes    Comment: occasional   Drug use: Never   Sexual activity: Yes    Partners: Male    Birth control/protection: Abstinence, None  Other Topics Concern   Not on file  Social History Narrative   Not on file   Social Drivers of Health   Financial Resource Strain: Medium Risk (04/18/2024)   Overall Financial Resource Strain (CARDIA)    Difficulty of Paying Living Expenses: Somewhat hard  Food Insecurity: Food Insecurity Present (04/18/2024)   Hunger Vital Sign    Worried About Running Out of Food in the Last Year: Sometimes true    Ran Out of Food in the Last Year: Sometimes true  Transportation Needs:  No Transportation Needs (04/18/2024)   PRAPARE - Administrator, Civil Service (Medical): No    Lack of Transportation (Non-Medical): No  Physical Activity: Insufficiently Active (04/18/2024)   Exercise Vital Sign    Days of Exercise per Week: 2 days    Minutes of Exercise per Session: 20 min  Stress: Stress Concern Present (04/18/2024)   [employer] of Occupational Health - Occupational Stress Questionnaire    Feeling of Stress: Very much  Social Connections: Socially Isolated (04/18/2024)   Social Connection and Isolation Panel    Frequency of Communication with Friends and Family: Once a week    Frequency of Social Gatherings with Friends and Family: Never    Attends Religious Services: Never    Database Administrator or Organizations: No    Attends Engineer, Structural: Not on file    Marital Status: Never married  Intimate Partner Violence: Not At Risk (10/14/2021)   Humiliation, Afraid, Rape, and Kick questionnaire    Fear of Current or Ex-Partner: No    Emotionally Abused: No    Physically Abused: No    Sexually Abused: No   Outpatient Medications Prior to Visit  Medication Sig Dispense Refill   mirtazapine (REMERON) 7.5 MG tablet Take 7.5 mg by mouth at bedtime.     Multiple Vitamin (MULTIVITAMIN) tablet Take 1 tablet by mouth daily.     prazosin (MINIPRESS) 1 MG capsule Take 1-2 mg by mouth at bedtime.     terbinafine  (LAMISIL ) 250 MG tablet Take 1 tablet (250 mg total) by mouth daily. 30 tablet 0   VRAYLAR 4.5 MG CAPS Take 1 capsule by mouth daily.     metoprolol  succinate (TOPROL -XL) 25 MG 24 hr tablet Take 1 tablet (25 mg total) by mouth daily. 90 tablet 1   Vitamin D , Ergocalciferol , (DRISDOL ) 1.25 MG (50000 UNIT) CAPS capsule Take 1 capsule (50,000 Units total) by mouth every 7 (seven) days. 12 capsule 1   clotrimazole -betamethasone  (LOTRISONE ) cream Apply 1 Application topically 2 (two) times daily. 45 g 1   metoprolol  tartrate (LOPRESSOR ) 25 MG  tablet Take 1/2 tablet by mouth as needed for palpitations, may take up to twice daily. 90 tablet 3   sertraline  (ZOLOFT ) 100 MG tablet Take 100 mg by mouth daily. (Patient not taking: Reported on 04/18/2024)     No facility-administered medications prior to visit.   Allergies  Allergen Reactions   Latex Rash   ROS: see HPI    Objective   Today's Vitals   04/18/24 1025  BP: 132/84  Pulse: 88  Resp: 16  SpO2: 99%  Weight: (!) 310 lb (140.6 kg)  Height: 5' 3 (1.6 m)  PainSc: 0-No pain   GENERAL: Well-appearing, in NAD. Well nourished.  SKIN: Pink, warm and dry. No rash, lesion, ulceration, or ecchymoses.  Head: Normocephalic. NECK: Trachea midline. Full ROM w/o pain or tenderness. No lymphadenopathy.  EARS: Tympanic membranes are intact, translucent without bulging and without drainage. Appropriate landmarks visualized.  EYES: Conjunctiva clear without exudates. EOMI, PERRL, no drainage present.  NOSE: Septum midline w/o deformity. Nares patent, mucosa pink and non-inflamed w/o drainage. No sinus tenderness.  THROAT: Uvula midline. Oropharynx clear. Tonsils non-inflamed without exudate. Mucous membranes pink and moist.  RESPIRATORY: Chest wall symmetrical. Respirations even and non-labored. Breath sounds clear to auscultation bilaterally.  CARDIAC: S1, S2 present, regular rate and rhythm without murmur or gallops. Peripheral pulses 2+ bilaterally.  MSK: Muscle tone and strength appropriate for age. Joints w/o tenderness, redness, or swelling.  EXTREMITIES: Without clubbing, cyanosis, or edema.  NEUROLOGIC: No motor or sensory deficits. Steady, even gait. C2-C12 intact.  PSYCH/MENTAL STATUS: Alert, oriented x 3. Cooperative, appropriate mood and affect.     Assessment & Plan:   1. Encounter to establish care (Primary) Patient is a 84- year-old female who presents today to establish care with primary care at [facility]. Reviewed the past medical history, family history, social  history, surgical history, medications and allergies today- updates made as indicated. Patient has concerns today about establishing care. Due for Pap smear and STD testing, previously managed by OB/GYN.    2. Morbid obesity with BMI of 50.0-59.9, adult (HCC) Discussed lifestyle modifications, including healthy diet and daily exercise as tolerated. Due to recent diagnosis of OSA and on CPAP therapy, she would benefit from Zepbound.   3. Depression with anxiety Followed by psychiatry. Denies SI/HI.   4. Bipolar disorder, in partial remission, most recent episode mixed (HCC) Followed by psychiatry. Currently taking Lexapro 10mg  daily, Remeron 7.5mg  at bedtime, prazosin 2mg  at bedtime, and Vraylar 4.5mg  daily.   5. Foot deformity, bilateral Foot deformity with multiple surgeries since 2022, including hardware insertion. Plan to graft bone from ankle to foot. Previous infection required hardware removal and surgical restart. 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 Continue podiatry follow-up for surgical management.  6. PSVT (paroxysmal supraventricular tachycardia) Episodes of tachycardia up to 160 bpm causing syncope. On metoprolol  XR 25 mg daily with additional dose as needed. Awaiting CPAP approval for sleep apnea, potentially affecting cardiac status. Missed cardiology appointment; rescheduled for December. Prescribed metoprolol  XR 25 mg daily with additional dose IR as needed.  7. OSA on CPAP See #2 Patient would benefit from Zepbound to help manage OSA. Will order medication.   Return in about 2 weeks (around 05/02/2024) for Physical with fasting labs.   Evalene Arts, FNP

## 2024-04-18 NOTE — Patient Instructions (Signed)

## 2024-04-25 ENCOUNTER — Ambulatory Visit

## 2024-04-25 ENCOUNTER — Encounter: Payer: Self-pay | Admitting: Family Medicine

## 2024-04-25 ENCOUNTER — Ambulatory Visit (INDEPENDENT_AMBULATORY_CARE_PROVIDER_SITE_OTHER): Admitting: Family Medicine

## 2024-04-25 ENCOUNTER — Telehealth: Payer: Self-pay

## 2024-04-25 VITALS — BP 118/79 | HR 65 | Temp 98.2°F | Resp 16 | Ht 63.0 in | Wt 310.0 lb

## 2024-04-25 DIAGNOSIS — Z0000 Encounter for general adult medical examination without abnormal findings: Secondary | ICD-10-CM

## 2024-04-25 DIAGNOSIS — Z113 Encounter for screening for infections with a predominantly sexual mode of transmission: Secondary | ICD-10-CM

## 2024-04-25 DIAGNOSIS — Z23 Encounter for immunization: Secondary | ICD-10-CM | POA: Diagnosis not present

## 2024-04-25 NOTE — Patient Instructions (Addendum)
 Health Maintenance Recommendations Screening Testing Mammogram Every 1 -2 years based on history and risk factors Starting at age 33 Please call to schedule: [facility]  [address], [place], [place] [address] Phone: [phone] [facility] [address], [place], [place] [address] Phone: [phone] Pap Smear Ages 21-39 every 3 years Ages 57-65 every 5 years with HPV testing More frequent testing may be required based on results and history Colon Cancer Screening Every 1-10 years based on test performed, risk factors, and history Starting at age 48 Bone Density Screening Every 2-10 years based on history Starting at age 85 for women Recommendations for men differ based on medication usage, history, and risk factors AAA Screening One time ultrasound Men 53-61 years old who have every smoked Lung Cancer Screening Low Dose Lung CT every 12 months Age 37-80 years with a 30 pack-year smoking history who still smoke or who have quit within the last 15 years   Screening Labs Routine  Labs: Complete Blood Count (CBC), Complete Metabolic Panel (CMP), Cholesterol (Lipid Panel) Every 6-12 months based on history and medications May be recommended more frequently based on current conditions or previous results Hemoglobin A1c Lab Every 3-12 months based on history and previous results Starting at age 42 or earlier with diagnosis of diabetes, high cholesterol, BMI >26, and/or risk factors Frequent monitoring for patients with diabetes to ensure blood sugar control Thyroid Panel (TSH w/ T3 & T4) Every 6 months based on history, symptoms, and risk factors May be repeated more often if on medication HIV One time testing for all patients 12 and older May be repeated more frequently for patients with increased risk factors or exposure Hepatitis C One time testing for all patients 64 and older May be repeated more frequently for patients with increased risk factors or  exposure Gonorrhea, Chlamydia Every 12 months for all sexually active persons 13-24 years Additional monitoring may be recommended for those who are considered high risk or who have symptoms PSA Men 58-57 years old with risk factors Additional screening may be recommended from age 64-69 based on risk factors, symptoms, and history   Vaccine Recommendations Tetanus Booster All adults every 10 years Flu Vaccine All patients 6 months and older every year COVID Vaccine All patients 12 years and older Initial dosing with booster May recommend additional booster based on age and health history HPV Vaccine 2 doses all patients age 84-26 Dosing may be considered for patients over 26 Shingles Vaccine (Shingrix) 2 doses all adults 55 years and older Pneumonia (Pneumovax 15) All adults 65 years and older May recommend earlier dosing based on health history Pneumonia (Prevnar 11) All adults 65 years and older Dosed 1 year after Pneumovax 23   Additional Screening, Testing, and Vaccinations may be recommended on an individualized basis based on family history, health history, risk factors, and/or exposure.  __________________________________________________________   Diet Recommendations for All Patients   I recommend that all patients maintain a diet low in saturated fats, carbohydrates, and cholesterol. While this can be challenging at first, it is not impossible and small changes can make big differences.  Things to try: Decreasing the amount of soda, sweet tea, and/or juice to one or less per day and replace with water While water is always the first choice, if you do not like water you may consider adding a water additive without sugar to improve the taste other sugar free drinks Replace potatoes with a brightly colored vegetable at dinner  Use healthy oils, such as canola oil or olive oil, instead of butter or hard margarine Limit your bread intake to two pieces or less a  day Replace regular pasta with low carb pasta options Bake, broil, or grill foods instead of frying Monitor portion sizes  Eat smaller, more frequent meals throughout the day instead of large meals   An important thing to remember is, if you love foods that are not great for your health, you don't have to give them up completely. Instead, allow these foods to be a reward when you have done well. Allowing yourself to still have special treats every once in a while is a nice way to tell yourself thank you for working hard to keep yourself healthy.    Also remember that every day is a new day. If you have a bad day and fall off the wagon, you can still climb right back up and keep moving along on your journey!   We have resources available to help you!  Some websites that may be helpful include: www.http://www.wall-moore.info/        Www.VeryWellFit.com _____________________________________________________________   Activity Recommendations for All Patients   I recommend that all adults get at least 20 minutes of moderate physical activity that elevates your heart rate at least 5 days out of the week.  Some examples include: Walking or jogging at a pace that allows you to carry on a conversation Cycling (stationary bike or outdoors) Water aerobics Yoga Weight lifting Dancing If physical limitations prevent you from putting stress on your joints, exercise in a pool or seated in a chair are excellent options.   Do determine your MAXIMUM heart rate for activity: YOUR AGE - 220 = MAX HeartRate    Remember! Do not push yourself too hard.  Start slowly and build up your pace, speed, weight, time in exercise, etc.  Allow your body to rest between exercise and get good sleep. You will need more water than normal when you are exerting yourself. Do not wait until you are thirsty to drink. Drink with a purpose of getting in at least 8, 8 ounce glasses of water a day plus more depending on how much you exercise  and sweat.      If you begin to develop dizziness, chest pain, abdominal pain, jaw pain, shortness of breath, headache, vision changes, lightheadedness, or other concerning symptoms, stop the activity and allow your body to rest. If your symptoms are severe, seek emergency evaluation immediately. If your symptoms are concerning, but not severe, please let us  know so that we can recommend further evaluation.     Dental list         Updated 8.18.22 These dentists all accept Medicaid.  The list is a courtesy and for your convenience. Estos dentistas aceptan Medicaid.  La lista es para su guam y es una cortesa.     [facility]     [phone] [address]  [address] [place] [address] Se habla espaol From 14 to 41 years old Parent may go with child only for cleaning Dorise Rouleau DDS     [phone] Clancy Hammersmith, DDS (Spanish speaking) [address] [place]  [address] Se habla espaol New patients 8 and under, established until 18y.o Parent may go with child if needed  Nikki armin Nikki DMD    [phone] [address] [place] [address] Se habla espaol [place] spoken From 65 years old Parent may go with child [facility]     [phone] [address] [place] [address] Se habla espaol, translation line, prefer for translator to be present  From 14 to 80 years old Ages 1-3y parents may go back 4+ go back by themselves parents can watch at "bay area"  [place] DDS  [phone] [facility]      [address]  [place] [address] Se habla espaol [place] spoken (preferred to bring translator) From teeth coming in to 51 years old Parent may go with child  [facility].     [phone] [address] [place] [address] Requires certification. Call for information. Requiere certificacin. Llame para informacin. Algunos dias se habla espaol  From birth to 20  years Parent possibly goes with child   Elza Hamburger DDS     [phone] [address]  [address] [place] [address] Se habla espaol From 4 to 18 years  Parent may NOT go with child  [facility] DDS     Camellia DOROTHA Cagey DDS  [phone] [address] [place] [address] Se habla espaol- phone interpreters Ages 10 years and older Parent may go with child- 15+ go back alone   Abran Kenner DDS    [phone] [address] [place] [address] Se habla espaol , 3 of their providers speak [place] From 18 months to 59 years old Parent may go with child [facility]  [phone] [address] [place] [address] Se habla espanol Interpretation for other languages Special needs children welcome Ages 27 and under  [facility]    [phone] [address] [place] [address] No se habla espaol From birth [facility]   [phone] Dr. Leita Lust [address] [place], [place] [address] From birth to 83 y- new patients 10 and under Special needs children welcome   [facility] [place] [phone] Se habla espaol [address] [place], [place] [address]  6 month to 19 years  [facility] [place] [phone] [address] [place], [place] [address]  Se habla espaol 6 months and up, highest age is 16-17 for new patients, will see established patients until 33 y.o Parents may go back with child

## 2024-04-25 NOTE — Telephone Encounter (Signed)
 Faxed clearance form to [facility] for upcoming surgery clearance. Also sent to scan center and copy placed in folder up front.

## 2024-04-25 NOTE — Progress Notes (Signed)
 Subjective:   Maria Espinoza 1991/07/13  04/25/2024   CC: Chief Complaint  Patient presents with   Annual Exam   HPI: Maria Espinoza is a 33 y.o. female who presents for a routine health maintenance exam. Fasting labs collected at time of visit.   HEALTH SCREENINGS: - Vision Screening: up to date - Dental Visits: plans to schedule  - Pap smear: up to date, 08/2022 HPV +; 12/27/2023 HPV -  - Breast Exam: up to date - STD Screening: Ordered today - Mammogram (40+): Not applicable  - Colonoscopy (45+): Not applicable  - Bone Density (65+ or under 65 with predisposing conditions): Not applicable  - Lung CA screening with low-dose CT:  Not applicable Adults age 5-80 who are current cigarette smokers or quit within the last 15 years. Must have 20 pack year history.   Depression and Anxiety Screen done today and results listed below:     04/18/2024   10:49 AM 04/25/2022   12:14 PM 05/11/2021   11:36 AM 11/24/2020    1:34 PM  Depression screen PHQ 2/9  Decreased Interest 0 0 0 0  Down, Depressed, Hopeless 0 0 0 0  PHQ - 2 Score 0 0 0 0  Altered sleeping 0 0    Tired, decreased energy 0 0    Change in appetite 0 0    Feeling bad or failure about yourself  0 0    Trouble concentrating 0 0    Moving slowly or fidgety/restless 0 0    Suicidal thoughts 0     PHQ-9 Score 0 0    Difficult doing work/chores Not difficult at all Not difficult at all        04/18/2024   10:49 AM  GAD 7 : Generalized Anxiety Score  Nervous, Anxious, on Edge 0  Control/stop worrying 0  Worry too much - different things 0  Trouble relaxing 0  Restless 0  Easily annoyed or irritable 0  Afraid - awful might happen 0  Total GAD 7 Score 0  Anxiety Difficulty Not difficult at all   IMMUNIZATIONS: - Tdap: Tetanus vaccination status reviewed: Td vaccination indicated and given today. - HPV: Up to date - Influenza: Refused - Pneumovax: Not applicable - Prevnar 20: done today  - Zostavax (50+): Not  applicable  Past medical history, surgical history, medications, allergies, family history and social history reviewed with patient today and changes made to appropriate areas of the chart.   Past Medical History:  Diagnosis Date   Anemia    Anxiety    Bipolar depression (HCC)    Depression    Difficult intubation    x 1 with foot surgery   Dizziness    Migraines    Myocardial infarction (HCC)    Nausea    PSVT (paroxysmal supraventricular tachycardia)    a.05/2023 Zio: Predominant sinus rhythm (40-190, average 78). 1 run SVT x 3m 21s, max rate 190-->assoc w/ Ss.   Rectal bleeding    Sleep apnea    Suicide attempt by drug overdose (HCC) 09/30/2018   a.) intentionally took a whole bottle of doxepin (# pills unknown)   Syncope and collapse    a. 06/2023 Echo: EF 60-65%, no rwma, nl RV fxn, RVSP 32.5 mmHg. Mild MR, mild-mod TR; b.05/2023 Zio: Predominant sinus rhythm (40-190, average 78). 1 run SVT x 11m 21s, max rate 190-->assoc w/ Ss.    Past Surgical History:  Procedure Laterality Date   HARDWARE REMOVAL Left 05/14/2021  Procedure: LEFT FOOT HARDWARE REMOVAL;  Surgeon: Janit Thresa HERO, DPM;  Location: [facility];  Service: Podiatry;  Laterality: Left;   HARDWARE REMOVAL Right 07/23/2021   Procedure: HARDWARE REMOVAL;  Surgeon: Janit Thresa HERO, DPM;  Location: [facility];  Service: Podiatry;  Laterality: Right;   METATARSAL OSTEOTOMY Left 01/15/2021   Procedure: METATARSAL OSTEOTOMY;  Surgeon: Janit Thresa HERO, DPM;  Location: [facility];  Service: Podiatry;  Laterality: Left;   METATARSAL OSTEOTOMY Right 05/28/2021   Procedure: METATARSAL OSTEOTOMY FOURTH TOE RIGHT FOOT;  Surgeon: Janit Thresa HERO, DPM;  Location: [facility];  Service: Podiatry;  Laterality: Right;   METATARSAL OSTEOTOMY Right 05/27/2022   Procedure: METATARSAL OSTEOTOMY FOURTH TOE;  Surgeon: Janit Thresa HERO, DPM;  Location: [facility];  Service: Podiatry;  Laterality: Right;   TONSILLECTOMY     age 52   WISDOM TOOTH  EXTRACTION      Current Outpatient Medications on File Prior to Visit  Medication Sig   clotrimazole -betamethasone  (LOTRISONE ) cream Apply 1 Application topically 2 (two) times daily.   escitalopram (LEXAPRO) 10 MG tablet Take 10 mg by mouth daily.   metoprolol  succinate (TOPROL -XL) 25 MG 24 hr tablet Take 1 tablet (25 mg total) by mouth daily.   metoprolol  tartrate (LOPRESSOR ) 25 MG tablet Take 1/2 tablet by mouth as needed for palpitations, may take up to twice daily.   mirtazapine (REMERON) 7.5 MG tablet Take 7.5 mg by mouth at bedtime.   Multiple Vitamin (MULTIVITAMIN) tablet Take 1 tablet by mouth daily.   prazosin (MINIPRESS) 1 MG capsule Take 2 mg by mouth at bedtime.   terbinafine  (LAMISIL ) 250 MG tablet Take 1 tablet (250 mg total) by mouth daily.   VRAYLAR 4.5 MG CAPS Take 4.5 mg by mouth daily.   No current facility-administered medications on file prior to visit.    Allergies  Allergen Reactions   Latex Rash   Social History   Socioeconomic History   Marital status: Single    Spouse name: Not on file   Number of children: 3   Years of education: Not on file   Highest education level: GED or equivalent  Occupational History   Not on file  Tobacco Use   Smoking status: Some Days    Current packs/day: 0.50    Average packs/day: 0.5 packs/day for 3.0 years (1.5 ttl pk-yrs)    Types: Cigarettes    Passive exposure: Current   Smokeless tobacco: Never   Tobacco comments:    0.5 PPD - khj 08/22/2023  Vaping Use   Vaping status: Never Used  Substance and Sexual Activity   Alcohol use: Yes    Comment: occasional   Drug use: Never   Sexual activity: Yes    Partners: Male    Birth control/protection: Abstinence, None  Other Topics Concern   Not on file  Social History Narrative   Not on file   Social Drivers of Health   Financial Resource Strain: Medium Risk (04/18/2024)   Overall Financial Resource Strain (CARDIA)    Difficulty of Paying Living Expenses:  Somewhat hard  Food Insecurity: Food Insecurity Present (04/18/2024)   Hunger Vital Sign    Worried About Running Out of Food in the Last Year: Sometimes true    Ran Out of Food in the Last Year: Sometimes true  Transportation Needs: No Transportation Needs (04/18/2024)   PRAPARE - Administrator, Civil Service (Medical): No    Lack of Transportation (Non-Medical): No  Physical Activity: Insufficiently Active (  04/18/2024)   Exercise Vital Sign    Days of Exercise per Week: 2 days    Minutes of Exercise per Session: 20 min  Stress: Stress Concern Present (04/18/2024)   [employer] of Occupational Health - Occupational Stress Questionnaire    Feeling of Stress: Very much  Social Connections: Socially Isolated (04/18/2024)   Social Connection and Isolation Panel    Frequency of Communication with Friends and Family: Once a week    Frequency of Social Gatherings with Friends and Family: Never    Attends Religious Services: Never    Database administrator or Organizations: No    Attends Engineer, structural: Not on file    Marital Status: Never married  Intimate Partner Violence: Not At Risk (10/14/2021)   Humiliation, Afraid, Rape, and Kick questionnaire    Fear of Current or Ex-Partner: No    Emotionally Abused: No    Physically Abused: No    Sexually Abused: No   Social History   Tobacco Use  Smoking Status Some Days   Current packs/day: 0.50   Average packs/day: 0.5 packs/day for 3.0 years (1.5 ttl pk-yrs)   Types: Cigarettes   Passive exposure: Current  Smokeless Tobacco Never  Tobacco Comments   0.5 PPD - khj 08/22/2023   Social History   Substance and Sexual Activity  Alcohol Use Yes   Comment: occasional    Family History  Problem Relation Age of Onset   Pneumonia Mother 29       cause of death   Migraines Father    Heart disease Maternal Grandmother    Depression Maternal Grandmother    Hypertension Maternal Grandmother    Breast cancer  Paternal Grandfather 74   Breast cancer Other        then bone cancer/metastatic   ROS: Denies fever, fatigue, unexplained weight loss/gain, chest pain, SHOB, and palpitations. Denies neurological deficits, gastrointestinal or genitourinary complaints, and skin changes.   Objective:   Today's Vitals   04/25/24 1447  BP: 118/79  Pulse: 65  Resp: 16  SpO2: 96%  Weight: (!) 310 lb (140.6 kg)  Height: 5' 3 (1.6 m)  PainSc: 0-No pain    GENERAL APPEARANCE: Well-appearing, in NAD. Well nourished.  SKIN: Pink, warm and dry. Turgor normal. No rash, lesion, ulceration, or ecchymoses. Hair evenly distributed.  HEENT: HEAD: Normocephalic.  EYES: PERRLA. EOMI. Lids intact w/o defect. Sclera white, Conjunctiva pink w/o exudate.  EARS: External ear w/o redness, swelling, masses or lesions. EAC clear. TM's intact, translucent w/o bulging, appropriate landmarks visualized. Appropriate acuity to conversational tones.  NOSE: Septum midline w/o deformity. Nares patent, mucosa pink and non-inflamed w/o drainage. No sinus tenderness.  THROAT: Uvula midline. Oropharynx clear. Tonsils non-inflamed w/o exudate. Oral mucosa pink and moist.  NECK: Supple, Trachea midline. Full ROM w/o pain or tenderness. No lymphadenopathy. Thyroid  non-tender w/o enlargement or palpable masses.  BREASTS: Deferred.  RESPIRATORY: Chest wall symmetrical w/o masses. Respirations even and non-labored. Breath sounds clear to auscultation bilaterally. No wheezes, rales, rhonchi, or crackles. CARDIAC: S1, S2 present, regular rate and rhythm. No gallops, murmurs, rubs, or clicks. PMI w/o lifts, heaves, or thrills. No carotid bruits. Capillary refill <2 seconds. Peripheral pulses 2+ bilaterally. GI: Abdomen soft w/o distention. Normoactive bowel sounds. No palpable masses or tenderness. No guarding or rebound tenderness. Liver and spleen w/o tenderness or enlargement. No CVA tenderness.  GU: Deferred.  MSK: Muscle tone and strength  appropriate for age, w/o atrophy or abnormal movement.  EXTREMITIES: Active ROM intact, w/o tenderness, crepitus, or contracture. No obvious joint deformities or effusions. No clubbing, edema, or cyanosis.  NEUROLOGIC: CN's II-XII intact. Motor strength symmetrical with no obvious weakness. No sensory deficits. DTR's 2+ symmetric bilaterally. Steady, even gait.  PSYCH/MENTAL STATUS: Alert, oriented x 3. Cooperative, appropriate mood and affect.   Assessment & Plan:   Orders Placed This Encounter  Procedures   CBC with Differential/Platelet   Comprehensive metabolic panel with GFR   Hemoglobin A1c   Lipid panel   TSH Rfx on Abnormal to Free T4   HCV RNA quant rflx ultra or genotyp   HIV Antibody (routine testing w rflx)   RPR   NuSwab Vaginitis Plus (VG+)   1. Wellness examination (Primary) - Encouraged a healthy well-balanced diet. Patient may adjust caloric intake to maintain or achieve ideal body weight. May reduce intake of dietary saturated fat and total fat and have adequate dietary potassium and calcium preferably from fresh fruits, vegetables, and low-fat dairy products.   - Advised to avoid cigarette smoking. - Discussed with the patient that most people either abstain from alcohol or drink within safe limits (<=14/week and <=4 drinks/occasion for males, <=7/weeks and <= 3 drinks/occasion for females) and that the risk for alcohol disorders and other health effects rises proportionally with the number of drinks per week and how often a drinker exceeds daily limits. - Discussed cessation/primary prevention of drug use and availability of treatment for abuse.  - Discussed sexually transmitted diseases, avoidance of unintended pregnancy and contraceptive alternatives. - Stressed the importance of regular exercise - Injury prevention: Discussed safety belts, safety helmets, smoke detector, smoking near bedding or upholstery.  - Dental health: Discussed importance of regular tooth  brushing, flossing, and dental visits.  NEXT PREVENTATIVE PHYSICAL DUE IN 1 YEAR.  2. Screen for sexually transmitted diseases Will complete STI screening.  - HCV RNA quant rflx ultra or genotyp - HIV Antibody (routine testing w rflx) - RPR - NuSwab Vaginitis Plus (VG+)  3. Healthcare maintenance Will complete fasting CPE labs.  - CBC with Differential/Platelet - Comprehensive metabolic panel with GFR - Hemoglobin A1c - Lipid panel - TSH Rfx on Abnormal to Free T4   Return in about 1 year (around 04/25/2025) for Physical with fasting labs.  Patient to reach out to office if new, worrisome, or unresolved symptoms arise or if no improvement in patient's condition. Patient verbalized understanding and is agreeable to treatment plan. All questions answered to patient's satisfaction.   Evalene Arts, FNP

## 2024-04-26 ENCOUNTER — Encounter
Admission: RE | Admit: 2024-04-26 | Discharge: 2024-04-26 | Disposition: A | Discharge status: Home / Self Care | Source: Ambulatory Visit | Attending: Podiatry | Admitting: Podiatry

## 2024-04-26 ENCOUNTER — Other Ambulatory Visit: Payer: Self-pay

## 2024-04-26 DIAGNOSIS — Z6841 Body Mass Index (BMI) 40.0 and over, adult: Secondary | ICD-10-CM | POA: Diagnosis not present

## 2024-04-26 DIAGNOSIS — Z01812 Encounter for preprocedural laboratory examination: Secondary | ICD-10-CM

## 2024-04-26 DIAGNOSIS — D649 Anemia, unspecified: Secondary | ICD-10-CM

## 2024-04-26 DIAGNOSIS — I471 Supraventricular tachycardia, unspecified: Secondary | ICD-10-CM | POA: Diagnosis not present

## 2024-04-26 DIAGNOSIS — G4733 Obstructive sleep apnea (adult) (pediatric): Secondary | ICD-10-CM | POA: Diagnosis not present

## 2024-04-26 DIAGNOSIS — F17210 Nicotine dependence, cigarettes, uncomplicated: Secondary | ICD-10-CM | POA: Diagnosis not present

## 2024-04-26 DIAGNOSIS — Z008 Encounter for other general examination: Secondary | ICD-10-CM | POA: Diagnosis not present

## 2024-04-26 HISTORY — DX: Displaced fracture of fourth metatarsal bone, right foot, subsequent encounter for fracture with nonunion: S92.341K

## 2024-04-26 LAB — COMPREHENSIVE METABOLIC PANEL WITH GFR
ALT: 14 IU/L (ref 0–32)
AST: 17 IU/L (ref 0–40)
Albumin: 3.9 g/dL (ref 3.9–4.9)
Alkaline Phosphatase: 140 IU/L — ABNORMAL HIGH (ref 41–116)
BUN/Creatinine Ratio: 19 (ref 9–23)
BUN: 16 mg/dL (ref 6–20)
Bilirubin Total: <0.2 mg/dL (ref 0.0–1.2)
CO2: 21 mmol/L (ref 20–29)
Calcium: 8.9 mg/dL (ref 8.7–10.2)
Chloride: 104 mmol/L (ref 96–106)
Creatinine, Ser: 0.83 mg/dL (ref 0.57–1.00)
Globulin, Total: 3.2 g/dL (ref 1.5–4.5)
Glucose: 96 mg/dL (ref 70–99)
Potassium: 4.3 mmol/L (ref 3.5–5.2)
Sodium: 140 mmol/L (ref 134–144)
Total Protein: 7.1 g/dL (ref 6.0–8.5)
eGFR: 95 mL/min/1.73 (ref >59)

## 2024-04-26 LAB — TSH RFX ON ABNORMAL TO FREE T4: TSH: 2.13 u[IU]/mL (ref 0.450–4.500)

## 2024-04-26 LAB — CBC WITH DIFFERENTIAL/PLATELET
Basophils Absolute: 0.1 x10E3/uL (ref 0.0–0.2)
Basos: 1 %
EOS (ABSOLUTE): 0.1 x10E3/uL (ref 0.0–0.4)
Eos: 1 %
Hematocrit: 39.1 % (ref 34.0–46.6)
Hemoglobin: 12.5 g/dL (ref 11.1–15.9)
Immature Grans (Abs): 0 x10E3/uL (ref 0.0–0.1)
Immature Granulocytes: 0 %
Lymphocytes Absolute: 3.6 x10E3/uL — ABNORMAL HIGH (ref 0.7–3.1)
Lymphs: 54 %
MCH: 28.9 pg (ref 26.6–33.0)
MCHC: 32 g/dL (ref 31.5–35.7)
MCV: 90 fL (ref 79–97)
Monocytes Absolute: 0.4 x10E3/uL (ref 0.1–0.9)
Monocytes: 6 %
Neutrophils Absolute: 2.5 x10E3/uL (ref 1.4–7.0)
Neutrophils: 38 %
Platelets: 375 x10E3/uL (ref 150–450)
RBC: 4.33 x10E6/uL (ref 3.77–5.28)
RDW: 12.9 % (ref 11.7–15.4)
WBC: 6.7 x10E3/uL (ref 3.4–10.8)

## 2024-04-26 LAB — SYPHILIS: RPR W/REFLEX TO RPR TITER AND TREPONEMAL ANTIBODIES, TRADITIONAL SCREENING AND DIAGNOSIS ALGORITHM: RPR Ser Ql: NONREACTIVE

## 2024-04-26 LAB — LIPID PANEL
Chol/HDL Ratio: 5.1 ratio — ABNORMAL HIGH (ref 0.0–4.4)
Cholesterol, Total: 193 mg/dL (ref 100–199)
HDL: 38 mg/dL — ABNORMAL LOW
LDL Chol Calc (NIH): 138 mg/dL — ABNORMAL HIGH (ref 0–99)
Triglycerides: 94 mg/dL (ref 0–149)
VLDL Cholesterol Cal: 17 mg/dL (ref 5–40)

## 2024-04-26 LAB — HCV RNA QUANT RFLX ULTRA OR GENOTYP: HCV Quant Baseline: NOT DETECTED [IU]/mL

## 2024-04-26 LAB — HEMOGLOBIN A1C
Est. average glucose Bld gHb Est-mCnc: 114 mg/dL
Hgb A1c MFr Bld: 5.6 % (ref 4.8–5.6)

## 2024-04-26 LAB — HIV ANTIBODY (ROUTINE TESTING W REFLEX): HIV Screen 4th Generation wRfx: NONREACTIVE

## 2024-04-26 NOTE — Patient Instructions (Addendum)
 Your procedure is scheduled on:05/03/24 - Friday Report to the Registration Desk on the 1st floor of the [facility]. To find out your arrival time, please call [phone] between 1PM - 3PM on: 05/02/24 - Thursday If your arrival time is 6:00 am, do not arrive before that time as the [facility] entrance doors do not open until 6:00 am.  REMEMBER: Instructions that are not followed completely may result in serious medical risk, up to and including death; or upon the discretion of your surgeon and anesthesiologist your surgery may need to be rescheduled.  Do not eat food or drink any liquids after midnight the night before surgery.  No gum chewing or hard candies.  One week prior to surgery: Stop Anti-inflammatories (NSAIDS) such as Advil, Aleve, Ibuprofen, Motrin, Naproxen, Naprosyn and Aspirin based products such as Excedrin, Goody's Powder, BC Powder. You may take Tylenol  if needed for pain up until the day of surgery.  Stop ANY OVER THE COUNTER supplements until after surgery : MULTIVITAMIN   ON THE DAY OF SURGERY ONLY TAKE THESE MEDICATIONS WITH SIPS OF WATER :  escitalopram (LEXAPRO)  metoprolol  succinate (TOPROL -XL)  VRAYLAR    No Alcohol for 24 hours before or after surgery.  No Smoking including e-cigarettes for 24 hours before surgery.  No chewable tobacco products for at least 6 hours before surgery.  No nicotine  patches on the day of surgery.  Do not use any recreational drugs for at least a week (preferably 2 weeks) before your surgery.  Please be advised that the combination of cocaine and anesthesia may have negative outcomes, up to and including death. If you test positive for cocaine, your surgery will be cancelled.  On the morning of surgery brush your teeth with toothpaste and water , you may rinse your mouth with mouthwash if you wish. Do not swallow any toothpaste or mouthwash.  Use CHG Soap or wipes as directed on instruction sheet.  Do not wear  jewelry, make-up, hairpins, clips or nail polish.  For welded (permanent) jewelry: bracelets, anklets, waist bands, etc.  Please have this removed prior to surgery.  If it is not removed, there is a chance that hospital personnel will need to cut it off on the day of surgery.  Do not wear lotions, powders, or perfumes.   Do not shave body hair from the neck down 48 hours before surgery.  Contact lenses, hearing aids and dentures may not be worn into surgery.  Do not bring valuables to the hospital. [facility] is not responsible for any missing/lost belongings or valuables.   Bring your C-PAP to the hospital in case you may have to spend the night.   Notify your doctor if there is any change in your medical condition (cold, fever, infection).  Wear comfortable clothing (specific to your surgery type) to the hospital.  After surgery, you can help prevent lung complications by doing breathing exercises.  Take deep breaths and cough every 1-2 hours. Your doctor may order a device called an Incentive Spirometer to help you take deep breaths.  When coughing or sneezing, hold a pillow firmly against your incision with both hands. This is called "splinting." Doing this helps protect your incision. It also decreases belly discomfort.  If you are being admitted to the hospital overnight, leave your suitcase in the car. After surgery it may be brought to your room.  In case of increased patient census, it may be necessary for you, the patient, to continue your postoperative care in  the Same Day Surgery department.  If you are being discharged the day of surgery, you will not be allowed to drive home. You will need a responsible individual to drive you home and stay with you for 24 hours after surgery.   If you are taking public transportation, you will need to have a responsible individual with you.  Please call the Pre-admissions Testing Dept. at [phone] if you have any questions about  these instructions.  Surgery Visitation Policy:  Patients having surgery or a procedure may have two visitors.  Children under the age of 56 must have an adult with them who is not the patient.  Inpatient Visitation:    Visiting hours are 7 a.m. to 8 p.m. Up to four visitors are allowed at one time in a patient room. The visitors may rotate out with other people during the day.  One visitor age 45 or older may stay with the patient overnight and must be in the room by 8 p.m.   Merchandiser, retail to address health-related social needs:  [email or web address]                                                                                                            Preparing for Surgery with CHLORHEXIDINE  GLUCONATE (CHG) Soap  Chlorhexidine  Gluconate (CHG) Soap  o An antiseptic cleaner that kills germs and bonds with the skin to continue killing germs even after washing  o Used for showering the night before surgery and morning of surgery  Before surgery, you can play an important role by reducing the number of germs on your skin.  CHG (Chlorhexidine  gluconate) soap is an antiseptic cleanser which kills germs and bonds with the skin to continue killing germs even after washing.  Please do not use if you have an allergy to CHG or antibacterial soaps. If your skin becomes reddened/irritated stop using the CHG.  1. Shower the NIGHT BEFORE SURGERY with CHG soap.  2. If you choose to wash your hair, wash your hair first as usual with your normal shampoo.  3. After shampooing, rinse your hair and body thoroughly to remove the shampoo.  4. Use CHG as you would any other liquid soap. You can apply CHG directly to the skin and wash gently with a clean washcloth.  5. Apply the CHG soap to your body only from the neck down. Do not use on open wounds or open sores. Avoid contact with your eyes, ears, mouth, and genitals (private parts). Wash face and genitals (private  parts) with your normal soap.  6. Wash thoroughly, paying special attention to the area where your surgery will be performed.  7. Thoroughly rinse your body with warm water .  8. Do not shower/wash with your normal soap after using and rinsing off the CHG soap.  9. Do not use lotions, oils, etc., after showering with CHG.  10. Pat yourself dry with a clean towel.  11. Wear clean pajamas to bed the night before surgery.  12. Place clean sheets on your bed the  night of your shower and do not sleep with pets.  13. Do not apply any deodorants/lotions/powders.  14. Please wear clean clothes to the hospital.  15. Remember to brush your teeth with your regular toothpaste.

## 2024-04-27 LAB — NUSWAB VAGINITIS PLUS (VG+)
Atopobium vaginae: HIGH {score} — AB
BVAB 2: HIGH {score} — AB
Candida albicans, NAA: NEGATIVE
Candida glabrata, NAA: NEGATIVE
Chlamydia trachomatis, NAA: NEGATIVE
Megasphaera 1: HIGH {score} — AB
Neisseria gonorrhoeae, NAA: NEGATIVE
Trich vag by NAA: NEGATIVE

## 2024-04-29 ENCOUNTER — Encounter
Admission: RE | Admit: 2024-04-29 | Discharge: 2024-04-29 | Disposition: A | Discharge status: Home / Self Care | Source: Ambulatory Visit | Attending: Podiatry | Admitting: Podiatry

## 2024-04-29 ENCOUNTER — Telehealth: Payer: Self-pay | Admitting: Podiatry

## 2024-04-29 DIAGNOSIS — D649 Anemia, unspecified: Secondary | ICD-10-CM | POA: Diagnosis not present

## 2024-04-29 DIAGNOSIS — I471 Supraventricular tachycardia, unspecified: Secondary | ICD-10-CM | POA: Diagnosis not present

## 2024-04-29 DIAGNOSIS — Z01818 Encounter for other preprocedural examination: Secondary | ICD-10-CM | POA: Diagnosis not present

## 2024-04-29 DIAGNOSIS — Z01810 Encounter for preprocedural cardiovascular examination: Secondary | ICD-10-CM | POA: Diagnosis not present

## 2024-04-29 LAB — BASIC METABOLIC PANEL WITH GFR
Anion gap: 15 (ref 5–15)
BUN: 13 mg/dL (ref 6–20)
CO2: 22 mmol/L (ref 22–32)
Calcium: 8.9 mg/dL (ref 8.9–10.3)
Chloride: 101 mmol/L (ref 98–111)
Creatinine, Ser: 0.97 mg/dL (ref 0.44–1.00)
GFR, Estimated: >60 mL/min (ref >60)
Glucose, Bld: 108 mg/dL — ABNORMAL HIGH (ref 70–99)
Potassium: 4 mmol/L (ref 3.5–5.1)
Sodium: 138 mmol/L (ref 135–145)

## 2024-04-29 LAB — CBC
HCT: 37.8 % (ref 36.0–46.0)
Hemoglobin: 12.7 g/dL (ref 12.0–15.0)
MCH: 29 pg (ref 26.0–34.0)
MCHC: 33.6 g/dL (ref 30.0–36.0)
MCV: 86.3 fL (ref 80.0–100.0)
Platelets: 374 K/uL (ref 150–400)
RBC: 4.38 MIL/uL (ref 3.87–5.11)
RDW: 13.8 % (ref 11.5–15.5)
WBC: 6.5 K/uL (ref 4.0–10.5)
nRBC: 0 % (ref 0.0–0.2)

## 2024-04-29 NOTE — Telephone Encounter (Signed)
 Left message for patient to contact office back to see if we can move surgery from 10/17 to 10/16 @ [facility].

## 2024-04-30 ENCOUNTER — Ambulatory Visit: Payer: Self-pay | Admitting: Family Medicine

## 2024-04-30 DIAGNOSIS — B9689 Other specified bacterial agents as the cause of diseases classified elsewhere: Secondary | ICD-10-CM

## 2024-04-30 MED ORDER — METRONIDAZOLE 500 MG PO TABS: 500.0000 mg | ORAL_TABLET | Freq: Two times a day (BID) | ORAL | 0 refills | Status: AC

## 2024-05-02 ENCOUNTER — Ambulatory Visit (INDEPENDENT_AMBULATORY_CARE_PROVIDER_SITE_OTHER): Admitting: Family Medicine

## 2024-05-02 VITALS — BP 135/86 | HR 86 | Resp 16 | Ht 63.0 in | Wt 309.0 lb

## 2024-05-02 DIAGNOSIS — G4733 Obstructive sleep apnea (adult) (pediatric): Secondary | ICD-10-CM | POA: Diagnosis not present

## 2024-05-02 DIAGNOSIS — Z6841 Body Mass Index (BMI) 40.0 and over, adult: Secondary | ICD-10-CM

## 2024-05-02 MED ORDER — TIRZEPATIDE-WEIGHT MANAGEMENT 2.5 MG/0.5ML ~~LOC~~ SOAJ: 2.5000 mg | SUBCUTANEOUS | 1 refills | Status: DC

## 2024-05-02 NOTE — H&P (View-Only) (Signed)
 Established Patient Office Visit  Subjective  Patient ID: Maria Espinoza, female    DOB: Oct 27, 1990  Age: 33 y.o. MRN: [id number]  Chief Complaint  Patient presents with   Obesity    Wants to start Wegovy  again.   Discussed the use of AI scribe software for clinical note transcription with the patient, who gave verbal consent to proceed.  History of Present Illness   Maria Espinoza is a 33 year old female who presents for management of her obstructive sleep apnea and interest in weight loss medication management.  She is interested in restarting weight loss medication after discontinuing Wegovy  three months ago due to personal circumstances, including moving. She wants to resume treatment to aid in weight management.  She was diagnosed with sleep apnea following a sleep study in March 4th, 2025 and is awaiting the delivery of a CPAP machine. The delivery has been delayed due to a change in her insurance provider from [place] to [place]. She is in contact with [facility] regarding the status of her CPAP machine order. She reports that during her sleep study, she did have an episode of atrial fibrillation. Review of chart- Epworth scale greater than 9, habitual loud snoring, observed apnea during sleep study.   She is scheduled for foot surgery tomorrow and recalls a previous surgery where she was non-weight bearing for at least three months, possibly up to six months, due to having rods and a device in her foot. She anticipates a similar recovery period following the upcoming surgery.     ROS: see HPI     Objective:     BP 135/86   Pulse 86   Resp 16   Ht 5' 3 (1.6 m)   Wt (!) 309 lb (140.2 kg)   LMP 03/25/2024 (Exact Date)   SpO2 96%   BMI 54.74 kg/m  BP Readings from Last 3 Encounters:  05/02/24 135/86  04/25/24 118/79  04/18/24 132/84     Physical Exam Constitutional:      Appearance: Normal appearance. She is obese.  Cardiovascular:     Rate and Rhythm: Normal  rate and regular rhythm.     Pulses: Normal pulses.     Heart sounds: Normal heart sounds.  Pulmonary:     Effort: Pulmonary effort is normal.     Breath sounds: Normal breath sounds.  Neurological:     Mental Status: She is alert.  Psychiatric:        Mood and Affect: Mood normal.        Behavior: Behavior normal.     Assessment & Plan:   1. OSA on CPAP (Primary) Obstructive sleep apnea. Awaiting CPAP machine due to insurance delays. Follow up with pulmonologist regarding CPAP machine delivery. Patient would significantly benefit from Zepbound. Will complete prior authorization and notify patient if insurance approves.  - tirzepatide (ZEPBOUND) 2.5 MG/0.5ML Pen; Inject 2.5 mg into the skin once a week.  Dispense: 2 mL; Refill: 1  2. Morbid obesity with BMI of 50.0-59.9, adult (HCC) Morbid obesity due to excess calories. Discontinued Wegovy . Zepbound may be approved due to sleep apnea indication. Initiate Zepbound at 2.5 mg. Increase dose every four weeks as tolerated. Will complete prior authorization for Zepbound for management of OSA. Patient would significantly benefit from weight loss due to upcoming foot surgery, where she will have to be inactive due to healing process.  - tirzepatide (ZEPBOUND) 2.5 MG/0.5ML Pen; Inject 2.5 mg into the skin once a week.  Dispense: 2 mL;  Refill: 1   Return in about 6 weeks (around 06/13/2024) for weight loss f/u .    Evalene Arts, FNP

## 2024-05-02 NOTE — Progress Notes (Signed)
 Established Patient Office Visit  Subjective  Patient ID: Maria Espinoza, female    DOB: Oct 27, 1990  Age: 33 y.o. MRN: [id number]  Chief Complaint  Patient presents with   Obesity    Wants to start Wegovy  again.   Discussed the use of AI scribe software for clinical note transcription with the patient, who gave verbal consent to proceed.  History of Present Illness   Maria Espinoza is a 33 year old female who presents for management of her obstructive sleep apnea and interest in weight loss medication management.  She is interested in restarting weight loss medication after discontinuing Wegovy  three months ago due to personal circumstances, including moving. She wants to resume treatment to aid in weight management.  She was diagnosed with sleep apnea following a sleep study in March 4th, 2025 and is awaiting the delivery of a CPAP machine. The delivery has been delayed due to a change in her insurance provider from [place] to [place]. She is in contact with [facility] regarding the status of her CPAP machine order. She reports that during her sleep study, she did have an episode of atrial fibrillation. Review of chart- Epworth scale greater than 9, habitual loud snoring, observed apnea during sleep study.   She is scheduled for foot surgery tomorrow and recalls a previous surgery where she was non-weight bearing for at least three months, possibly up to six months, due to having rods and a device in her foot. She anticipates a similar recovery period following the upcoming surgery.     ROS: see HPI     Objective:     BP 135/86   Pulse 86   Resp 16   Ht 5' 3 (1.6 m)   Wt (!) 309 lb (140.2 kg)   LMP 03/25/2024 (Exact Date)   SpO2 96%   BMI 54.74 kg/m  BP Readings from Last 3 Encounters:  05/02/24 135/86  04/25/24 118/79  04/18/24 132/84     Physical Exam Constitutional:      Appearance: Normal appearance. She is obese.  Cardiovascular:     Rate and Rhythm: Normal  rate and regular rhythm.     Pulses: Normal pulses.     Heart sounds: Normal heart sounds.  Pulmonary:     Effort: Pulmonary effort is normal.     Breath sounds: Normal breath sounds.  Neurological:     Mental Status: She is alert.  Psychiatric:        Mood and Affect: Mood normal.        Behavior: Behavior normal.     Assessment & Plan:   1. OSA on CPAP (Primary) Obstructive sleep apnea. Awaiting CPAP machine due to insurance delays. Follow up with pulmonologist regarding CPAP machine delivery. Patient would significantly benefit from Zepbound. Will complete prior authorization and notify patient if insurance approves.  - tirzepatide (ZEPBOUND) 2.5 MG/0.5ML Pen; Inject 2.5 mg into the skin once a week.  Dispense: 2 mL; Refill: 1  2. Morbid obesity with BMI of 50.0-59.9, adult (HCC) Morbid obesity due to excess calories. Discontinued Wegovy . Zepbound may be approved due to sleep apnea indication. Initiate Zepbound at 2.5 mg. Increase dose every four weeks as tolerated. Will complete prior authorization for Zepbound for management of OSA. Patient would significantly benefit from weight loss due to upcoming foot surgery, where she will have to be inactive due to healing process.  - tirzepatide (ZEPBOUND) 2.5 MG/0.5ML Pen; Inject 2.5 mg into the skin once a week.  Dispense: 2 mL;  Refill: 1   Return in about 6 weeks (around 06/13/2024) for weight loss f/u .    Evalene Arts, FNP

## 2024-05-02 NOTE — Patient Instructions (Signed)

## 2024-05-03 ENCOUNTER — Ambulatory Visit: Admitting: General Practice

## 2024-05-03 ENCOUNTER — Telehealth: Payer: Self-pay

## 2024-05-03 ENCOUNTER — Other Ambulatory Visit: Payer: Self-pay

## 2024-05-03 ENCOUNTER — Ambulatory Visit: Payer: Self-pay | Admitting: Urgent Care

## 2024-05-03 ENCOUNTER — Encounter: Payer: Self-pay | Admitting: Podiatry

## 2024-05-03 ENCOUNTER — Encounter
Admission: RE | Disposition: A | Discharge status: Home / Self Care | Payer: Self-pay | Source: Home / Self Care | Attending: Podiatry

## 2024-05-03 ENCOUNTER — Ambulatory Visit
Admission: RE | Admit: 2024-05-03 | Discharge: 2024-05-03 | Disposition: A | Discharge status: Home / Self Care | Attending: Podiatry | Admitting: Podiatry

## 2024-05-03 ENCOUNTER — Ambulatory Visit

## 2024-05-03 DIAGNOSIS — Z01812 Encounter for preprocedural laboratory examination: Secondary | ICD-10-CM

## 2024-05-03 DIAGNOSIS — X58XXXA Exposure to other specified factors, initial encounter: Secondary | ICD-10-CM | POA: Diagnosis not present

## 2024-05-03 DIAGNOSIS — S92341A Displaced fracture of fourth metatarsal bone, right foot, initial encounter for closed fracture: Secondary | ICD-10-CM | POA: Diagnosis not present

## 2024-05-03 DIAGNOSIS — S92301K Fracture of unspecified metatarsal bone(s), right foot, subsequent encounter for fracture with nonunion: Secondary | ICD-10-CM | POA: Diagnosis present

## 2024-05-03 DIAGNOSIS — Z6841 Body Mass Index (BMI) 40.0 and over, adult: Secondary | ICD-10-CM | POA: Diagnosis not present

## 2024-05-03 DIAGNOSIS — Z7985 Long-term (current) use of injectable non-insulin antidiabetic drugs: Secondary | ICD-10-CM | POA: Diagnosis not present

## 2024-05-03 DIAGNOSIS — G4733 Obstructive sleep apnea (adult) (pediatric): Secondary | ICD-10-CM | POA: Diagnosis not present

## 2024-05-03 HISTORY — PX: CLOSED REDUCTION METATARSAL: SHX5774

## 2024-05-03 HISTORY — PX: HARVEST BONE GRAFT: SHX377

## 2024-05-03 LAB — POCT PREGNANCY, URINE: Preg Test, Ur: NEGATIVE

## 2024-05-03 SURGERY — CLOSED REDUCTION, FRACTURE, METATARSAL BONE
Anesthesia: General | Site: Toe | Laterality: Right

## 2024-05-03 MED ORDER — KETAMINE HCL 50 MG/5ML IJ SOSY
PREFILLED_SYRINGE | INTRAMUSCULAR | Status: DC | PRN
  Administered 2024-05-03: 50 mg via INTRAVENOUS

## 2024-05-03 MED ORDER — LIDOCAINE HCL (PF) 2 % IJ SOLN
INTRAMUSCULAR | Status: AC
  Filled 2024-05-03: qty 5

## 2024-05-03 MED ORDER — FENTANYL CITRATE (PF) 100 MCG/2ML IJ SOLN
INTRAMUSCULAR | Status: AC
  Filled 2024-05-03: qty 2

## 2024-05-03 MED ORDER — CHLORHEXIDINE GLUCONATE 0.12 % MT SOLN
15.0000 mL | Freq: Once | OROMUCOSAL | Status: AC
  Administered 2024-05-03: 15 mL via OROMUCOSAL

## 2024-05-03 MED ORDER — DEXAMETHASONE SOD PHOSPHATE PF 10 MG/ML IJ SOLN
INTRAMUSCULAR | Status: DC | PRN
  Administered 2024-05-03: 4 mg via INTRAVENOUS

## 2024-05-03 MED ORDER — DEXMEDETOMIDINE HCL IN NACL 80 MCG/20ML IV SOLN
INTRAVENOUS | Status: DC | PRN
  Administered 2024-05-03: 8 ug via INTRAVENOUS
  Administered 2024-05-03 (×2): 12 ug via INTRAVENOUS

## 2024-05-03 MED ORDER — ESMOLOL HCL 100 MG/10ML IV SOLN
INTRAVENOUS | Status: DC | PRN
  Administered 2024-05-03: 20 mg via INTRAVENOUS

## 2024-05-03 MED ORDER — PHENYLEPHRINE 80 MCG/ML (10ML) SYRINGE FOR IV PUSH (FOR BLOOD PRESSURE SUPPORT)
PREFILLED_SYRINGE | INTRAVENOUS | Status: DC | PRN
  Administered 2024-05-03 (×2): 80 ug via INTRAVENOUS

## 2024-05-03 MED ORDER — EPHEDRINE SULFATE-NACL 50-0.9 MG/10ML-% IV SOSY
PREFILLED_SYRINGE | INTRAVENOUS | Status: DC | PRN
  Administered 2024-05-03: 5 mg via INTRAVENOUS

## 2024-05-03 MED ORDER — OXYCODONE HCL 5 MG PO TABS
ORAL_TABLET | ORAL | Status: AC
  Filled 2024-05-03: qty 1

## 2024-05-03 MED ORDER — 0.9 % SODIUM CHLORIDE (POUR BTL) OPTIME
TOPICAL | Status: DC | PRN
  Administered 2024-05-03: 500 mL

## 2024-05-03 MED ORDER — KETAMINE HCL 50 MG/5ML IJ SOSY
PREFILLED_SYRINGE | INTRAMUSCULAR | Status: AC
  Filled 2024-05-03: qty 5

## 2024-05-03 MED ORDER — MIDAZOLAM HCL 2 MG/2ML IJ SOLN
INTRAMUSCULAR | Status: AC
  Filled 2024-05-03: qty 2

## 2024-05-03 MED ORDER — ORAL CARE MOUTH RINSE: 15.0000 mL | Freq: Once | OROMUCOSAL | Status: AC

## 2024-05-03 MED ORDER — BUPIVACAINE HCL (PF) 0.5 % IJ SOLN
INTRAMUSCULAR | Status: AC
  Filled 2024-05-03: qty 30

## 2024-05-03 MED ORDER — FENTANYL CITRATE (PF) 100 MCG/2ML IJ SOLN
25.0000 ug | INTRAMUSCULAR | Status: DC | PRN
  Administered 2024-05-03 (×3): 50 ug via INTRAVENOUS

## 2024-05-03 MED ORDER — ROCURONIUM BROMIDE 10 MG/ML (PF) SYRINGE
PREFILLED_SYRINGE | INTRAVENOUS | Status: AC
  Filled 2024-05-03: qty 10

## 2024-05-03 MED ORDER — BUPIVACAINE LIPOSOME 1.3 % IJ SUSP
INTRAMUSCULAR | Status: DC | PRN
  Administered 2024-05-03: 20 mL via INTRAMUSCULAR

## 2024-05-03 MED ORDER — OXYCODONE HCL 5 MG/5ML PO SOLN: 5.0000 mg | Freq: Once | ORAL | Status: AC | PRN

## 2024-05-03 MED ORDER — PROPOFOL 10 MG/ML IV BOLUS
INTRAVENOUS | Status: DC | PRN
  Administered 2024-05-03: 200 mg via INTRAVENOUS

## 2024-05-03 MED ORDER — GLYCOPYRROLATE 0.2 MG/ML IJ SOLN
INTRAMUSCULAR | Status: DC | PRN
  Administered 2024-05-03: .1 mg via INTRAVENOUS
  Administered 2024-05-03: .2 mg via INTRAVENOUS

## 2024-05-03 MED ORDER — CHLORHEXIDINE GLUCONATE 0.12 % MT SOLN
OROMUCOSAL | Status: AC
  Filled 2024-05-03: qty 15

## 2024-05-03 MED ORDER — ACETAMINOPHEN 10 MG/ML IV SOLN
INTRAVENOUS | Status: AC
  Filled 2024-05-03: qty 100

## 2024-05-03 MED ORDER — BUPIVACAINE LIPOSOME 1.3 % IJ SUSP
INTRAMUSCULAR | Status: AC
  Filled 2024-05-03: qty 10

## 2024-05-03 MED ORDER — MIDAZOLAM HCL (PF) 2 MG/2ML IJ SOLN
INTRAMUSCULAR | Status: DC | PRN
  Administered 2024-05-03: 2 mg via INTRAVENOUS

## 2024-05-03 MED ORDER — FENTANYL CITRATE (PF) 100 MCG/2ML IJ SOLN
INTRAMUSCULAR | Status: DC | PRN
  Administered 2024-05-03: 25 ug via INTRAVENOUS
  Administered 2024-05-03: 50 ug via INTRAVENOUS
  Administered 2024-05-03: 25 ug via INTRAVENOUS
  Administered 2024-05-03 (×2): 50 ug via INTRAVENOUS

## 2024-05-03 MED ORDER — OXYCODONE-ACETAMINOPHEN 5-325 MG PO TABS: 1.0000 | ORAL_TABLET | ORAL | 0 refills | Status: DC | PRN

## 2024-05-03 MED ORDER — LIDOCAINE HCL (CARDIAC) PF 100 MG/5ML IV SOSY
PREFILLED_SYRINGE | INTRAVENOUS | Status: DC | PRN
  Administered 2024-05-03: 100 mg via INTRAVENOUS

## 2024-05-03 MED ORDER — SUGAMMADEX SODIUM 200 MG/2ML IV SOLN
INTRAVENOUS | Status: DC | PRN
  Administered 2024-05-03: 300 mg via INTRAVENOUS

## 2024-05-03 MED ORDER — ACETAMINOPHEN 10 MG/ML IV SOLN
INTRAVENOUS | Status: DC | PRN
  Administered 2024-05-03: 1000 mg via INTRAVENOUS

## 2024-05-03 MED ORDER — OXYCODONE HCL 5 MG PO TABS
5.0000 mg | ORAL_TABLET | Freq: Once | ORAL | Status: AC | PRN
  Administered 2024-05-03: 5 mg via ORAL

## 2024-05-03 MED ORDER — SUCCINYLCHOLINE CHLORIDE 200 MG/10ML IV SOSY
PREFILLED_SYRINGE | INTRAVENOUS | Status: AC
  Filled 2024-05-03: qty 10

## 2024-05-03 MED ORDER — LACTATED RINGERS IV SOLN: INTRAVENOUS | Status: DC

## 2024-05-03 MED ORDER — CEFAZOLIN SODIUM-DEXTROSE 3-4 GM/150ML-% IV SOLN
3.0000 g | INTRAVENOUS | Status: AC
  Administered 2024-05-03: 3 g via INTRAVENOUS
  Filled 2024-05-03: qty 150

## 2024-05-03 MED ORDER — SUCCINYLCHOLINE CHLORIDE 200 MG/10ML IV SOSY
PREFILLED_SYRINGE | INTRAVENOUS | Status: DC | PRN
  Administered 2024-05-03: 200 mg via INTRAVENOUS

## 2024-05-03 MED ADMIN — Rocuronium Bromide IV Soln 100 MG/10ML (10 MG/ML): 20 mg | INTRAVENOUS | NDC 73177015903

## 2024-05-03 MED ADMIN — Rocuronium Bromide IV Soln 100 MG/10ML (10 MG/ML): 50 mg | INTRAVENOUS | NDC 73177015903

## 2024-05-03 MED ADMIN — Ondansetron HCl Inj 4 MG/2ML (2 MG/ML): 4 mg | INTRAVENOUS | NDC 00409475518

## 2024-05-03 MED ADMIN — Rocuronium Bromide IV Soln 100 MG/10ML (10 MG/ML): 10 mg | INTRAVENOUS | NDC 73177015903

## 2024-05-03 MED FILL — Ketorolac Tromethamine Inj 30 MG/ML: INTRAMUSCULAR | Qty: 1 | Status: AC

## 2024-05-03 MED FILL — Ondansetron HCl Inj 4 MG/2ML (2 MG/ML): INTRAMUSCULAR | Qty: 2 | Status: AC

## 2024-05-03 MED FILL — Propofol IV Emul 200 MG/20ML (10 MG/ML): INTRAVENOUS | Qty: 40 | Status: AC

## 2024-05-03 SURGICAL SUPPLY — 56 items
ALLOGRAFT BONE FIBER KORE 5 (Bone Implant) IMPLANT
BIT DRILL 1.6MM (DRILL) IMPLANT
BIT DRILL 100X1.3XAO CNCT (BIT) IMPLANT
BLADE MED AGGRESSIVE (BLADE) ×2 IMPLANT
BLADE OSC/SAGITTAL MD 5.5X18 (BLADE) IMPLANT
BLADE SURG 15 STRL LF DISP TIS (BLADE) IMPLANT
BLADE SURG MINI STRL (BLADE) IMPLANT
BLADE SW THK.38XMED LNG THN (BLADE) IMPLANT
BNDG ELASTIC 4INX 5YD STR LF (GAUZE/BANDAGES/DRESSINGS) ×2 IMPLANT
BNDG ESMARCH 4X12 STRL LF (GAUZE/BANDAGES/DRESSINGS) ×2 IMPLANT
BNDG GAUZE DERMACEA FLUFF 4 (GAUZE/BANDAGES/DRESSINGS) ×2 IMPLANT
CUFF TOURN SGL QUICK 12 (TOURNIQUET CUFF) IMPLANT
CUFF TOURN SGL QUICK 18X4 (TOURNIQUET CUFF) IMPLANT
CUFF TRNQT CYL 24X4X16.5-23 (TOURNIQUET CUFF) IMPLANT
DRAPE FLUOR MINI C-ARM 54X84 (DRAPES) IMPLANT
DRSG EMULSION OIL 3X3 NADH (GAUZE/BANDAGES/DRESSINGS) ×2 IMPLANT
DURAPREP 26ML APPLICATOR (WOUND CARE) ×2 IMPLANT
ELECTRODE REM PT RTRN 9FT ADLT (ELECTROSURGICAL) ×2 IMPLANT
GAUZE SPONGE 4X4 12PLY STRL (GAUZE/BANDAGES/DRESSINGS) ×4 IMPLANT
GAUZE STRETCH 2X75IN STRL (MISCELLANEOUS) IMPLANT
GAUZE XEROFORM 1X8 LF (GAUZE/BANDAGES/DRESSINGS) ×2 IMPLANT
GLOVE BIO SURGEON STRL SZ8 (GLOVE) ×2 IMPLANT
GLOVE BIOGEL PI IND STRL 8 (GLOVE) ×2 IMPLANT
GOWN STRL REUS W/ TWL LRG LVL3 (GOWN DISPOSABLE) ×4 IMPLANT
KIT PREVENA INCISION MGT 13 (CANNISTER) ×2 IMPLANT
KIT TURNOVER KIT A (KITS) ×2 IMPLANT
LABEL OR SOLS (LABEL) ×2 IMPLANT
MANIFOLD NEPTUNE II (INSTRUMENTS) ×2 IMPLANT
MAT PREVALON FULL STRYKER (MISCELLANEOUS) IMPLANT
NDL FILTER BLUNT 18X1 1/2 (NEEDLE) ×2 IMPLANT
NDL HYPO 25X1 1.5 SAFETY (NEEDLE) ×4 IMPLANT
NEEDLE FILTER BLUNT 18X1 1/2 (NEEDLE) ×2 IMPLANT
NEEDLE HYPO 25X1 1.5 SAFETY (NEEDLE) ×4 IMPLANT
NS IRRIG 500ML POUR BTL (IV SOLUTION) ×2 IMPLANT
PACK EXTREMITY ARMC (MISCELLANEOUS) ×2 IMPLANT
PADDING CAST COTTON 6X4 STRL (CAST SUPPLIES) IMPLANT
PENCIL SMOKE EVACUATOR (MISCELLANEOUS) ×2 IMPLANT
PLATE OBLIQUE 8H (Plate) IMPLANT
RASP SM TEAR CROSS CUT (RASP) IMPLANT
SCREW LOCK GORILLA 2.5X13 (Screw) IMPLANT
SCREW LOCKING 2.5X12 (Screw) IMPLANT
SCREW LOCKING 2.5X14 (Screw) IMPLANT
SOLUTION PREP PVP 2OZ (MISCELLANEOUS) ×2 IMPLANT
STAPLER SKIN PROX 35W (STAPLE) ×2 IMPLANT
STIMULATOR BONE GROWTH EMG EXT (ORTHOPEDIC SUPPLIES) IMPLANT
STOCKINETTE STRL 6IN 960660 (GAUZE/BANDAGES/DRESSINGS) ×2 IMPLANT
SURGILUBE 2OZ TUBE FLIPTOP (MISCELLANEOUS) ×2 IMPLANT
SUT VIC AB 3-0 PS2 18XBRD (SUTURE) IMPLANT
SUT VIC AB 3-0 SH 27X BRD (SUTURE) ×2 IMPLANT
SUT VIC AB 4-0 PS2 27 (SUTURE) IMPLANT
SUT VICRYL+ 4-0 18IN PS-4 (SUTURE) IMPLANT
SUTURE EHLN 3-0 FS-10 30 BLK (SUTURE) ×4 IMPLANT
SYR 10ML LL (SYRINGE) ×2 IMPLANT
TRAP FLUID SMOKE EVACUATOR (MISCELLANEOUS) ×2 IMPLANT
WATER STERILE IRR 500ML POUR (IV SOLUTION) ×2 IMPLANT
WIRE OLIVE SMOOTH 1.3 (WIRE) IMPLANT

## 2024-05-03 NOTE — Brief Op Note (Signed)
 05/03/2024  5:51 PM  PATIENT:  Maria Espinoza  33 y.o. female  PRE-OPERATIVE DIAGNOSIS:  Closed fracture of metatarsal shaft, right, with nonunion  POST-OPERATIVE DIAGNOSIS:  s/p Closed fracture of metatarsal shaft, right, with nonunion  PROCEDURE:  Procedure(s) with comments: CLOSED REDUCTION, FRACTURE, METATARSAL BONE (Right) - REVISION NONUNION RIGHT 4TH METATARSAL PROCEDURE, BONE GRAFT (Right) - CALCANIAL AUTOGRAFT  SURGEON:  Surgeons and Role:    DEWAINE Janit Thresa CHRISTELLA, DPM - Primary  PHYSICIAN ASSISTANT: none  ASSISTANTS: none   ANESTHESIA:   local and general 50:50 mixture of 0.5% marcaine  plain in combination with 10mL exparel   EBL:  50cc   BLOOD ADMINISTERED:none  DRAINS: none   LOCAL MEDICATIONS USED:  BUPIVICAINE  and EXPAREL    SPECIMEN:  No Specimen  DISPOSITION OF SPECIMEN:  N/A  COUNTS:  YES  TOURNIQUET:   Total Tourniquet Time Documented: Calf (Right) - 120 minutes Total: Calf (Right) - 120 minutes   DICTATION: .Dragon Dictation  PLAN OF CARE: Discharge to home after PACU  PATIENT DISPOSITION:  PACU - hemodynamically stable.   Delay start of Pharmacological VTE agent (>24hrs) due to surgical blood loss or risk of bleeding: not applicable  Thresa EMERSON Janit, DPM [facility]  Dr. Thresa EMERSON Janit, DPM    [address] [place], [place] [address]                Office [phone]  Fax [phone]

## 2024-05-03 NOTE — Anesthesia Preprocedure Evaluation (Signed)
 Anesthesia Evaluation  Patient identified by MRN, date of birth, ID band Patient awake    Reviewed: Allergy & Precautions, NPO status , Patient's Chart, lab work & pertinent test results  Airway Mallampati: III  TM Distance: >3 FB Neck ROM: full    Dental  (+) Chipped   Pulmonary neg pulmonary ROS, Current Smoker   Pulmonary exam normal        Cardiovascular negative cardio ROS Normal cardiovascular exam     Neuro/Psych  PSYCHIATRIC DISORDERS Anxiety Depression Bipolar Disorder   negative neurological ROS     GI/Hepatic negative GI ROS, Neg liver ROS,,,  Endo/Other    Class 4 obesity  Renal/GU      Musculoskeletal   Abdominal   Peds  Hematology negative hematology ROS (+)   Anesthesia Other Findings Past Medical History: No date: Anemia No date: Anxiety No date: Bipolar depression (HCC) No date: 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 1 run SVT x 66m 21s, max rate 190-->assoc w/ Ss. No date: Rectal bleeding No date: Sleep apnea 09/30/2018: Suicide attempt by drug overdose [facility])     Comment:  a.) intentionally took a whole bottle of doxepin (#               pills unknown) No date: Syncope and collapse     Comment:  a. 06/2023 Echo: EF 60-65%, no rwma, nl RV fxn, RVSP               32.5 mmHg. Mild MR, mild-mod TR; b.05/2023 Zio:               Predominant sinus rhythm (40-190, average 78). 1 run SVT               x 37m 21s, max rate 190-->assoc w/ Ss.  Past Surgical History: 05/14/2021: HARDWARE REMOVAL; Left     Comment:  Procedure: LEFT FOOT HARDWARE REMOVAL;  Surgeon: Janit Thresa HERO, DPM;  Location: [facility] ORS;  Service: Podiatry;                Laterality: Left; 07/23/2021: HARDWARE REMOVAL; Right     Comment:  Procedure: HARDWARE REMOVAL;  Surgeon: Janit Thresa HERO,               DPM;  Location: [facility] ORS;  Service: Podiatry;                Laterality: Right; 01/15/2021: METATARSAL OSTEOTOMY; Left     Comment:  Procedure: METATARSAL OSTEOTOMY;  Surgeon: Janit Thresa HERO, DPM;  Location: [facility] ORS;  Service: Podiatry;                Laterality: Left; 05/28/2021: METATARSAL OSTEOTOMY; Right     Comment:  Procedure: METATARSAL OSTEOTOMY FOURTH TOE RIGHT FOOT;                Surgeon: Janit Thresa HERO, DPM;  Location: [facility] ORS;  Service: Podiatry;  Laterality: Right; 05/27/2022: METATARSAL OSTEOTOMY; Right     Comment:  Procedure: METATARSAL OSTEOTOMY FOURTH TOE;  Surgeon:               Janit Thresa HERO, DPM;  Location: [facility] ORS;  Service:               Podiatry;  Laterality: Right; No date: TONSILLECTOMY     Comment:  age 33 andoids No date: WISDOM TOOTH EXTRACTION     Reproductive/Obstetrics negative OB ROS                              Anesthesia Physical Anesthesia Plan  ASA: 3  Anesthesia Plan: General ETT   Post-op Pain Management:    Induction: Intravenous  PONV Risk Score and Plan: 3 and Dexamethasone , Ondansetron  and Midazolam   Airway Management Planned: Oral ETT  Additional Equipment:   Intra-op Plan:   Post-operative Plan: Extubation in OR  Informed Consent: I have reviewed the patients History and Physical, chart, labs and discussed the procedure including the risks, benefits and alternatives for the proposed anesthesia with the patient or authorized representative who has indicated his/her understanding and acceptance.     Dental Advisory Given  Plan Discussed with: Anesthesiologist, CRNA and Surgeon  Anesthesia Plan Comments: (Patient consented for risks of anesthesia  including but not limited to:  - adverse reactions to medications - damage to eyes, teeth, lips or other oral mucosa - nerve damage due to positioning  - sore throat or hoarseness - Damage to heart, brain, nerves, lungs, other parts of body or loss of life  Patient voiced understanding and assent.)        Anesthesia Quick Evaluation

## 2024-05-03 NOTE — Anesthesia Procedure Notes (Signed)
 Procedure Name: Intubation Date/Time: 05/03/2024 2:49 PM  Performed by: Myra Lawless, CRNAPre-anesthesia Checklist: Patient identified, Patient being monitored, Timeout performed, Emergency Drugs available and Suction available Patient Re-evaluated:Patient Re-evaluated prior to induction Oxygen Delivery Method: Circle system utilized Preoxygenation: Pre-oxygenation with 100% oxygen Induction Type: IV induction Ventilation: Mask ventilation without difficulty Laryngoscope Size: Mac, 4 and McGrath Grade View: Grade I Tube type: Oral Tube size: 7.0 mm Number of attempts: 1 Airway Equipment and Method: Stylet Placement Confirmation: ETT inserted through vocal cords under direct vision, positive ETCO2 and breath sounds checked- equal and bilateral Secured at: 20 cm Tube secured with: Tape Dental Injury: Teeth and Oropharynx as per pre-operative assessment  Comments: Pt ramped up for optimal intubation condition. Smooth IV induction, easy mask ventilation. DL x1 with McGrath MAC 4 blade, grade 1 view. 7.0 ETT easily placed through vocal cords. Atraumatic intubation.

## 2024-05-03 NOTE — Interval H&P Note (Signed)
 History and Physical Interval Note:  05/03/2024 1:52 PM  Maria Espinoza  has presented today for surgery, with the diagnosis of Closed fracture of metatarsal shaft, right, with nonunion.  The various methods of treatment have been discussed with the patient and family. After consideration of risks, benefits and other options for treatment, the patient has consented to  Procedure(s) with comments: CLOSED REDUCTION, FRACTURE, METATARSAL BONE (Right) - REVISION NONUNION RIGHT 4TH METATARSAL PROCEDURE, BONE GRAFT (Right) - CALCANIAL AUTOGRAFT as a surgical intervention.  The patient's history has been reviewed, patient examined, no change in status, stable for surgery.  I have reviewed the patient's chart and labs.  Questions were answered to the patient's satisfaction.     [name]

## 2024-05-03 NOTE — Telephone Encounter (Signed)
 Why was coverage for this drug denied?  We denied coverage for this drug because: Your plan's Medicare Part D drug plan cannot cover drugs  used for loss of appetite (anorexia), weight loss, or weight gain. Your Medicare Part D drug plan was  asked to cover the requested drug. The requested drug is used for loss of appetite (anorexia), weight loss,  or to help you gain weight. Under section 1927(d)(2) of the Social Security Act, drugs used for loss of  appetite (anorexia), weight loss, or weight gain are excluded from coverage under the Medicare Part D  drug benefit. Since the requested indication is excluded from Medicare Part D coverage, the request for  coverage under your Medicare Part D benefit is denied.   Expedited appeal has been faxed to [phone] due to Zepbound being prescribed for OSA not weight mgt.

## 2024-05-03 NOTE — Anesthesia Postprocedure Evaluation (Signed)
 Anesthesia Post Note  Patient: Leon Miracle  Procedure(s) Performed: CLOSED REDUCTION, FRACTURE, METATARSAL BONE (Right: Toe) PROCEDURE, BONE GRAFT (Right)  Patient location during evaluation: PACU Anesthesia Type: General Level of consciousness: awake and alert Pain management: pain level controlled Vital Signs Assessment: post-procedure vital signs reviewed and stable Respiratory status: spontaneous breathing, nonlabored ventilation, respiratory function stable and patient connected to nasal cannula oxygen Cardiovascular status: blood pressure returned to baseline and stable Postop Assessment: no apparent nausea or vomiting Anesthetic complications: no   No notable events documented.   Last Vitals:  Vitals:   05/03/24 1816 05/03/24 1830  BP: 109/68 118/82  Pulse: 100 84  Resp: 18 20  Temp:  (!) 36.1 C  SpO2: 95% 97%    Last Pain:  Vitals:   05/03/24 1830  TempSrc:   PainSc: 6                  Debby Mines

## 2024-05-03 NOTE — Telephone Encounter (Signed)
(  Key: [id number])  Rx #: [id number]  Zepbound 2.5MG /0.5ML pen-injectors  Form [employer] Electronic [place] Form [phone] NCPDP)

## 2024-05-03 NOTE — Op Note (Signed)
   OPERATIVE REPORT Patient name: Maria Espinoza MRN: [id number] DOB: 07-17-1991  DOS: 05/03/24  Preop Dx: Postop Dx: same  Procedure:  1. ***  Surgeon: Thresa EMERSON Sar DPM  Anesthesia: 50-50 mixture of 2% lidocaine  plain with 0.5% Marcaine  plain totaling *** infiltrated in the patient's {RT/LT/BL}   Hemostasis: Calf tourniquet inflated to a pressure of after esmarch exsanguination   EBL: *** mL Materials: *** Injectables: *** Pathology: ***  Condition: The patient tolerated the procedure and anesthesia well. No complications noted or reported   Justification for procedure: The patient is a 33 y.o. @GENDER @ who presents today for surgical correction of ***. All conservative modalities of been unsuccessful in providing any sort of satisfactory alleviation of symptoms with the patient. The patient was told benefits as well as possible side effects of the surgery. The patient consented for surgical correction. The patient consent form was reviewed. All patient questions were answered. No guarantees were expressed or implied. The patient and the surgeon both signed the patient consent form with the witness present and placed in the patient's chart.   Procedure in Detail: The patient was brought to the operating room, placed in the operating table in the supine position at which time an aseptic scrub and drape were performed about the patient's respective lower extremity after anesthesia was induced as described above. Attention was then directed to the surgical area where procedure number one commenced.  Procedure #1: Harvesting of calcaneal autograft right  Procedure #2: Removal of hardware right fourth metatarsal  Procedure #3: Revision of nonunion fourth metatarsal right  Dry sterile compressive dressings were then applied to all previously mentioned incision sites about the patient's lower extremity. The tourniquet which was used for hemostasis was deflated. All normal  neurovascular responses including pink color and warmth returned all the digits of patient's lower extremity.  The patient was then transferred from the operating room to the recovery room having tolerated the procedure and anesthesia well. All vital signs are stable. After a brief stay in the recovery room the patient was readmitted to inpatient room with postoperative orders placed.    IMPRESSION:   Thresa EMERSON Sar, DPM [facility]  Dr. Thresa EMERSON Sar, DPM    [address] [place], [place] [address]                Office [phone]  Fax [phone]

## 2024-05-03 NOTE — Transfer of Care (Signed)
 Immediate Anesthesia Transfer of Care Note  Patient: Maria Espinoza  Procedure(s) Performed: CLOSED REDUCTION, FRACTURE, METATARSAL BONE (Right: Toe) PROCEDURE, BONE GRAFT (Right)  Patient Location: PACU  Anesthesia Type:General  Level of Consciousness: drowsy  Airway & Oxygen Therapy: Patient Spontanous Breathing and Patient connected to face mask oxygen  Post-op Assessment: Report given to RN  Post vital signs: stable  Last Vitals:  Vitals Value Taken Time  BP 129/67 05/03/24 17:56  Temp 36.2 C 05/03/24 17:56  Pulse 94 05/03/24 17:57  Resp 18 05/03/24 17:57  SpO2 99 % 05/03/24 17:57  Vitals shown include unfiled device data.  Last Pain:  Vitals:   05/03/24 1312  TempSrc: Temporal  PainSc: 0-No pain         Complications: No notable events documented.

## 2024-05-06 ENCOUNTER — Encounter: Payer: Self-pay | Admitting: Podiatry

## 2024-05-10 ENCOUNTER — Ambulatory Visit (INDEPENDENT_AMBULATORY_CARE_PROVIDER_SITE_OTHER)

## 2024-05-10 ENCOUNTER — Encounter: Payer: Self-pay | Admitting: Podiatry

## 2024-05-10 ENCOUNTER — Ambulatory Visit (INDEPENDENT_AMBULATORY_CARE_PROVIDER_SITE_OTHER): Admitting: Podiatry

## 2024-05-10 VITALS — Ht 63.0 in | Wt 309.0 lb

## 2024-05-10 DIAGNOSIS — S92301K Fracture of unspecified metatarsal bone(s), right foot, subsequent encounter for fracture with nonunion: Secondary | ICD-10-CM | POA: Diagnosis not present

## 2024-05-10 MED ORDER — OXYCODONE-ACETAMINOPHEN 5-325 MG PO TABS: 1.0000 | ORAL_TABLET | ORAL | 0 refills | Status: DC | PRN

## 2024-05-10 NOTE — Progress Notes (Signed)
 Chief Complaint  Patient presents with   Routine Post Op    POV # 1 DOS 05/03/24 -RT FOOT HARDWARE REMOVAL, CALCANEAL BONE AUTHGRAFT RIGHT, REVISION OF NON UNION 4TH RT METATARSAL pt states everything is going good, has some pain but knows that is expected, no other complaints.    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  DOS: 05/03/2024.  Doing well.  NWB as instructed in the cam boot.  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 1 run SVT x 32m 21s, max rate 190-->assoc w/ Ss.   Rectal bleeding    Sleep apnea    Suicide attempt by drug overdose (HCC) 09/30/2018   a.) intentionally took a whole bottle of doxepin (# pills unknown)   Syncope and collapse    a. 06/2023 Echo: EF 60-65%, no rwma, nl RV fxn, RVSP 32.5 mmHg. Mild MR, mild-mod TR; b.05/2023 Zio: Predominant sinus rhythm (40-190, average 78). 1 run SVT x 72m 21s, max rate 190-->assoc w/ Ss.    Past Surgical History:  Procedure Laterality Date   CLOSED REDUCTION METATARSAL Right 05/03/2024   Procedure: CLOSED REDUCTION, FRACTURE, METATARSAL BONE;  Surgeon: Janit Thresa HERO, DPM;  Location: [facility];  Service: Orthopedics/Podiatry;  Laterality: Right;  REVISION NONUNION RIGHT 4TH METATARSAL   HARDWARE REMOVAL Left 05/14/2021   Procedure: LEFT FOOT HARDWARE REMOVAL;  Surgeon: Janit Thresa HERO, DPM;  Location: [facility];  Service: Podiatry;  Laterality: Left;   HARDWARE REMOVAL Right 07/23/2021   Procedure: HARDWARE REMOVAL;  Surgeon: Janit Thresa HERO, DPM;  Location: [facility];  Service: Podiatry;   Laterality: Right;   HARVEST BONE GRAFT Right 05/03/2024   Procedure: PROCEDURE, BONE GRAFT;  Surgeon: Janit Thresa HERO, DPM;  Location: [facility];  Service: Orthopedics/Podiatry;  Laterality: Right;  CALCANIAL AUTOGRAFT   METATARSAL OSTEOTOMY Left 01/15/2021   Procedure: METATARSAL OSTEOTOMY;  Surgeon: Janit Thresa HERO, DPM;  Location: [facility];  Service: Podiatry;  Laterality: Left;   METATARSAL OSTEOTOMY Right 05/28/2021   Procedure: METATARSAL OSTEOTOMY FOURTH TOE RIGHT FOOT;  Surgeon: Janit Thresa HERO, DPM;  Location: [facility];  Service: Podiatry;  Laterality: Right;   METATARSAL OSTEOTOMY Right 05/27/2022   Procedure: METATARSAL OSTEOTOMY FOURTH TOE;  Surgeon: Janit Thresa HERO, DPM;  Location: [facility];  Service: Podiatry;  Laterality: Right;   TONSILLECTOMY     age 56 andoids   WISDOM TOOTH EXTRACTION      Allergies  Allergen Reactions   Latex Rash    Objective/Physical Exam Neurovascular status intact.  Incision well coapted with staples intact. No sign of infectious process noted. No dehiscence. No active bleeding noted.  Moderate edema noted to the surgical extremity.  Radiographic Exam RT foot 05/10/2024:  Void noted to the dorsal aspect of the posterior tubercle of the calcaneus from calcaneal bone autograft.  No acute fracture identified.  Orthopedic plate across the fourth metatarsal traversing the calcaneal autograft.  Arthritic change at the fourth MTP.  There is a broken screw also noted embedded within the  fourth metatarsal from prior plate and screw fixation surgery  Assessment: 1. 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 DOS: 05/03/2024   Plan of Care:  -Patient was evaluated. X-rays reviewed - Dressings changed.  Leave clean dry and intact x 1 week -Refill prescription for Percocet 5/325 mg every 4 hours as needed pain -Continue NWB in the cam boot with a knee scooter -Return to clinic 1 week   Thresa EMERSON Sar, DPM [facility]  Dr. Thresa EMERSON Sar, DPM    [address] [place], [place] [address]                Office [phone]  Fax [phone]

## 2024-05-15 ENCOUNTER — Ambulatory Visit: Admitting: Family Medicine

## 2024-05-17 ENCOUNTER — Encounter: Payer: Self-pay | Admitting: Podiatry

## 2024-05-17 ENCOUNTER — Ambulatory Visit (INDEPENDENT_AMBULATORY_CARE_PROVIDER_SITE_OTHER): Admitting: Podiatry

## 2024-05-17 VITALS — Ht 63.0 in | Wt 309.0 lb

## 2024-05-17 DIAGNOSIS — S92301K Fracture of unspecified metatarsal bone(s), right foot, subsequent encounter for fracture with nonunion: Secondary | ICD-10-CM

## 2024-05-17 MED ORDER — OXYCODONE-ACETAMINOPHEN 5-325 MG PO TABS: 1.0000 | ORAL_TABLET | Freq: Four times a day (QID) | ORAL | 0 refills | Status: DC | PRN

## 2024-05-17 NOTE — Progress Notes (Signed)
 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  DOS: 05/03/2024.  Doing well.  NWB as instructed in the cam boot.  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 1 run SVT x 11m 21s, max rate 190-->assoc w/ Ss.   Rectal bleeding    Sleep apnea    Suicide attempt by drug overdose (HCC) 09/30/2018   a.) intentionally took a whole bottle of doxepin (# pills unknown)   Syncope and collapse    a. 06/2023 Echo: EF 60-65%, no rwma, nl RV fxn, RVSP 32.5 mmHg. Mild MR, mild-mod TR; b.05/2023 Zio: Predominant sinus rhythm (40-190, average 78). 1 run SVT x 13m 21s, max rate 190-->assoc w/ Ss.    Past Surgical History:  Procedure Laterality Date   CLOSED REDUCTION METATARSAL Right 05/03/2024   Procedure: CLOSED REDUCTION, FRACTURE, METATARSAL BONE;  Surgeon: Janit Thresa HERO, DPM;  Location: [facility];  Service: Orthopedics/Podiatry;  Laterality: Right;  REVISION NONUNION RIGHT 4TH METATARSAL   HARDWARE REMOVAL Left 05/14/2021   Procedure: LEFT FOOT HARDWARE REMOVAL;  Surgeon: Janit Thresa HERO, DPM;  Location: [facility];  Service: Podiatry;  Laterality: Left;   HARDWARE REMOVAL Right 07/23/2021   Procedure: HARDWARE REMOVAL;  Surgeon: Janit Thresa HERO, DPM;  Location: [facility];  Service: Podiatry;  Laterality: Right;    HARVEST BONE GRAFT Right 05/03/2024   Procedure: PROCEDURE, BONE GRAFT;  Surgeon: Janit Thresa HERO, DPM;  Location: [facility];  Service: Orthopedics/Podiatry;  Laterality: Right;  CALCANIAL AUTOGRAFT   METATARSAL OSTEOTOMY Left 01/15/2021   Procedure: METATARSAL OSTEOTOMY;  Surgeon: Janit Thresa HERO, DPM;  Location: [facility];  Service: Podiatry;  Laterality: Left;   METATARSAL OSTEOTOMY Right 05/28/2021   Procedure: METATARSAL OSTEOTOMY FOURTH TOE RIGHT FOOT;  Surgeon: Janit Thresa HERO, DPM;  Location: [facility];  Service: Podiatry;  Laterality: Right;   METATARSAL OSTEOTOMY Right 05/27/2022   Procedure: METATARSAL OSTEOTOMY FOURTH TOE;  Surgeon: Janit Thresa HERO, DPM;  Location: [facility];  Service: Podiatry;  Laterality: Right;   TONSILLECTOMY     age 33 andoids   WISDOM TOOTH EXTRACTION      Allergies  Allergen Reactions   Latex Rash    Objective/Physical Exam Neurovascular status intact.  Incision well coapted with staples intact. No sign of infectious process noted. No dehiscence. No active bleeding noted.  Minimal edema noted to the surgical extremity.  Radiographic Exam RT foot 05/10/2024:  Void noted to the dorsal aspect of the posterior tubercle of the calcaneus from calcaneal bone autograft.  No acute fracture identified.  Orthopedic plate across the fourth metatarsal traversing the calcaneal autograft.  Arthritic change at the fourth MTP.  There is a broken screw also noted embedded within the fourth metatarsal from  prior plate and screw fixation surgery  Assessment: 1. 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 DOS: 05/03/2024   Plan of Care:  -Patient was evaluated.  -Refill prescription for Percocet 5/325 mg every 4 hours as needed pain -Continue NWB in the cam boot with a knee scooter -Return to clinic 1 week for staple removal   Thresa EMERSON Sar, DPM [facility]  Dr. Thresa EMERSON Sar, DPM    [address] [place], [place] [address]                Office [phone]  Fax [phone]

## 2024-05-24 ENCOUNTER — Telehealth: Payer: Self-pay | Admitting: Podiatry

## 2024-05-24 ENCOUNTER — Other Ambulatory Visit: Payer: Self-pay | Admitting: Podiatry

## 2024-05-24 MED ORDER — OXYCODONE-ACETAMINOPHEN 5-325 MG PO TABS: 1.0000 | ORAL_TABLET | Freq: Four times a day (QID) | ORAL | 0 refills | Status: DC | PRN

## 2024-05-24 NOTE — Telephone Encounter (Signed)
 Refill on pain medication Oxcycodone 5 mg best contact # for pt. [phone]

## 2024-05-31 ENCOUNTER — Telehealth: Payer: Self-pay | Admitting: Podiatry

## 2024-05-31 MED ORDER — OXYCODONE-ACETAMINOPHEN 5-325 MG PO TABS: 1.0000 | ORAL_TABLET | Freq: Four times a day (QID) | ORAL | 0 refills | Status: DC | PRN

## 2024-05-31 NOTE — Telephone Encounter (Signed)
 Refill sent to pharmacy.

## 2024-05-31 NOTE — Telephone Encounter (Signed)
 Pt needs a refill for pain medication Oxycod/acetamin 5- 325 [phone]

## 2024-06-04 ENCOUNTER — Encounter: Payer: Self-pay | Admitting: Podiatry

## 2024-06-04 ENCOUNTER — Ambulatory Visit (INDEPENDENT_AMBULATORY_CARE_PROVIDER_SITE_OTHER)

## 2024-06-04 ENCOUNTER — Ambulatory Visit (INDEPENDENT_AMBULATORY_CARE_PROVIDER_SITE_OTHER): Admitting: Podiatry

## 2024-06-04 VITALS — Ht 63.0 in | Wt 309.0 lb

## 2024-06-04 DIAGNOSIS — S92301K Fracture of unspecified metatarsal bone(s), right foot, subsequent encounter for fracture with nonunion: Secondary | ICD-10-CM | POA: Diagnosis not present

## 2024-06-04 MED ORDER — OXYCODONE-ACETAMINOPHEN 5-325 MG PO TABS: 1.0000 | ORAL_TABLET | Freq: Four times a day (QID) | ORAL | 0 refills | Status: DC | PRN

## 2024-06-04 NOTE — Progress Notes (Signed)
 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  DOS: 05/03/2024.  Doing well.  NWB as instructed in the cam boot.  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 1 run SVT x 7m 21s, max rate 190-->assoc w/ Ss.   Rectal bleeding    Sleep apnea    Suicide attempt by drug overdose (HCC) 09/30/2018   a.) intentionally took a whole bottle of doxepin (# pills unknown)   Syncope and collapse    a. 06/2023 Echo: EF 60-65%, no rwma, nl RV fxn, RVSP 32.5 mmHg. Mild MR, mild-mod TR; b.05/2023 Zio: Predominant sinus rhythm (40-190, average 78). 1 run SVT x 98m 21s, max rate 190-->assoc w/ Ss.    Past Surgical History:  Procedure Laterality Date   CLOSED REDUCTION METATARSAL Right 05/03/2024   Procedure: CLOSED REDUCTION, FRACTURE, METATARSAL BONE;  Surgeon: Janit Thresa HERO, DPM;  Location: [facility];  Service: Orthopedics/Podiatry;  Laterality: Right;  REVISION NONUNION RIGHT 4TH METATARSAL   HARDWARE REMOVAL Left 05/14/2021   Procedure: LEFT FOOT HARDWARE REMOVAL;  Surgeon: Janit Thresa HERO, DPM;  Location: [facility];  Service: Podiatry;  Laterality: Left;   HARDWARE REMOVAL Right 07/23/2021   Procedure: HARDWARE REMOVAL;  Surgeon: Janit Thresa HERO, DPM;  Location: [facility];  Service:  Podiatry;  Laterality: Right;   HARVEST BONE GRAFT Right 05/03/2024   Procedure: PROCEDURE, BONE GRAFT;  Surgeon: Janit Thresa HERO, DPM;  Location: [facility];  Service: Orthopedics/Podiatry;  Laterality: Right;  CALCANIAL AUTOGRAFT   METATARSAL OSTEOTOMY Left 01/15/2021   Procedure: METATARSAL OSTEOTOMY;  Surgeon: Janit Thresa HERO, DPM;  Location: [facility];  Service: Podiatry;  Laterality: Left;   METATARSAL OSTEOTOMY Right 05/28/2021   Procedure: METATARSAL OSTEOTOMY FOURTH TOE RIGHT FOOT;  Surgeon: Janit Thresa HERO, DPM;  Location: [facility];  Service: Podiatry;  Laterality: Right;   METATARSAL OSTEOTOMY Right 05/27/2022   Procedure: METATARSAL OSTEOTOMY FOURTH TOE;  Surgeon: Janit Thresa HERO, DPM;  Location: [facility];  Service: Podiatry;  Laterality: Right;   TONSILLECTOMY     age 33 andoids   WISDOM TOOTH EXTRACTION      Allergies  Allergen Reactions   Latex Rash    Objective/Physical Exam Overall healing very nicely.  Neurovascular status intact.  Incision well coapted with staples intact. No sign of infectious process noted. No dehiscence. No active bleeding noted.  Minimal edema noted to the surgical extremity.  Radiographic Exam RT foot 06/04/2024:  Essentially unchanged.  Routine healing noted.  The radiolucency noted to the dorsal aspect of the posterior tubercle of the calcaneus from calcaneal bone autograft appears to be closing in.  No acute fracture identified.  Orthopedic plate across the fourth metatarsal traversing the calcaneal  autograft.  Arthritic change at the fourth MTP.  There is a broken screw also noted embedded within the fourth metatarsal from prior plate and screw fixation surgery  Assessment: 1. 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 DOS: 05/03/2024   Plan of Care:  -Patient was evaluated.  X-rays reviewed -Refill prescription for Percocet 5/325 mg every 6 hours #28 as needed pain -Continue NWB in the cam boot with a knee scooter -Return to clinic 4  weeks follow-up x-ray   Thresa EMERSON Sar, DPM [facility]  Dr. Thresa EMERSON Sar, DPM    [address] [place], [place] [address]                Office [phone]  Fax [phone]

## 2024-06-07 ENCOUNTER — Ambulatory Visit: Admitting: Student

## 2024-06-07 ENCOUNTER — Ambulatory Visit: Admitting: Family Medicine

## 2024-06-17 ENCOUNTER — Other Ambulatory Visit: Payer: Self-pay | Admitting: Podiatry

## 2024-06-17 ENCOUNTER — Telehealth: Payer: Self-pay | Admitting: Podiatry

## 2024-06-17 MED ORDER — OXYCODONE-ACETAMINOPHEN 5-325 MG PO TABS: 1.0000 | ORAL_TABLET | Freq: Four times a day (QID) | ORAL | 0 refills | Status: DC | PRN

## 2024-06-17 NOTE — Telephone Encounter (Signed)
 Per Dr Janit RX sent over to pharmacy Sent pt Mychart message and left a voicemail.

## 2024-06-17 NOTE — Telephone Encounter (Signed)
 Pain is calling to get refill on Oxycodone -acetaminophen  (Percocet)

## 2024-06-17 NOTE — Telephone Encounter (Signed)
 Patient called asking for her refill of pain meds. She is out due to the holiday.

## 2024-06-19 NOTE — Progress Notes (Signed)
 Cardiology Clinic Note   Date: 06/21/2024 ID: Maria Espinoza, DOB May 14, 1991, MRN [id number]  Primary Cardiologist:  Redell Cave, MD  Chief Complaint   Maria Espinoza is a 33 y.o. female who presents to the clinic today for routine follow up.   Patient Profile   Maria Espinoza is followed by Maria Espinoza for the history outlined below.       Past medical history significant for: Syncope. Echo 06/27/2023: EF 60 to 65%.  No RWMA.  Normal diastolic parameters.  Normal RV size/function.  Normal PA pressure, RVSP 32.5 mmHg.  Mild MR.  Mild to moderate TR.  Mild AI without AS. Palpitations/SVT. 14-day ZIO 06/30/2023: HR 40 to 190 bpm, average 78 bpm.  Predominantly sinus rhythm.  1 run of SVT lasting 1 minute 21 seconds with max rate 190 bpm, average 171 bpm.  Second-degree AV block was present.  SVT detected within +/- 45 seconds of symptomatic patient event.  Rare ectopy.  No A-fib/a-flutter.  No other significant arrhythmias. Anxiety. Bipolar disorder. Tobacco abuse.  In summary, patient was first evaluated by Maria Espinoza in November 2024 for syncope.  Patient reported presyncopal and syncopal events occurring since age 89 sometimes associated with palpitations.  Events occurred exclusively while standing or on a few occasions while having a bowel movement.  Episodes are preceded by blurred vision, nausea and flushed feeling lasting 1 to 2 minutes followed by syncope if she is unable to lie down.  Echo demonstrated normal LV/RV function as detailed above.  2-week ZIO demonstrated 1 run of SVT as detailed above.  Patient was last seen in the office by Maria Meager, NP on 08/02/2023 for routine follow-up.  She was doing well at that time with no presyncopal or syncopal events.  She was referred to pulmonology for sleep study.  No other changes were made.  Patient presented to the ED on 09/11/2023 with report of palpitations described as heart racing, tightness in chest and  lightheadedness.  She took a dose of metoprolol  with no relief EKG demonstrated SVT, HR in the 160s.  Plan was to administer adenosine  however patient converted to sinus rhythm spontaneously.  Labs demonstrated WBC 10.9, hemoglobin 13.3, sodium 138, potassium 4, creatinine 0.95, BUN 14.  Troponin 55.  Patient maintained sinus rhythm throughout her stay in the ED.  Dr. Nancey reviewed EKG strips.  He felt slight increase in troponin secondary to elevated heart rate and serial troponins not indicated.  Patient was discharged home.  Patient contacted the office on 09/12/2023 to discuss ED visit.  She reported no further episodes of SVT since her discharge from the ED.  She was scheduled for follow-up visit.   Patient was last seen in the office by me on 09/22/2023 for hospital follow-up.  She reported 1 additional episode of palpitations lasting 15 minutes since hospital discharge.  Otherwise she was having daily palpitations lasting less than a minute 1-2 times a day at a minimum and sometimes more.  She reported associated syncope with episodes with last syncopal episode 3 months prior.  Toprol  tartrate 12.5 mg up to 2 times a day for breakthrough palpitations lasting > 10 minutes was added and she was referred to EP.  She also reported no weight loss on Wegovy  and was referred to healthy weight and wellness.     History of Present Illness    Today, patient is doing well overall. She is recovering from foot surgery. Unfortunately she has gained weight back due to inactivity as she  heals her foot. She reports continued palpitations and presyncope occurring about 1 time a week. She reports palpitations described as heart racing. If she is able to lay down and take metoprolol  tartrate she will not pass out. These episodes are unchanged from previous. She has not had a prolonged episode but she has had 1 additional syncopal episode since last visit. She is pending visit with Maria Espinoza.        ROS: All other  systems reviewed and are otherwise negative except as noted in History of Present Illness.  EKGs/Labs Reviewed    EKG Interpretation Date/Time:  Friday June 21 2024 14:44:26 EST Ventricular Rate:  79 PR Interval:  190 QRS Duration:  84 QT Interval:  396 QTC Calculation: 454 R Axis:   15  Text Interpretation: Normal sinus rhythm Normal ECG When compared with ECG of 29-Apr-2024 14:38, No significant change was found Confirmed by Loistine Sober [id number]) on 06/21/2024 2:48:08 PM   04/25/2024: ALT 14; AST 17 04/29/2024: BUN 13; Creatinine, Ser 0.97; Potassium 4.0; Sodium 138   04/29/2024: Hemoglobin 12.7; WBC 6.5   04/25/2024: TSH 2.130    Risk Assessment/Calculations          STOP-Bang Score:  4       Physical Exam    VS:  BP 135/87   Pulse 79   Ht 5' 3 (1.6 m)   Wt (!) 329 lb (149.2 kg)   SpO2 97%   BMI 58.28 kg/m  , BMI Body mass index is 58.28 kg/m.  GEN: Well nourished, well developed, in no acute distress. Neck: No JVD or carotid bruits. Cardiac:  RRR.  No murmur. No rubs or gallops.   Respiratory:  Respirations regular and unlabored. Clear to auscultation without rales, wheezing or rhonchi. GI: Soft, nontender, nondistended. Extremities: Radials/DP/PT 2+ and equal bilaterally. No clubbing or cyanosis. No edema  Skin: Warm and dry, no rash. Neuro: Strength intact.  Assessment & Plan   Palpitations/SVT/presyncope/syncope 14-day ZIO December 2024 showed 1 run of SVT lasting 1 minute 21 seconds with max rate of 190 bpm, SVT was detected within +/- 45 seconds of symptomatic patient event.  Patient reports continued palpitations and presyncope. As long as she can lay down she does not pass out. She states she will feel her heart racing and take a metoprolol  tartrate will good results. This is occurring about once a week.  - Continue Toprol  daily and metoprolol  tartrate for breakthrough episodes.  - Keep visit with Maria Espinoza on 08/27/2024.   Obesity Patient  was started on Wegovy  by Maria Espinoza. Unfortunately, she has gained weight secondary to being inactive while healing from foot surgery performed in October. She is on Zepbound  now.  - Continue Zepbound .   Disposition: Keep visit with Maria Espinoza 08/27/2024. Return in 6 months or sooner as needed.          Signed, Sober HERO. Maria Holton, DNP, NP-C

## 2024-06-21 ENCOUNTER — Ambulatory Visit: Attending: Student | Admitting: Student

## 2024-06-21 ENCOUNTER — Encounter: Payer: Self-pay | Admitting: Student

## 2024-06-21 ENCOUNTER — Ambulatory Visit: Admitting: Student

## 2024-06-21 VITALS — BP 135/87 | HR 79 | Ht 63.0 in | Wt 329.0 lb

## 2024-06-21 DIAGNOSIS — R55 Syncope and collapse: Secondary | ICD-10-CM | POA: Diagnosis not present

## 2024-06-21 DIAGNOSIS — I471 Supraventricular tachycardia, unspecified: Secondary | ICD-10-CM | POA: Diagnosis not present

## 2024-06-21 DIAGNOSIS — R002 Palpitations: Secondary | ICD-10-CM

## 2024-06-21 NOTE — Patient Instructions (Signed)
 Medication Instructions:   Your physician recommends that you continue on your current medications as directed. Please refer to the Current Medication list given to you today.    *If you need a refill on your cardiac medications before your next appointment, please call your pharmacy*  Lab Work:  None ordered at this time   If you have labs (blood work) drawn today and your tests are completely normal, you will receive your results only by:  MyChart Message (if you have MyChart) OR  A paper copy in the mail If you have any lab test that is abnormal or we need to change your treatment, we will call you to review the results.  Testing/Procedures:  None ordered at this time   Referrals:  None ordered at this time   Follow-Up:  At [facility], you and your health needs are our priority.  As part of our continuing mission to provide you with exceptional heart care, our providers are all part of one team.  This team includes your primary Cardiologist (physician) and Advanced Practice Providers or APPs (Physician Assistants and Nurse Practitioners) who all work together to provide you with the care you need, when you need it.  Your next appointment:   5 - 6 month(s)  Provider:    Barnie Hila, NP    We recommend signing up for the patient portal called MyChart.  Sign up information is provided on this After Visit Summary.  MyChart is used to connect with patients for Virtual Visits (Telemedicine).  Patients are able to view lab/test results, encounter notes, upcoming appointments, etc.  Non-urgent messages can be sent to your provider as well.   To learn more about what you can do with MyChart, go to [email or web address].

## 2024-06-23 ENCOUNTER — Other Ambulatory Visit: Payer: Self-pay | Admitting: Family Medicine

## 2024-06-23 DIAGNOSIS — G4733 Obstructive sleep apnea (adult) (pediatric): Secondary | ICD-10-CM

## 2024-06-24 ENCOUNTER — Telehealth: Payer: Self-pay

## 2024-06-24 ENCOUNTER — Telehealth: Payer: Self-pay | Admitting: Podiatry

## 2024-06-24 ENCOUNTER — Other Ambulatory Visit: Payer: Self-pay | Admitting: Podiatry

## 2024-06-24 MED ORDER — OXYCODONE-ACETAMINOPHEN 5-325 MG PO TABS: 1.0000 | ORAL_TABLET | Freq: Four times a day (QID) | ORAL | 0 refills | Status: DC | PRN

## 2024-06-24 NOTE — Telephone Encounter (Signed)
 Copied from CRM #[id number]. Topic: Clinical - Medication Question >> Jun 24, 2024 12:47 PM Lonell PEDLAR wrote: Reason for CRM: Patient is requesting clarification on rx for tirzepatide  (ZEPBOUND ) 2.5 MG/0.5ML Pen, she would like to increase dosing to 5mg .  Patient has last dose this week and would like to clarify this prior to getting rx refill.  C/b: [phone]

## 2024-06-24 NOTE — Telephone Encounter (Signed)
 Patient is requesting refill on pain medication (Oxycodone -acetaminophen  (Percocet) 5-325 mg tablet. Patient contact# [phone]

## 2024-06-25 ENCOUNTER — Other Ambulatory Visit: Payer: Self-pay | Admitting: Family Medicine

## 2024-06-25 DIAGNOSIS — G4733 Obstructive sleep apnea (adult) (pediatric): Secondary | ICD-10-CM

## 2024-06-25 MED ORDER — ZEPBOUND 5 MG/0.5ML ~~LOC~~ SOAJ: 5.0000 mg | SUBCUTANEOUS | 0 refills | Status: AC

## 2024-07-02 ENCOUNTER — Telehealth: Payer: Self-pay | Admitting: Podiatry

## 2024-07-02 NOTE — Telephone Encounter (Signed)
 Patient called requesting a refill of pain medication

## 2024-07-05 ENCOUNTER — Ambulatory Visit: Admitting: Family Medicine

## 2024-07-05 ENCOUNTER — Ambulatory Visit

## 2024-07-05 ENCOUNTER — Telehealth: Payer: Self-pay | Admitting: Podiatry

## 2024-07-05 ENCOUNTER — Encounter: Payer: Self-pay | Admitting: Podiatry

## 2024-07-05 ENCOUNTER — Telehealth: Payer: Self-pay

## 2024-07-05 ENCOUNTER — Encounter: Payer: Self-pay | Admitting: Family Medicine

## 2024-07-05 ENCOUNTER — Ambulatory Visit: Admitting: Podiatry

## 2024-07-05 VITALS — Ht 63.0 in | Wt 329.0 lb

## 2024-07-05 VITALS — BP 133/82 | HR 92 | Resp 16 | Ht 63.0 in | Wt 329.0 lb

## 2024-07-05 DIAGNOSIS — Z6841 Body Mass Index (BMI) 40.0 and over, adult: Secondary | ICD-10-CM | POA: Diagnosis not present

## 2024-07-05 DIAGNOSIS — G4733 Obstructive sleep apnea (adult) (pediatric): Secondary | ICD-10-CM | POA: Diagnosis not present

## 2024-07-05 DIAGNOSIS — S92301K Fracture of unspecified metatarsal bone(s), right foot, subsequent encounter for fracture with nonunion: Secondary | ICD-10-CM

## 2024-07-05 MED ORDER — OXYCODONE-ACETAMINOPHEN 5-325 MG PO TABS: 1.0000 | ORAL_TABLET | Freq: Three times a day (TID) | ORAL | 0 refills | Status: DC | PRN

## 2024-07-05 MED ORDER — ZEPBOUND 7.5 MG/0.5ML ~~LOC~~ SOAJ: 7.5000 mg | SUBCUTANEOUS | 1 refills | Status: AC

## 2024-07-05 NOTE — Progress Notes (Signed)
 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  DOS: 05/03/2024.  Doing well.  NWB as instructed in the cam boot.  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 1 run SVT x 31m 21s, max rate 190-->assoc w/ Ss.   Rectal bleeding    Sleep apnea    Suicide attempt by drug overdose (HCC) 09/30/2018   a.) intentionally took a whole bottle of doxepin (# pills unknown)   Syncope and collapse    a. 06/2023 Echo: EF 60-65%, no rwma, nl RV fxn, RVSP 32.5 mmHg. Mild MR, mild-mod TR; b.05/2023 Zio: Predominant sinus rhythm (40-190, average 78). 1 run SVT x 24m 21s, max rate 190-->assoc w/ Ss.    Past Surgical History:  Procedure Laterality Date   CLOSED REDUCTION METATARSAL Right 05/03/2024   Procedure: CLOSED REDUCTION, FRACTURE, METATARSAL BONE;  Surgeon: Janit Thresa HERO, DPM;  Location: [facility];  Service: Orthopedics/Podiatry;  Laterality: Right;  REVISION NONUNION RIGHT 4TH METATARSAL   HARDWARE REMOVAL Left 05/14/2021   Procedure: LEFT FOOT HARDWARE REMOVAL;  Surgeon: Janit Thresa HERO, DPM;  Location: [facility];  Service: Podiatry;  Laterality: Left;   HARDWARE REMOVAL Right 07/23/2021   Procedure: HARDWARE REMOVAL;  Surgeon: Janit Thresa HERO, DPM;  Location: [facility];  Service: Podiatry;  Laterality: Right;   HARVEST BONE GRAFT Right  05/03/2024   Procedure: PROCEDURE, BONE GRAFT;  Surgeon: Janit Thresa HERO, DPM;  Location: [facility];  Service: Orthopedics/Podiatry;  Laterality: Right;  CALCANIAL AUTOGRAFT   METATARSAL OSTEOTOMY Left 01/15/2021   Procedure: METATARSAL OSTEOTOMY;  Surgeon: Janit Thresa HERO, DPM;  Location: [facility];  Service: Podiatry;  Laterality: Left;   METATARSAL OSTEOTOMY Right 05/28/2021   Procedure: METATARSAL OSTEOTOMY FOURTH TOE RIGHT FOOT;  Surgeon: Janit Thresa HERO, DPM;  Location: [facility];  Service: Podiatry;  Laterality: Right;   METATARSAL OSTEOTOMY Right 05/27/2022   Procedure: METATARSAL OSTEOTOMY FOURTH TOE;  Surgeon: Janit Thresa HERO, DPM;  Location: [facility];  Service: Podiatry;  Laterality: Right;   TONSILLECTOMY     age 33 andoids   WISDOM TOOTH EXTRACTION      Allergies  Allergen Reactions   Latex Rash    Objective/Physical Exam Overall healing very nicely.  Neurovascular status intact.  Incision well coapted with staples intact. No sign of infectious process noted. No dehiscence. No active bleeding noted.  Minimal edema noted to the surgical extremity.  Radiographic Exam RT foot 07/05/2024:  Stable.  Thank you unchanged.   The radiolucency noted to the dorsal aspect of the posterior tubercle of the calcaneus from calcaneal bone autograft appears to be closing in.  No acute fracture identified.  Orthopedic plate across the fourth metatarsal traversing the calcaneal autograft.  Arthritic change at the fourth MTP.  There is  a broken screw also noted embedded within the fourth metatarsal from prior plate and screw fixation surgery  Assessment: 1. 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 DOS: 05/03/2024   Plan of Care:  -Patient was evaluated.  X-rays reviewed -Stressed the importance of reducing her smoking to promote healing -Continue external bone stimulator daily -Refill prescription for Percocet 5/325 mg every 8 hours #28 as needed pain - Patient has now been  nonweightbearing in the cam boot for the last 2 months.  Okay to begin small amount of weightbearing beginning short distances around the house can slowly increase over the next 6 weeks -Return to clinic 6 weeks follow-up x-ray   Thresa EMERSON Sar, DPM [facility]  Dr. Thresa EMERSON Sar, DPM    [address] [place], [place] [address]                Office [phone]  Fax [phone]
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
## 2024-07-05 NOTE — Telephone Encounter (Signed)
 See notes.

## 2024-07-05 NOTE — Telephone Encounter (Signed)
 Patient is stuck in traffic and would like to know if you will still her or do you want her to reschedule appointment. Patient arrival time is 8:35

## 2024-07-09 ENCOUNTER — Telehealth: Payer: Self-pay | Admitting: Podiatry

## 2024-07-09 NOTE — Telephone Encounter (Signed)
 Patient is requesting refill for oxycodone -acetaminophen (Percocet), Patient is not completely out of medication, will need medication by Friday

## 2024-07-12 ENCOUNTER — Other Ambulatory Visit: Payer: Self-pay | Admitting: Podiatry

## 2024-07-12 MED ORDER — OXYCODONE-ACETAMINOPHEN 5-325 MG PO TABS: 1.0000 | ORAL_TABLET | Freq: Three times a day (TID) | ORAL | 0 refills | Status: DC | PRN

## 2024-07-16 ENCOUNTER — Telehealth: Payer: Self-pay | Admitting: Sleep Medicine

## 2024-07-16 NOTE — Telephone Encounter (Signed)
 We received a note from [facility] chosen by [facility]. They couldn't process the cpap order due to the patient's AHI being too low [3.8]. The patient is interested in an in lab diagnostic sleep study. The patient would need to meet in office to discuss having another sleep study

## 2024-07-17 ENCOUNTER — Telehealth: Payer: Self-pay | Admitting: Lab

## 2024-07-17 DIAGNOSIS — S92301K Fracture of unspecified metatarsal bone(s), right foot, subsequent encounter for fracture with nonunion: Secondary | ICD-10-CM

## 2024-07-17 MED ORDER — OXYCODONE-ACETAMINOPHEN 5-325 MG PO TABS: 1.0000 | ORAL_TABLET | Freq: Four times a day (QID) | ORAL | 0 refills | Status: AC | PRN

## 2024-07-17 NOTE — Telephone Encounter (Signed)
 Patient states called before Christmas for refill on pain medication and pharmacy never received it can we refill before end of day she is in pain and needs something please review and advise.

## 2024-07-17 NOTE — Telephone Encounter (Signed)
 Refill of medication sent in. Prior rx not received by pharmacy

## 2024-07-17 NOTE — Telephone Encounter (Signed)
 Patient called states she is waiting for Dr Janit to call in a refill on her pain meds.

## 2024-07-22 NOTE — Telephone Encounter (Signed)
LVMTCB or check mychart

## 2024-07-30 ENCOUNTER — Telehealth: Payer: Self-pay

## 2024-07-30 ENCOUNTER — Telehealth: Payer: Self-pay | Admitting: Family Medicine
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
## 2024-07-30 NOTE — Telephone Encounter (Signed)
 Copied from CRM [id number]. Topic: Appointments - Scheduling Inquiry for Clinic >> Jul 30, 2024 10:09 AM Olam RAMAN wrote: Reason for CRM: pt would like ot know why she is no longer a pt at this location:  cb [phone]
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
## 2024-07-30 NOTE — Telephone Encounter (Signed)
 Copied from CRM #[id number]. Topic: Appointments - Scheduling Inquiry for Clinic >> Jul 30, 2024  9:58 AM Harlene ORN wrote: Reason for CRM: Patient called. Wants to schedule an appointment to see her PCP Dr. Towana to discuss weightloss medication. Her Zepbound  is no longer covered by her insurance. Would like to discuss alternatives.

## 2024-08-01 ENCOUNTER — Telehealth: Payer: Self-pay

## 2024-08-01 NOTE — Telephone Encounter (Signed)
 Copied from CRM [id number]. Topic: Medical Record Request - Records Request >> Aug 01, 2024  1:20 PM Olam RAMAN wrote: Reason for CRM: Pt would like medical records since she Is no longer a pt at clinic Cb: [phone]    Lvm for patient. HIM contact information [phone], fax [phone], email [email or web address]

## 2024-08-16 ENCOUNTER — Ambulatory Visit: Admitting: Podiatry

## 2024-08-23 ENCOUNTER — Ambulatory Visit: Admitting: Sleep Medicine

## 2024-08-27 ENCOUNTER — Ambulatory Visit: Admitting: Podiatry

## 2024-08-27 ENCOUNTER — Ambulatory Visit: Admitting: Cardiology
